# Patient Record
Sex: Female | Born: 1940 | Race: White | Hispanic: No | Marital: Single | Smoking: Never smoker
Health system: Southern US, Community
[De-identification: ages and names within clinical notes are randomized; demographics above are authoritative.]

## PROBLEM LIST (undated history)

## (undated) DIAGNOSIS — I471 Supraventricular tachycardia, unspecified: Secondary | ICD-10-CM

## (undated) DIAGNOSIS — I4891 Unspecified atrial fibrillation: Secondary | ICD-10-CM

## (undated) DIAGNOSIS — C801 Malignant (primary) neoplasm, unspecified: Secondary | ICD-10-CM

## (undated) DIAGNOSIS — I10 Essential (primary) hypertension: Secondary | ICD-10-CM

## (undated) DIAGNOSIS — M1990 Unspecified osteoarthritis, unspecified site: Secondary | ICD-10-CM

## (undated) DIAGNOSIS — L570 Actinic keratosis: Secondary | ICD-10-CM

## (undated) DIAGNOSIS — H409 Unspecified glaucoma: Secondary | ICD-10-CM

## (undated) DIAGNOSIS — T7840XA Allergy, unspecified, initial encounter: Secondary | ICD-10-CM

## (undated) HISTORY — PX: TOOTH EXTRACTION: SUR596

## (undated) HISTORY — DX: Allergy, unspecified, initial encounter: T78.40XA

## (undated) HISTORY — PX: EYE SURGERY: SHX253

## (undated) HISTORY — DX: Unspecified glaucoma: H40.9

## (undated) HISTORY — DX: Unspecified atrial fibrillation: I48.91

## (undated) HISTORY — DX: Actinic keratosis: L57.0

## (undated) HISTORY — DX: Supraventricular tachycardia, unspecified: I47.10

## (undated) HISTORY — DX: Unspecified osteoarthritis, unspecified site: M19.90

## (undated) HISTORY — PX: TONSILLECTOMY: SUR1361

## (undated) HISTORY — DX: Essential (primary) hypertension: I10

---

## 2005-04-22 ENCOUNTER — Ambulatory Visit: Payer: Self-pay | Admitting: Family Medicine

## 2005-05-24 ENCOUNTER — Ambulatory Visit: Payer: Self-pay | Admitting: Family Medicine

## 2005-06-22 ENCOUNTER — Ambulatory Visit: Payer: Self-pay | Admitting: Family Medicine

## 2005-06-22 ENCOUNTER — Other Ambulatory Visit: Admission: RE | Admit: 2005-06-22 | Discharge: 2005-06-22 | Payer: Self-pay | Admitting: Family Medicine

## 2005-06-22 ENCOUNTER — Encounter: Payer: Self-pay | Admitting: Family Medicine

## 2005-07-19 ENCOUNTER — Ambulatory Visit: Payer: Self-pay | Admitting: Family Medicine

## 2005-09-21 ENCOUNTER — Ambulatory Visit: Payer: Self-pay | Admitting: Family Medicine

## 2005-11-28 ENCOUNTER — Ambulatory Visit: Payer: Self-pay | Admitting: Family Medicine

## 2005-12-22 ENCOUNTER — Ambulatory Visit: Payer: Self-pay | Admitting: Family Medicine

## 2006-06-21 ENCOUNTER — Ambulatory Visit: Payer: Self-pay | Admitting: Family Medicine

## 2006-10-03 ENCOUNTER — Ambulatory Visit: Payer: Self-pay | Admitting: Family Medicine

## 2006-10-05 ENCOUNTER — Ambulatory Visit: Payer: Self-pay | Admitting: Family Medicine

## 2006-10-05 LAB — CONVERTED CEMR LAB
AST: 28 units/L (ref 0–37)
Albumin: 4 g/dL (ref 3.5–5.2)
Basophils Absolute: 0 10*3/uL (ref 0.0–0.1)
Basophils Relative: 0.2 % (ref 0.0–1.0)
Bilirubin, Direct: 0.1 mg/dL (ref 0.0–0.3)
Chloride: 105 meq/L (ref 96–112)
Cholesterol: 232 mg/dL (ref 0–200)
Creatinine, Ser: 0.8 mg/dL (ref 0.4–1.2)
Direct LDL: 133.9 mg/dL
Glucose, Bld: 92 mg/dL (ref 70–99)
HDL: 72.2 mg/dL (ref >39.0)
Lymphocytes Relative: 28.8 % (ref 12.0–46.0)
Monocytes Relative: 8.1 % (ref 3.0–11.0)
Platelets: 243 10*3/uL (ref 150–400)
Potassium: 4 meq/L (ref 3.5–5.1)
Sodium: 141 meq/L (ref 135–145)
TSH: 2.54 microintl units/mL (ref 0.35–5.50)
Total Bilirubin: 0.5 mg/dL (ref 0.3–1.2)
Total CHOL/HDL Ratio: 3.2
Total Protein: 6.4 g/dL (ref 6.0–8.3)
Triglycerides: 69 mg/dL (ref 0–149)
VLDL: 14 mg/dL (ref 0–40)

## 2006-10-24 LAB — FECAL OCCULT BLOOD, GUAIAC: Fecal Occult Blood: NEGATIVE

## 2006-10-26 ENCOUNTER — Ambulatory Visit: Payer: Self-pay | Admitting: Family Medicine

## 2007-06-22 ENCOUNTER — Ambulatory Visit: Payer: Self-pay | Admitting: Family Medicine

## 2007-10-03 ENCOUNTER — Encounter: Payer: Self-pay | Admitting: Family Medicine

## 2007-10-03 DIAGNOSIS — E7800 Pure hypercholesterolemia, unspecified: Secondary | ICD-10-CM | POA: Insufficient documentation

## 2007-10-03 DIAGNOSIS — M810 Age-related osteoporosis without current pathological fracture: Secondary | ICD-10-CM | POA: Insufficient documentation

## 2007-10-03 DIAGNOSIS — I10 Essential (primary) hypertension: Secondary | ICD-10-CM | POA: Insufficient documentation

## 2007-10-03 DIAGNOSIS — H409 Unspecified glaucoma: Secondary | ICD-10-CM | POA: Insufficient documentation

## 2007-10-03 DIAGNOSIS — J309 Allergic rhinitis, unspecified: Secondary | ICD-10-CM | POA: Insufficient documentation

## 2007-10-03 DIAGNOSIS — M1990 Unspecified osteoarthritis, unspecified site: Secondary | ICD-10-CM | POA: Insufficient documentation

## 2007-10-11 ENCOUNTER — Ambulatory Visit: Payer: Self-pay | Admitting: Family Medicine

## 2007-10-12 LAB — CONVERTED CEMR LAB
ALT: 20 units/L (ref 0–35)
BUN: 16 mg/dL (ref 6–23)
Basophils Relative: 0.3 % (ref 0.0–1.0)
CO2: 32 meq/L (ref 19–32)
Creatinine, Ser: 0.8 mg/dL (ref 0.4–1.2)
Eosinophils Absolute: 0.2 10*3/uL (ref 0.0–0.6)
Eosinophils Relative: 2.4 % (ref 0.0–5.0)
Glucose, Bld: 100 mg/dL — ABNORMAL HIGH (ref 70–99)
HCT: 40.9 % (ref 36.0–46.0)
Hemoglobin: 13.8 g/dL (ref 12.0–15.0)
Lymphocytes Relative: 19.1 % (ref 12.0–46.0)
MCV: 91.5 fL (ref 78.0–100.0)
Monocytes Absolute: 0.9 10*3/uL — ABNORMAL HIGH (ref 0.2–0.7)
Neutro Abs: 4.1 10*3/uL (ref 1.4–7.7)
Neutrophils Relative %: 64.3 % (ref 43.0–77.0)
Phosphorus: 3.8 mg/dL (ref 2.3–4.6)
Platelets: 202 10*3/uL (ref 150–400)
Potassium: 3.6 meq/L (ref 3.5–5.1)
TSH: 2.04 microintl units/mL (ref 0.35–5.50)
VLDL: 10 mg/dL (ref 0–40)
WBC: 6.4 10*3/uL (ref 4.5–10.5)

## 2007-10-15 LAB — CONVERTED CEMR LAB: Vit D, 1,25-Dihydroxy: 40 (ref 30–89)

## 2007-10-16 ENCOUNTER — Ambulatory Visit: Payer: Self-pay | Admitting: Family Medicine

## 2007-10-16 LAB — CONVERTED CEMR LAB
Bacteria, UA: 0
Ketones, urine, test strip: NEGATIVE
Nitrite: NEGATIVE
Protein, U semiquant: NEGATIVE
Specific Gravity, Urine: 1.01

## 2007-11-22 ENCOUNTER — Encounter: Payer: Self-pay | Admitting: Family Medicine

## 2007-11-28 ENCOUNTER — Encounter (INDEPENDENT_AMBULATORY_CARE_PROVIDER_SITE_OTHER): Payer: Self-pay | Admitting: *Deleted

## 2008-06-02 ENCOUNTER — Ambulatory Visit: Payer: Self-pay | Admitting: Family Medicine

## 2008-12-18 ENCOUNTER — Encounter: Payer: Self-pay | Admitting: Family Medicine

## 2008-12-18 ENCOUNTER — Other Ambulatory Visit: Admission: RE | Admit: 2008-12-18 | Discharge: 2008-12-18 | Payer: Self-pay | Admitting: Family Medicine

## 2008-12-18 ENCOUNTER — Ambulatory Visit: Payer: Self-pay | Admitting: Family Medicine

## 2008-12-18 DIAGNOSIS — R002 Palpitations: Secondary | ICD-10-CM | POA: Insufficient documentation

## 2008-12-23 ENCOUNTER — Encounter (INDEPENDENT_AMBULATORY_CARE_PROVIDER_SITE_OTHER): Payer: Self-pay | Admitting: *Deleted

## 2008-12-23 ENCOUNTER — Ambulatory Visit: Payer: Self-pay | Admitting: Family Medicine

## 2008-12-24 LAB — CONVERTED CEMR LAB
AST: 25 units/L (ref 0–37)
Basophils Relative: 0.5 % (ref 0.0–3.0)
Creatinine, Ser: 0.8 mg/dL (ref 0.4–1.2)
Direct LDL: 145.4 mg/dL
Eosinophils Absolute: 0.1 10*3/uL (ref 0.0–0.7)
Eosinophils Relative: 2.4 % (ref 0.0–5.0)
Glucose, Bld: 90 mg/dL (ref 70–99)
Lymphocytes Relative: 32.7 % (ref 12.0–46.0)
Monocytes Relative: 9 % (ref 3.0–12.0)
Neutrophils Relative %: 55.4 % (ref 43.0–77.0)
Phosphorus: 4.9 mg/dL — ABNORMAL HIGH (ref 2.3–4.6)
Potassium: 4.1 meq/L (ref 3.5–5.1)
RBC: 4.33 M/uL (ref 3.87–5.11)
Sodium: 144 meq/L (ref 135–145)
Total CHOL/HDL Ratio: 4
VLDL: 15.4 mg/dL (ref 0.0–40.0)
WBC: 4.2 10*3/uL — ABNORMAL LOW (ref 4.5–10.5)

## 2008-12-25 ENCOUNTER — Ambulatory Visit: Payer: Self-pay | Admitting: Family Medicine

## 2008-12-25 LAB — CONVERTED CEMR LAB
OCCULT 1: NEGATIVE
OCCULT 2: NEGATIVE
OCCULT 3: NEGATIVE

## 2008-12-26 ENCOUNTER — Encounter (INDEPENDENT_AMBULATORY_CARE_PROVIDER_SITE_OTHER): Payer: Self-pay | Admitting: *Deleted

## 2008-12-26 LAB — CONVERTED CEMR LAB: Vit D, 25-Hydroxy: 43 ng/mL (ref 30–89)

## 2009-01-05 ENCOUNTER — Encounter: Payer: Self-pay | Admitting: Family Medicine

## 2009-01-13 ENCOUNTER — Encounter (INDEPENDENT_AMBULATORY_CARE_PROVIDER_SITE_OTHER): Payer: Self-pay | Admitting: *Deleted

## 2009-06-10 ENCOUNTER — Ambulatory Visit: Payer: Self-pay | Admitting: Family Medicine

## 2010-01-22 ENCOUNTER — Ambulatory Visit: Payer: Self-pay | Admitting: Family Medicine

## 2010-01-22 DIAGNOSIS — K589 Irritable bowel syndrome without diarrhea: Secondary | ICD-10-CM | POA: Insufficient documentation

## 2010-01-26 LAB — CONVERTED CEMR LAB
Albumin: 4.2 g/dL (ref 3.5–5.2)
Basophils Relative: 0.7 % (ref 0.0–3.0)
Chloride: 102 meq/L (ref 96–112)
Direct LDL: 171.9 mg/dL
Eosinophils Absolute: 0.1 10*3/uL (ref 0.0–0.7)
Hemoglobin: 13.3 g/dL (ref 12.0–15.0)
Lymphocytes Relative: 31.6 % (ref 12.0–46.0)
MCHC: 34.4 g/dL (ref 30.0–36.0)
Monocytes Relative: 11.7 % (ref 3.0–12.0)
Neutro Abs: 2.7 10*3/uL (ref 1.4–7.7)
Phosphorus: 3.9 mg/dL (ref 2.3–4.6)
Potassium: 3.8 meq/L (ref 3.5–5.1)
RBC: 4.27 M/uL (ref 3.87–5.11)
TSH: 3.11 microintl units/mL (ref 0.35–5.50)
VLDL: 30.4 mg/dL (ref 0.0–40.0)
Vit D, 25-Hydroxy: 47 ng/mL (ref 30–89)

## 2010-02-08 ENCOUNTER — Encounter: Payer: Self-pay | Admitting: Family Medicine

## 2010-02-08 LAB — HM MAMMOGRAPHY: HM Mammogram: NORMAL

## 2010-02-10 ENCOUNTER — Encounter (INDEPENDENT_AMBULATORY_CARE_PROVIDER_SITE_OTHER): Payer: Self-pay | Admitting: *Deleted

## 2010-03-11 ENCOUNTER — Encounter: Payer: Self-pay | Admitting: Family Medicine

## 2010-03-15 ENCOUNTER — Encounter: Payer: Self-pay | Admitting: Family Medicine

## 2010-03-15 ENCOUNTER — Ambulatory Visit: Payer: Self-pay | Admitting: Gastroenterology

## 2010-06-02 ENCOUNTER — Ambulatory Visit: Payer: Self-pay | Admitting: Ophthalmology

## 2010-07-07 ENCOUNTER — Ambulatory Visit: Payer: Self-pay | Admitting: Ophthalmology

## 2010-08-09 ENCOUNTER — Ambulatory Visit: Payer: Self-pay | Admitting: Family Medicine

## 2010-08-09 DIAGNOSIS — M79609 Pain in unspecified limb: Secondary | ICD-10-CM | POA: Insufficient documentation

## 2010-08-10 LAB — CONVERTED CEMR LAB
BUN: 24 mg/dL — ABNORMAL HIGH (ref 6–23)
Calcium: 9.6 mg/dL (ref 8.4–10.5)
GFR calc non Af Amer: 79.05 mL/min
Glucose, Bld: 69 mg/dL — ABNORMAL LOW (ref 70–99)
Potassium: 4.1 meq/L (ref 3.5–5.1)

## 2010-10-05 NOTE — Assessment & Plan Note (Signed)
Summary: RIGHT KNEE PAIN/CLE   Vital Signs:  Patient profile:   70 year old female Weight:      190.50 pounds Temp:     98.3 degrees F oral Pulse rate:   76 / minute Pulse rhythm:   regular BP sitting:   128 / 72  (left arm) Cuff size:   large  Vitals Entered By: Selena Batten Dance CMA Duncan Dull) (August 09, 2010 9:09 AM) CC: Right knee/leg pain   History of Present Illness: CC: R leg pain  2 wks ago woke her up from sleep, sharp pain, like "leg locked up in spasm".  Hurt posterior leg, started thigh region and travelled down to ankle, up to hips at times.  Inactivity brings it on.  Worse in AM when getting up to move leg.  90% better now.  Has tried advil and alleve.  h/o arthritis.  pain described as sharp throbbing then goes down to just ache.  Not really cramping.  No erythema, swelling or redness of leg.  No back pain.  + sometimes with muscle spasm.  No pain currently  No falls/injuries, no fevers/chills, no knee pain, other joint pains.  No SOB, CP/tightness.  No recent immobility.    Recent colonoscopy and mammogram normal this year.  -  Date:  06/09/2010    Flu vaccine given at work  Current Medications (verified): 1)  Ziac 2.5-6.25 Mg  Tabs (Bisoprolol-Hydrochlorothiazide) .... One By Mouth Once Daily 2)  Verapamil Hcl Cr 240 Mg  Cp24 (Verapamil Hcl) .... One By Mouth Daily 3)  Evista 60 Mg  Tabs (Raloxifene Hcl) .... Take One By Mouth Daily 4)  Timolol Maleate 0.5 %  Solg (Timolol Maleate) .... Use As Directed 5)  Adult Aspirin Ec Low Strength 81 Mg  Tbec (Aspirin) .... One By Mouth Daily 6)  Calcium Plus Vitamin D 600 Mg .... 2 By Mouth Qd 7)  Fish Oil 1000mg  .... 1 By Mouth Qd 8)  Daily Vitamins   Tabs (Multiple Vitamin) .... Take 1 Tablet By Mouth Once A Day 9)  Loratadine 10 Mg Tabs (Loratadine) .... Otc As Directed.  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  no midline tenderness or paraspinous mm tenderness.  No SI or GTB pain bilaterally.    R leg nontender throughout, no masses, deformity.  slight crepitus at knees.  No pain with int/ext rotation at hip, full flexion/extension of knee.  Neg homan's sign.   Pulses:  2+ pulses Extremities:  No evidence of superficial thrombophlebitis.  No swelling, warmth, erythema or edema bilaterally Neurologic:  sensation intact to light touch, gait normal.   Impression & Recommendations:  Problem # 1:  LEG PAIN, RIGHT (ICD-729.5) ? hamstring strain vs mild sciatic irritation.  check K to r/o hypokalemia as on HCTZ.  Advised conservative measures for now (NSAIDs, flexeril, ice/heat).  If not better, consider referral to PT.  If worsening, return for further eval.  No significant risk factors for DVT other than weight, no evidence of DVT  on exam.  no back or buttock pain.  Orders: TLB-BMP (Basic Metabolic Panel-BMET) (80048-METABOL)  Complete Medication List: 1)  Ziac 2.5-6.25 Mg Tabs (Bisoprolol-hydrochlorothiazide) .... One by mouth once daily 2)  Verapamil Hcl Cr 240 Mg Cp24 (Verapamil hcl) .... One by mouth daily 3)  Evista 60 Mg Tabs (Raloxifene hcl) .... Take one by mouth daily 4)  Timolol Maleate 0.5 % Solg (Timolol maleate) .... Use as directed 5)  Adult Aspirin Ec Low Strength 81 Mg Tbec (Aspirin) .... One by mouth daily 6)  Calcium Plus Vitamin D 600 Mg  .... 2 by mouth qd 7)  Fish Oil 1000mg   .... 1 by mouth qd 8)  Daily  Vitamins Tabs (Multiple vitamin) .... Take 1 tablet by mouth once a day 9)  Loratadine 10 Mg Tabs (Loratadine) .... Otc as directed. 10)  Flexeril 5 Mg Tabs (Cyclobenzaprine hcl) .... Take one by mouth two times a day as needed muscle spasm, sedation precautions  Patient Instructions: 1)  Could be hamstring strain or possibly sciatica irritation. 2)  Treat with continued NSAIDs (advil and alleve), heat or ice (whichever soothes better), and massage. 3)  If not improving with these measures, we may send you to PT. 4)  If worsening, please return to be seen again. 5)  Check potassium level today. Prescriptions: FLEXERIL 5 MG TABS (CYCLOBENZAPRINE HCL) take one by mouth two times a day as needed muscle spasm, sedation precautions  #30 x 0   Entered and Authorized by:   Eustaquio Boyden  MD   Signed by:   Eustaquio Boyden  MD on 08/09/2010   Method used:   Electronically to        [facility] [address] [address]* (retail)       [address]       [place], [place]  [address]       Ph: [phone]       Fax: [phone]   RxID:   [id number]    Orders Added: 1)  Est. Patient Level III [47425] 2)  TLB-BMP (Basic Metabolic Panel-BMET) [80048-METABOL]    Current Allergies (reviewed today): No known allergies

## 2010-10-05 NOTE — Procedures (Signed)
Summary: Colonoscopy by Dr.Paul [facility]  Colonoscopy by Dr.Paul Oh,[facility]   Imported By: Beau Fanny 03/17/2010 16:41:19  _____________________________________________________________________  External Attachment:    Type:   Image     Comment:   External Document  Appended Document: Colonoscopy by Dr.Paul [facility]    Clinical Lists Changes  Observations: Added new observation of COLONNXTDUE: 03/2020 (03/17/2010 21:23) Added new observation of COLONOSCOPY: normal (03/15/2010 21:23)       Preventive Care Screening  Colonoscopy:    Date:  03/15/2010    Next Due:  03/2020    Results:  normal

## 2010-10-05 NOTE — Consult Note (Signed)
Summary: [facility]  [facility]   Imported By: Lanelle Bal 03/23/2010 11:12:05  _____________________________________________________________________  External Attachment:    Type:   Image     Comment:   External Document

## 2010-10-05 NOTE — Assessment & Plan Note (Signed)
Summary: cpx/alc   Vital Signs:  Patient profile:   70 year old female Height:      63 inches Weight:      184.50 pounds BMI:     32.80 Temp:     97.9 degrees F oral Pulse rate:   64 / minute Pulse rhythm:   regular BP sitting:   136 / 86  (left arm) Cuff size:   regular  Vitals Entered By: Lewanda Rife LPN (Jan 22, 2010 9:43 AM) CC: check up - multiple probs    History of Present Illness: here for f/u of chronic medical problems and to [facility] mt list   has been feeling good  bowel movements are different -- some cramps / occ constipation and sometimes nl she does not know why occ eating out makes it worse with diarrhea  ? IBS   wt is up 4 lb  HTN - bp 136/86 today- fairly controlled  OP stable last ma with FN T score -2.81 on evista and ca and D no problems with evista -- and compliant with ca and D  colonosc 01-- is due for 10 year f/u  pap 4/10 - nl , no hx of sympt / abn paps or new partners   mam neg 5/10  no new changes on breast exa   Td 04  ?pneumovax-- never had pneumonia  ? zostavax -- called about that with insurance -- is unsure if either would be covered    Allergies (verified): No Known Drug Allergies  Past History:  Past Medical History: Last updated: 10/03/2007 Allergic rhinitis Hypertension Osteoarthritis Osteoporosis  Past Surgical History: Last updated: 10/03/2007 Tonsillectomy Stress test/ holter (2004) Colonoscopy  Family History: Last updated: 10/27/07 Father: died age 64- heart problems, MI, kidney cancer Mother: OP and dementia Siblings: brother with HTN MGF CAD [place] CAD MGM CVA PGM died of cancer  Social History: Last updated: 10/27/07 Marital Status: single Children: 2 daughters 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 Eyes:  Denies blurring and eye irritation. CV:  Denies chest pain or  discomfort, lightheadness, and palpitations. Resp:  Denies cough, shortness of breath, and wheezing. GI:  Complains of abdominal pain, change in bowel habits, constipation, and diarrhea; denies bloody stools, indigestion, and nausea. GU:  Denies dysuria and urinary frequency. Derm:  Denies lesion(s), poor wound healing, and rash. Neuro:  Denies numbness and tingling. Endo:  Denies cold intolerance and heat intolerance. Heme:  Denies abnormal bruising, bleeding, and enlarge lymph nodes.  Physical Exam  General:  overweight but generally well appearing  Head:  normocephalic, atraumatic, and no abnormalities observed.   Eyes:  vision grossly intact, pupils equal, pupils round, and pupils reactive to light.  no conjunctival pallor, injection or icterus  Ears:  R ear normal and L ear normal.   Nose:  no nasal discharge.   Mouth:  pharynx pink and moist.   Neck:  supple with full rom and no masses or thyromegally, no JVD or carotid bruit  Chest Wall:  No deformities, masses, or tenderness noted. Breasts:  No mass, nodules, thickening, tenderness, bulging, retraction, inflamation, nipple discharge or skin changes noted.   Lungs:  Normal respiratory effort, chest expands symmetrically. Lungs are clear to auscultation, no crackles or wheezes. Heart:  Normal rate and regular rhythm. S1 and S2 normal without gallop, murmur, click, rub or other extra sounds. Abdomen:  Bowel sounds positive,abdomen soft and  non-tender without masses, organomegaly or hernias noted. no renal bruits  Msk:  No deformity or scoliosis noted of thoracic or lumbar spine.  no acute joint changes Pulses:  R and L carotid,radial,femoral,dorsalis pedis and posterior tibial pulses are full and equal bilaterally Extremities:  No clubbing, cyanosis, edema, or deformity noted with normal full range of motion of all joints.   Neurologic:  sensation intact to light touch, gait normal, and DTRs symmetrical and normal.   Skin:  Intact  without suspicious lesions or rashes lentigos diffusely  Cervical Nodes:  No lymphadenopathy noted Axillary Nodes:  No palpable lymphadenopathy Inguinal Nodes:  No significant adenopathy Psych:  normal affect, talkative and pleasant    Impression & Recommendations:  Problem # 1:  OTHER SCREENING MAMMOGRAM (ICD-V76.12) Assessment Comment Only annual mammogram scheduled adv pt to continue regular self breast exams non remarkable breast exam today  Orders: Venipuncture ([address]) TLB-Lipid Panel (80061-LIPID) TLB-Renal Function Panel (80069-RENAL) TLB-CBC Platelet - w/Differential (85025-CBCD) TLB-ALT (SGPT) (84460-ALT) TLB-AST (SGOT) (84450-SGOT) TLB-TSH (Thyroid Stimulating Hormone) (84443-TSH) T-Vitamin D (25-Hydroxy) (52841-32440) Radiology Referral (Radiology) Prescription Created Electronically [id number])  Problem # 2:  HYPERCHOLESTEROLEMIA (ICD-272.0) Assessment: Unchanged  check lipids with diet  low sat fat diet and exercise reviewed  Orders: Venipuncture (53664) TLB-Lipid Panel (80061-LIPID) TLB-Renal Function Panel (80069-RENAL) TLB-CBC Platelet - w/Differential (85025-CBCD) TLB-ALT (SGPT) (84460-ALT) TLB-AST (SGOT) (84450-SGOT) TLB-TSH (Thyroid Stimulating Hormone) (84443-TSH) T-Vitamin D (25-Hydroxy) (40347-42595) Prescription Created Electronically [id number])  Labs Reviewed: SGOT: 25 (12/23/2008)   SGPT: 20 (12/23/2008)   HDL:64.80 (12/23/2008), 75.3 (10/11/2007)  LDL:DEL (10/11/2007), DEL (10/05/2006)  Chol:233 (12/23/2008), 212 (10/11/2007)  Trig:77.0 (12/23/2008), 51 (10/11/2007)  Problem # 3:  OSTEOPOROSIS (ICD-733.00) Assessment: Unchanged this is stable (dexa) wiht evista and ca and D  check D level urged to keep up exercise  Her updated medication list for this problem includes:    Evista 60 Mg Tabs (Raloxifene hcl) .Marland Kitchen... Take one by mouth daily  Orders: Venipuncture ([address]) TLB-Lipid Panel (80061-LIPID) TLB-Renal Function Panel  (80069-RENAL) TLB-CBC Platelet - w/Differential (85025-CBCD) TLB-ALT (SGPT) (84460-ALT) TLB-AST (SGOT) (84450-SGOT) TLB-TSH (Thyroid Stimulating Hormone) (84443-TSH) T-Vitamin D (25-Hydroxy) (95188-41660) Prescription Created Electronically [id number])  Problem # 4:  HYPERTENSION (ICD-401.9) Assessment: Unchanged  bp in fair control reminded to work on healthy habits and wt loss  sent px to pharm to file Her updated medication list for this problem includes:    Ziac 2.5-6.25 Mg Tabs (Bisoprolol-hydrochlorothiazide) ..... One by mouth once daily    Verapamil Hcl Cr 240 Mg Cp24 (Verapamil hcl) ..... One by mouth daily  Orders: Venipuncture (01093) TLB-Lipid Panel (80061-LIPID) TLB-Renal Function Panel (80069-RENAL) TLB-CBC Platelet - w/Differential (85025-CBCD) TLB-ALT (SGPT) (84460-ALT) TLB-AST (SGOT) (84450-SGOT) TLB-TSH (Thyroid Stimulating Hormone) (84443-TSH) T-Vitamin D (25-Hydroxy) (23557-32202) Prescription Created Electronically [id number])  BP today: 136/86 Prior BP: 138/80 (12/18/2008)  Labs Reviewed: K+: 4.1 (12/23/2008) Creat: : 0.8 (12/23/2008)   Chol: 233 (12/23/2008)   HDL: 64.80 (12/23/2008)   LDL: DEL (10/11/2007)   TG: 77.0 (12/23/2008)  Problem # 5:  IRRITABLE BOWEL SYNDROME (ICD-564.1) Assessment: New with intermittent urgent loose stool and constipation and cramps trial of fiber ref to GI for this and also to disc 10 y screen colonosc  handout given fromaafp as well to read  Orders: Gastroenterology Referral (GI) Prescription Created Electronically [id number])  Complete Medication List: 1)  Ziac 2.5-6.25 Mg Tabs (Bisoprolol-hydrochlorothiazide) .... One by mouth once daily 2)  Verapamil Hcl Cr 240 Mg Cp24 (Verapamil hcl) .... One by mouth daily 3)  Evista 60 Mg Tabs (Raloxifene hcl) .Marland KitchenMarland KitchenMarland Kitchen  Take one by mouth daily 4)  Timolol Maleate 0.5 % Solg (Timolol maleate) .... Use as directed 5)  Adult Aspirin Ec Low Strength 81 Mg Tbec (Aspirin) .... One by mouth  daily 6)  Calcium Plus Vitamin D 600 Mg  .... 2 by mouth qd 7)  Fish Oil 1000mg   .... 1 by mouth qd 8)  Daily Vitamins Tabs (Multiple vitamin) .... Take 1 tablet by mouth once a day 9)  Loratadine 10 Mg Tabs (Loratadine) .... Otc as directed.  Patient Instructions: 1)  call your insurance about pneumonia vaccine , and shingles vaccine  2)  we will do GI referral at check out  3)  we will do mammogram referral at check out  4)  take citrucel (fiber supplement over the counter) once daily with water as directed  5)  work on healthy diet and exercise  6)  labs today  Prescriptions: EVISTA 60 MG  TABS (RALOXIFENE HCL) take one by mouth daily  #90 x 3   Entered and Authorized by:   Judith Part MD   Signed by:   Judith Part MD on 01/22/2010   Method used:   Electronically to        [facility] [address] (retail)       [address]       [place], [place]  [address]       Ph: [phone]       Fax: [phone]   RxID:   [id number] VERAPAMIL HCL CR 240 MG  CP24 (VERAPAMIL HCL) one by mouth daily  #90 x 3   Entered and Authorized by:   Judith Part MD   Signed by:   Judith Part MD on 01/22/2010   Method used:   Electronically to        [facility] [address] (retail)       [address]       [place], [place]  [address]       Ph: [phone]       Fax: [phone]   RxID:   [id number] ZIAC 2.5-6.25 MG  TABS (BISOPROLOL-HYDROCHLOROTHIAZIDE) one by mouth once daily  #90 x 3   Entered and Authorized by:   Judith Part MD   Signed by:   Judith Part MD on 01/22/2010   Method used:   Electronically to        [facility] [address] (retail)       [address]       [place], [place]  [address]       Ph: [phone]       Fax: [phone]   RxID:   [id number]   Current Allergies (reviewed today): No known allergies

## 2010-10-05 NOTE — Miscellaneous (Signed)
  Clinical Lists Changes  Observations: Added new observation of MAMMO DUE: 02/09/11 (02/08/2010 11:06) Added new observation of MAMMOGRAM: Normal (02/08/2010 11:06)

## 2010-10-05 NOTE — Letter (Signed)
Summary: Results Follow up Letter  [facility]  [address] [place], [place] [address]   Phone: [phone]  Fax: [phone]    02/10/2010 MRN: [id number]    DANITA PROUD [address] [place], [place]  [address]    Dear Ms. Mantei,  The following are the results of your recent test(s):  Test         Result    Pap Smear:        Normal _____  Not Normal _____ Comments: ______________________________________________________ Cholesterol: LDL(Bad cholesterol):         Your goal is less than:         HDL (Good cholesterol):       Your goal is more than: Comments:  ______________________________________________________ Mammogram:        Normal __X___  Not Normal _____ Comments:  Yearly follow up is recommended.   ___________________________________________________________________ Hemoccult:        Normal _____  Not normal _______ Comments:    _____________________________________________________________________ Other Tests:    We routinely do not discuss normal results over the telephone.  If you desire a copy of the results, or you have any questions about this information we can discuss them at your next office visit.   Sincerely,   Marne A. Milinda Antis, M.D.  MAT:lsf

## 2011-01-21 NOTE — Assessment & Plan Note (Signed)
[facility]                                 ON-CALL NOTE   NORINE, REDDINGTON                          MRN:          [id number]  DATE:10/28/2006                            DOB:          [birth year]    Phone call at 9:22 a.m.Marland Kitchen   TELEPHONE NUMBER:  [phone].   The patient is sitting in the parking lot, I guess at [facility]  She  called in wondering why we were not there.  Chemira spoke to her, I  guess she is having some kind of infection and fever.  She was not able  to get through to her and she left a message, then called again about  10:20.  The patient said she is not driving up to [place].  We did  offer her an appointment here at our clinic this morning.     Karie Schwalbe, MD  Electronically Signed    RIL/MedQ  DD: 10/28/2006  DT: 10/28/2006  Job #: [id number]   cc:   Marne A. Milinda Antis, MD

## 2011-01-25 ENCOUNTER — Telehealth: Payer: Self-pay | Admitting: *Deleted

## 2011-01-25 MED ORDER — ZOSTER VACCINE LIVE 19400 UNT/0.65ML ~~LOC~~ SOLR: 0.6500 mL | Freq: Once | SUBCUTANEOUS | Status: AC

## 2011-01-25 NOTE — Telephone Encounter (Signed)
Pt is coming in in August for a physical and wants to get zostavax prior.  Her insurance company has told her to pick up a script here to take to a pharmacy.  She is asking for a script, please call when ready.

## 2011-01-25 NOTE — Telephone Encounter (Signed)
Px printed for pick up in IN box  

## 2011-01-26 NOTE — Telephone Encounter (Signed)
Left message for pt to call back.Prescription left at front desk.  

## 2011-01-27 NOTE — Telephone Encounter (Signed)
Patient notified as instructed by telephone. 

## 2011-02-17 ENCOUNTER — Other Ambulatory Visit: Payer: Self-pay | Admitting: Family Medicine

## 2011-02-17 NOTE — Telephone Encounter (Signed)
Pt already has CPX scheduled with Dr Milinda Antis 04/19/11.

## 2011-03-25 ENCOUNTER — Other Ambulatory Visit: Payer: Self-pay | Admitting: Family Medicine

## 2011-04-04 ENCOUNTER — Other Ambulatory Visit: Payer: Self-pay | Admitting: Family Medicine

## 2011-04-04 NOTE — Telephone Encounter (Signed)
[facility] electronically request refill for Verapamil 240mg  CR #[id number]. Pt already has CPX scheduled for 04/19/11.

## 2011-04-06 ENCOUNTER — Ambulatory Visit: Payer: Self-pay | Admitting: Family Medicine

## 2011-04-07 ENCOUNTER — Telehealth: Payer: Self-pay | Admitting: Family Medicine

## 2011-04-07 ENCOUNTER — Other Ambulatory Visit (INDEPENDENT_AMBULATORY_CARE_PROVIDER_SITE_OTHER): Payer: PRIVATE HEALTH INSURANCE

## 2011-04-07 DIAGNOSIS — I10 Essential (primary) hypertension: Secondary | ICD-10-CM

## 2011-04-07 DIAGNOSIS — M810 Age-related osteoporosis without current pathological fracture: Secondary | ICD-10-CM

## 2011-04-07 DIAGNOSIS — E7800 Pure hypercholesterolemia, unspecified: Secondary | ICD-10-CM

## 2011-04-07 LAB — COMPREHENSIVE METABOLIC PANEL
Alkaline Phosphatase: 55 U/L (ref 39–117)
CO2: 30 mEq/L (ref 19–32)
Creatinine, Ser: 0.8 mg/dL (ref 0.4–1.2)
GFR: 73.37 mL/min (ref >60.00)
Glucose, Bld: 101 mg/dL — ABNORMAL HIGH (ref 70–99)
Sodium: 143 mEq/L (ref 135–145)
Total Bilirubin: 0.6 mg/dL (ref 0.3–1.2)
Total Protein: 7 g/dL (ref 6.0–8.3)

## 2011-04-07 LAB — CBC WITH DIFFERENTIAL/PLATELET
Eosinophils Relative: 2.6 % (ref 0.0–5.0)
HCT: 41.6 % (ref 36.0–46.0)
Hemoglobin: 13.6 g/dL (ref 12.0–15.0)
Lymphs Abs: 1.4 10*3/uL (ref 0.7–4.0)
MCV: 91.8 fl (ref 78.0–100.0)
Monocytes Absolute: 0.4 10*3/uL (ref 0.1–1.0)
Monocytes Relative: 9.3 % (ref 3.0–12.0)
Neutro Abs: 2.1 10*3/uL (ref 1.4–7.7)
Platelets: 197 10*3/uL (ref 150.0–400.0)
RDW: 13.9 % (ref 11.5–14.6)
WBC: 4 10*3/uL — ABNORMAL LOW (ref 4.5–10.5)

## 2011-04-07 LAB — TSH: TSH: 3.11 u[IU]/mL (ref 0.35–5.50)

## 2011-04-07 LAB — LIPID PANEL
Cholesterol: 246 mg/dL — ABNORMAL HIGH (ref 0–200)
Total CHOL/HDL Ratio: 3
VLDL: 18.8 mg/dL (ref 0.0–40.0)

## 2011-04-07 LAB — LDL CHOLESTEROL, DIRECT: Direct LDL: 154.5 mg/dL

## 2011-04-07 NOTE — Telephone Encounter (Signed)
Message copied by Judy Pimple on Thu Apr 07, 2011  9:12 AM ------      Message from: Baldomero Lamy      Created: Mon Apr 04, 2011  9:53 AM      Regarding: cpx labs thurs       Please order  future cpx labs for pt's upcomming lab appt.      Thanks      Rodney Booze

## 2011-04-12 ENCOUNTER — Other Ambulatory Visit: Payer: Self-pay

## 2011-04-13 ENCOUNTER — Encounter: Payer: Self-pay | Admitting: Family Medicine

## 2011-04-19 ENCOUNTER — Encounter: Payer: Self-pay | Admitting: Family Medicine

## 2011-04-19 ENCOUNTER — Ambulatory Visit (INDEPENDENT_AMBULATORY_CARE_PROVIDER_SITE_OTHER): Payer: PRIVATE HEALTH INSURANCE | Admitting: Family Medicine

## 2011-04-19 DIAGNOSIS — M810 Age-related osteoporosis without current pathological fracture: Secondary | ICD-10-CM

## 2011-04-19 DIAGNOSIS — Z23 Encounter for immunization: Secondary | ICD-10-CM

## 2011-04-19 DIAGNOSIS — E7800 Pure hypercholesterolemia, unspecified: Secondary | ICD-10-CM

## 2011-04-19 DIAGNOSIS — I10 Essential (primary) hypertension: Secondary | ICD-10-CM

## 2011-04-19 DIAGNOSIS — Z1231 Encounter for screening mammogram for malignant neoplasm of breast: Secondary | ICD-10-CM

## 2011-04-19 MED ORDER — VERAPAMIL HCL ER 240 MG PO TBCR: 240.0000 mg | EXTENDED_RELEASE_TABLET | Freq: Every day | ORAL | Status: DC

## 2011-04-19 MED ORDER — BISOPROLOL-HYDROCHLOROTHIAZIDE 2.5-6.25 MG PO TABS: 1.0000 | ORAL_TABLET | Freq: Every day | ORAL | Status: DC

## 2011-04-19 NOTE — Patient Instructions (Signed)
Pneumonia vaccine today We will refer you for mammogram at check out  Stop your evista- you are done with it  Continue calcium and vitamin D We will also refer you for bone density test at check out  Aim for 5 days of exercise per week  Avoid red meat/ fried foods/ egg yolks/ fatty breakfast meats/ butter, cheese and high fat dairy/ and shellfish   Schedule fasting lab for 6 months for cholesterol

## 2011-04-19 NOTE — Progress Notes (Signed)
Subjective:    Patient ID: Monica Guerrero, female    DOB: [birth year], 70 y.o.   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 ) Has never had pneumonia in her life   Td 1/04  Mam 6/11 normal - has not had one yet  Wants to go to [facility]  Self exam no breast lumps   Pap 4/10  No gyn problems  No abn paps    OP dexa 5/10 with fn -2.81 lowest score On evista- for a long time -- over 5 years  Ca and D- is good about taking that  Vit D level is 39 Gets outdoors some   Exercise is hit or miss  Used to do it regularly  Does qualify for silver sneakers    HTN in good control 112/74  Lipids are high but improved  Lab Results  Component Value Date   CHOL 246* 04/07/2011   CHOL 258* 01/22/2010   CHOL 233* 12/23/2008   Lab Results  Component Value Date   HDL 70.40 04/07/2011   HDL 76.40 01/22/2010   HDL 91.47 12/23/2008   No results found for this basename: LDLCALC   Lab Results  Component Value Date   TRIG 94.0 04/07/2011   TRIG 152.0* 01/22/2010   TRIG 77.0 12/23/2008   Lab Results  Component Value Date   CHOLHDL 3 04/07/2011   CHOLHDL 3 01/22/2010   CHOLHDL 4 12/23/2008   Lab Results  Component Value Date   LDLDIRECT 154.5 04/07/2011   LDLDIRECT 171.9 01/22/2010   LDLDIRECT 145.4 12/23/2008    Eats a lot of ice cream and cheese  Seldom fried foods occas hamburger  More fish and chicken than red meat  Knows she could do better   Patient Active Problem List  Diagnoses  . HYPERCHOLESTEROLEMIA  . GLAUCOMA  .  HYPERTENSION  . ALLERGIC RHINITIS  . IRRITABLE BOWEL SYNDROME  . OSTEOARTHRITIS  . LEG PAIN, RIGHT  . OSTEOPOROSIS  . PALPITATIONS  . Other screening mammogram   Past Medical History  Diagnosis Date  . Allergy   . Hypertension   . Arthritis     Osteoarthritis  . Osteoporosis    Past Surgical History  Procedure Date  . Tonsillectomy   . Eye surgery     Bilateral cataract Surgery   History  Substance Use Topics  . Smoking status: Never Smoker   . Smokeless tobacco: Not on file  . Alcohol Use: Not on file   Family History  Problem Relation Age of Onset  . Dementia Mother   . Cancer Father     Kidney  . Heart disease Father   . Hypertension Brother   . Heart disease Maternal Uncle   . Stroke Maternal Grandmother   . Heart disease Maternal Grandfather   . Cancer Paternal Grandmother    No Known Allergies Current Outpatient Prescriptions on File Prior to Visit  Medication Sig Dispense Refill  .  aspirin 81 MG tablet Take 81 mg by mouth daily.        . calcium carbonate (OS-CAL) 600 MG TABS Take 600 mg by mouth 2 (two) times daily with a meal.        . fish oil-omega-3 fatty acids 1000 MG capsule Take 1,000 mg by mouth daily.        Marland Kitchen loratadine (CLARITIN) 10 MG tablet Take 10 mg by mouth as directed.        . timolol (BETIMOL) 0.5 % ophthalmic solution Place 1 drop into both eyes daily.       . cyclobenzaprine (FLEXERIL) 5 MG tablet Take 5 mg by mouth 2 (two) times daily as needed. As needed for muscle spasms, and sedation precautions.       . Multiple Vitamin (MULTIVITAMIN) tablet Take 1 tablet by mouth daily.              Review of Systems Review of Systems  Constitutional: Negative for fever, appetite change, fatigue and unexpected weight change.  Eyes: Negative for pain and visual disturbance.  Respiratory: Negative for cough and shortness of breath.   Cardiovascular: Negative.  For cp or sob or palpitations Gastrointestinal: Negative for nausea, diarrhea  and constipation.  Genitourinary: Negative for urgency and frequency.  Skin: Negative for pallor. or rash  Neurological: Negative for weakness, light-headedness, numbness and headaches.  Hematological: Negative for adenopathy. Does not bruise/bleed easily.  Psychiatric/Behavioral: Negative for dysphoric mood. The patient is not nervous/anxious.          Objective:   Physical Exam  Constitutional: She appears well-developed and well-nourished. No distress.       overwt and well appearing   HENT:  Head: Normocephalic and atraumatic.  Right Ear: External ear normal.  Left Ear: External ear normal.  Nose: Nose normal.  Mouth/Throat: Oropharynx is clear and moist.  Eyes: Conjunctivae and EOM are normal. Pupils are equal, round, and reactive to light.  Neck: Normal range of motion. Neck supple. No JVD present. Carotid bruit is not present. No thyromegaly present.  Cardiovascular: Normal rate, regular rhythm, normal heart sounds and intact distal pulses.   Pulmonary/Chest: Effort normal and breath sounds normal. No respiratory distress. She has no wheezes.  Abdominal: Soft. Bowel sounds are normal. She exhibits no distension and no mass. There is no tenderness.  Genitourinary: No breast swelling, tenderness, discharge or bleeding.  Musculoskeletal: Normal range of motion. She exhibits no edema and no tenderness.  Lymphadenopathy:    She has no cervical adenopathy.  Neurological: She is alert. She has normal reflexes. No cranial nerve deficit. Coordination normal.  Skin: Skin is warm and dry.  Psychiatric: She has a normal mood and affect.          Assessment & Plan:

## 2011-04-19 NOTE — Assessment & Plan Note (Signed)
On evista over 5 years so will stop it  On ca and D  D level in 30s Disc imp of exercise Ordered 2 y dexa Disc safety issues

## 2011-04-19 NOTE — Assessment & Plan Note (Signed)
Mam ordered  Pt has fam hx  Nl breast exam today Stressed imp of self exams

## 2011-04-19 NOTE — Assessment & Plan Note (Signed)
HTN in good control with ziac and calan (has been on both long term)  Disc sodium avoidance and exercise  Rev labs with pt

## 2011-04-19 NOTE — Assessment & Plan Note (Signed)
Reviewed labs with pt  Disc risks of high chol Goal for LDL is 130 or below  Rev low sat fat diet  Plan to re check it in 6 months - lab scheduled

## 2011-06-09 ENCOUNTER — Encounter: Payer: Self-pay | Admitting: Family Medicine

## 2011-06-09 ENCOUNTER — Ambulatory Visit: Payer: Self-pay | Admitting: Family Medicine

## 2011-06-09 LAB — HM DEXA SCAN

## 2011-06-17 ENCOUNTER — Encounter: Payer: Self-pay | Admitting: Family Medicine

## 2011-06-20 ENCOUNTER — Encounter: Payer: Self-pay | Admitting: *Deleted

## 2011-10-20 ENCOUNTER — Other Ambulatory Visit (INDEPENDENT_AMBULATORY_CARE_PROVIDER_SITE_OTHER): Payer: Medicare HMO

## 2011-10-20 DIAGNOSIS — E7800 Pure hypercholesterolemia, unspecified: Secondary | ICD-10-CM

## 2011-10-20 LAB — LIPID PANEL
Cholesterol: 251 mg/dL — ABNORMAL HIGH (ref 0–200)
Total CHOL/HDL Ratio: 4
Triglycerides: 82 mg/dL (ref 0.0–149.0)

## 2012-06-14 ENCOUNTER — Ambulatory Visit (INDEPENDENT_AMBULATORY_CARE_PROVIDER_SITE_OTHER): Payer: Medicare HMO

## 2012-06-14 DIAGNOSIS — Z23 Encounter for immunization: Secondary | ICD-10-CM

## 2012-06-20 ENCOUNTER — Other Ambulatory Visit: Payer: Self-pay | Admitting: Family Medicine

## 2012-06-28 ENCOUNTER — Other Ambulatory Visit: Payer: Self-pay | Admitting: Family Medicine

## 2012-06-30 ENCOUNTER — Other Ambulatory Visit: Payer: Self-pay | Admitting: Family Medicine

## 2012-07-30 ENCOUNTER — Telehealth: Payer: Self-pay | Admitting: Family Medicine

## 2012-07-30 DIAGNOSIS — I10 Essential (primary) hypertension: Secondary | ICD-10-CM

## 2012-07-30 DIAGNOSIS — E7800 Pure hypercholesterolemia, unspecified: Secondary | ICD-10-CM

## 2012-07-30 DIAGNOSIS — M810 Age-related osteoporosis without current pathological fracture: Secondary | ICD-10-CM

## 2012-07-30 NOTE — Telephone Encounter (Signed)
Message copied by Judy Pimple on Mon Jul 30, 2012  9:45 PM ------      Message from: Baldomero Lamy      Created: Thu Jul 19, 2012 12:53 PM      Regarding: Cpx labs 07/31/12 Tues       Please order  future cpx labs for pt's upcomming lab appt.      Thanks      Rodney Booze

## 2012-07-31 ENCOUNTER — Other Ambulatory Visit: Payer: Medicare HMO

## 2012-08-01 ENCOUNTER — Other Ambulatory Visit (INDEPENDENT_AMBULATORY_CARE_PROVIDER_SITE_OTHER): Payer: Medicare HMO

## 2012-08-01 DIAGNOSIS — M810 Age-related osteoporosis without current pathological fracture: Secondary | ICD-10-CM

## 2012-08-01 DIAGNOSIS — E7800 Pure hypercholesterolemia, unspecified: Secondary | ICD-10-CM

## 2012-08-01 DIAGNOSIS — I10 Essential (primary) hypertension: Secondary | ICD-10-CM

## 2012-08-01 LAB — CBC WITH DIFFERENTIAL/PLATELET
Basophils Absolute: 0 10*3/uL (ref 0.0–0.1)
Eosinophils Absolute: 0.2 10*3/uL (ref 0.0–0.7)
Lymphocytes Relative: 30.8 % (ref 12.0–46.0)
MCHC: 32.8 g/dL (ref 30.0–36.0)
MCV: 90.4 fl (ref 78.0–100.0)
Monocytes Absolute: 0.4 10*3/uL (ref 0.1–1.0)
Neutrophils Relative %: 55 % (ref 43.0–77.0)
Platelets: 238 10*3/uL (ref 150.0–400.0)
RBC: 4.8 Mil/uL (ref 3.87–5.11)
RDW: 13 % (ref 11.5–14.6)

## 2012-08-01 LAB — LIPID PANEL
HDL: 66.4 mg/dL (ref >39.00)
Total CHOL/HDL Ratio: 4
Triglycerides: 81 mg/dL (ref 0.0–149.0)

## 2012-08-01 LAB — COMPREHENSIVE METABOLIC PANEL
ALT: 19 U/L (ref 0–35)
AST: 26 U/L (ref 0–37)
Albumin: 4.3 g/dL (ref 3.5–5.2)
Alkaline Phosphatase: 71 U/L (ref 39–117)
Calcium: 9.6 mg/dL (ref 8.4–10.5)
Chloride: 104 mEq/L (ref 96–112)
Potassium: 3.7 mEq/L (ref 3.5–5.1)
Sodium: 141 mEq/L (ref 135–145)

## 2012-08-01 LAB — TSH: TSH: 2.86 u[IU]/mL (ref 0.35–5.50)

## 2012-08-02 LAB — VITAMIN D 25 HYDROXY (VIT D DEFICIENCY, FRACTURES): Vit D, 25-Hydroxy: 40 ng/mL (ref 30–89)

## 2012-08-08 ENCOUNTER — Encounter: Payer: Self-pay | Admitting: Family Medicine

## 2012-08-08 ENCOUNTER — Ambulatory Visit (INDEPENDENT_AMBULATORY_CARE_PROVIDER_SITE_OTHER): Payer: Medicare HMO | Admitting: Family Medicine

## 2012-08-08 VITALS — BP 126/72 | HR 59 | Temp 98.0°F | Ht 62.75 in | Wt 183.8 lb

## 2012-08-08 DIAGNOSIS — M810 Age-related osteoporosis without current pathological fracture: Secondary | ICD-10-CM

## 2012-08-08 DIAGNOSIS — Z23 Encounter for immunization: Secondary | ICD-10-CM

## 2012-08-08 DIAGNOSIS — Z1331 Encounter for screening for depression: Secondary | ICD-10-CM

## 2012-08-08 DIAGNOSIS — I10 Essential (primary) hypertension: Secondary | ICD-10-CM

## 2012-08-08 DIAGNOSIS — Z1231 Encounter for screening mammogram for malignant neoplasm of breast: Secondary | ICD-10-CM

## 2012-08-08 DIAGNOSIS — E7800 Pure hypercholesterolemia, unspecified: Secondary | ICD-10-CM

## 2012-08-08 MED ORDER — BISOPROLOL-HYDROCHLOROTHIAZIDE 2.5-6.25 MG PO TABS: 1.0000 | ORAL_TABLET | Freq: Every day | ORAL | Status: DC

## 2012-08-08 MED ORDER — VERAPAMIL HCL ER 240 MG PO TBCR: 240.0000 mg | EXTENDED_RELEASE_TABLET | Freq: Every day | ORAL | Status: DC

## 2012-08-08 NOTE — Assessment & Plan Note (Signed)
Lipids are going up  Pt declines med of any kind and will continue to work on diet Disc goals for lipids and reasons to control them Rev labs with pt Rev low sat fat diet in detail

## 2012-08-08 NOTE — Assessment & Plan Note (Signed)
Pt will call to schedule her mammo at [facility] Nl breast exam today  Encouraged monthly self exams

## 2012-08-08 NOTE — Progress Notes (Signed)
Subjective:    Patient ID: Monica Guerrero, female    DOB: 1941/05/30, 71 y.o.   MRN: [id number]  HPI Here for check up of chronic medical conditions and to review health mt list   Feels good- nothing new   Wt is down 2 lb Has been trying to watch her diet   mammo 10/12- went to [place]  Self exam -no lumps or changes  Gyn-- no symptoms / and no abn paps   Td is due- 10 year , is not around  Had her flu shot   colonosc 7/11 with 10 year recall No bowel changes or problems   Falls-none in the past year  Balance is pretty good   Dep screen Mood is ok-no depression at all   OP- had dexa 10/12 evista in past Stress fracture in foot/ toe fx was 40 years ago  D level ok at 40  bp is stable today  No cp or palpitations or headaches or edema  No side effects to medicines  BP Readings from Last 3 Encounters:  08/08/12 126/72  04/19/11 112/74  08/09/10 128/72     Lab Results  Component Value Date   CHOL 261* 08/01/2012   CHOL 251* 10/20/2011   CHOL 246* 04/07/2011   Lab Results  Component Value Date   HDL 66.40 08/01/2012   HDL 68.00 10/20/2011   HDL 40.98 04/07/2011   No results found for this basename: [facility]   Lab Results  Component Value Date   TRIG 81.0 08/01/2012   TRIG 82.0 10/20/2011   TRIG 94.0 04/07/2011   Lab Results  Component Value Date   CHOLHDL 4 08/01/2012   CHOLHDL 4 10/20/2011   CHOLHDL 3 04/07/2011   Lab Results  Component Value Date   LDLDIRECT 184.5 08/01/2012   LDLDIRECT 169.6 10/20/2011   LDLDIRECT 154.5 04/07/2011   did not f/u for her last labs - had planned 6 mo f/u  Was going to the gym for a while Did cut down on cheese and ice cream  Seldom fried foods  Red meat - about once per week   Patient Active Problem List  Diagnosis  . HYPERCHOLESTEROLEMIA  . GLAUCOMA  . HYPERTENSION  . ALLERGIC RHINITIS  . IRRITABLE BOWEL SYNDROME  . OSTEOARTHRITIS  . OSTEOPOROSIS  . Other screening mammogram   Past Medical History  Diagnosis Date   . Allergy   . Hypertension   . Arthritis     Osteoarthritis  . Osteoporosis    Past Surgical History  Procedure Date  . Tonsillectomy   . Eye surgery     Bilateral cataract Surgery   History  Substance Use Topics  . Smoking status: Never Smoker   . Smokeless tobacco: Not on file  . Alcohol Use: No   Family History  Problem Relation Age of Onset  . Dementia Mother   . Cancer Father     Kidney  . Heart disease Father   . Hypertension Brother   . Heart disease Maternal Uncle   . Stroke Maternal Grandmother   . Heart disease Maternal Grandfather   . Cancer Paternal Grandmother    No Known Allergies Current Outpatient Prescriptions on File Prior to Visit  Medication Sig Dispense Refill  . acetaminophen (TYLENOL ARTHRITIS PAIN) 650 MG CR tablet Take 650 mg by mouth every 8 (eight) hours as needed. Alternates with Ibuprofen.       Marland Kitchen aspirin 81 MG tablet Take 81 mg by mouth daily.        Marland Kitchen  bisoprolol-hydrochlorothiazide (ZIAC) 2.5-6.25 MG per tablet TAKE ONE TABLET BY MOUTH EVERY DAY  60 tablet  0  . calcium carbonate (OS-CAL) 600 MG TABS Take 600 mg by mouth 2 (two) times daily with a meal.        . chlorpheniramine (CHLOR-TRIMETON) 4 MG tablet Take 4 mg by mouth 2 (two) times daily as needed.        . fish oil-omega-3 fatty acids 1000 MG capsule Take 1,000 mg by mouth daily.        Marland Kitchen ibuprofen (ADVIL,MOTRIN) 200 MG tablet Take 200 mg by mouth every 6 (six) hours as needed. Alternates with Tylenol       . loratadine (CLARITIN) 10 MG tablet Take 10 mg by mouth as directed.        . timolol (BETIMOL) 0.5 % ophthalmic solution Place 1 drop into both eyes daily.       . verapamil (CALAN-SR) 240 MG CR tablet TAKE  ONE TABLET BY MOUTH NIGHTLY AT BEDTIME  90 tablet  0  . [DISCONTINUED] verapamil (CALAN-SR) 240 MG CR tablet TAKE  ONE TABLET BY MOUTH NIGHTLY AT BEDTIME  90 tablet  0      Review of Systems Review of Systems  Constitutional: Negative for fever, appetite change,  fatigue and unexpected weight change.  Eyes: Negative for pain and visual disturbance.  Respiratory: Negative for cough and shortness of breath.   Cardiovascular: Negative for cp or palpitations    Gastrointestinal: Negative for nausea, diarrhea and constipation.  Genitourinary: Negative for urgency and frequency.  Skin: Negative for pallor or rash   Neurological: Negative for weakness, light-headedness, numbness and headaches.  Hematological: Negative for adenopathy. Does not bruise/bleed easily.  Psychiatric/Behavioral: Negative for dysphoric mood. The patient is not nervous/anxious.         Objective:   Physical Exam  Constitutional: She appears well-developed and well-nourished. No distress.  HENT:  Head: Normocephalic and atraumatic.  Right Ear: External ear normal.  Left Ear: External ear normal.  Nose: Nose normal.  Mouth/Throat: Oropharynx is clear and moist.  Eyes: Conjunctivae normal and EOM are normal. Pupils are equal, round, and reactive to light. Right eye exhibits no discharge. Left eye exhibits no discharge. No scleral icterus.  Neck: Normal range of motion. Neck supple. No JVD present. Carotid bruit is not present. No thyromegaly present.  Cardiovascular: Normal rate, regular rhythm, normal heart sounds and intact distal pulses.  Exam reveals no gallop.   Pulmonary/Chest: Effort normal and breath sounds normal. No respiratory distress. She has no wheezes.  Abdominal: Soft. Bowel sounds are normal. She exhibits no distension, no abdominal bruit and no mass. There is no tenderness.  Genitourinary: No breast swelling, tenderness, discharge or bleeding.       Breast exam: No mass, nodules, thickening, tenderness, bulging, retraction, inflamation, nipple discharge or skin changes noted.  No axillary or clavicular LA.  Chaperoned exam.    Musculoskeletal: Normal range of motion. She exhibits no edema and no tenderness.  Lymphadenopathy:    She has no cervical adenopathy.   Neurological: She is alert. She has normal reflexes. No cranial nerve deficit. She exhibits normal muscle tone. Coordination normal.  Skin: Skin is warm and dry. No rash noted. No erythema. No pallor.  Psychiatric: She has a normal mood and affect.          Assessment & Plan:

## 2012-08-08 NOTE — Assessment & Plan Note (Signed)
dexa due in another year evista 5 y in the past No fx Vit D level is nl Enc to exercise

## 2012-08-08 NOTE — Patient Instructions (Addendum)
You are due for your yearly mammogram - so do not forget to call the [place] [facility] and schedule it  Cholesterol is up (Avoid red meat/ fried foods/ egg yolks/ fatty breakfast meats/ butter, cheese and high fat dairy/ and shellfish  ) Try to exercise regularly  I sent your medicines to your pharmacy Tetanus shot today

## 2012-08-08 NOTE — Assessment & Plan Note (Signed)
bp in fair control at this time  No changes needed  Disc lifstyle change with low sodium diet and exercise   Reviewed labs today 

## 2012-09-22 ENCOUNTER — Other Ambulatory Visit: Payer: Self-pay | Admitting: Family Medicine

## 2012-10-23 ENCOUNTER — Ambulatory Visit: Payer: Self-pay | Admitting: Family Medicine

## 2012-10-25 ENCOUNTER — Encounter: Payer: Self-pay | Admitting: Family Medicine

## 2012-10-26 ENCOUNTER — Encounter: Payer: Self-pay | Admitting: Family Medicine

## 2012-10-26 ENCOUNTER — Encounter: Payer: Self-pay | Admitting: *Deleted

## 2013-05-30 ENCOUNTER — Ambulatory Visit (INDEPENDENT_AMBULATORY_CARE_PROVIDER_SITE_OTHER): Payer: Medicare HMO

## 2013-05-30 DIAGNOSIS — Z23 Encounter for immunization: Secondary | ICD-10-CM

## 2013-09-08 ENCOUNTER — Telehealth: Payer: Self-pay | Admitting: Family Medicine

## 2013-09-08 DIAGNOSIS — M810 Age-related osteoporosis without current pathological fracture: Secondary | ICD-10-CM

## 2013-09-08 DIAGNOSIS — E7800 Pure hypercholesterolemia, unspecified: Secondary | ICD-10-CM

## 2013-09-08 DIAGNOSIS — I10 Essential (primary) hypertension: Secondary | ICD-10-CM

## 2013-09-08 NOTE — Telephone Encounter (Signed)
Message copied by Abner Greenspan on Sun Sep 08, 2013  7:36 PM ------      Message from: Ellamae Sia      Created: Fri Aug 30, 2013 10:34 AM      Regarding: Lab orders for 1.5.15       Patient is scheduled for CPX labs, please order future labs, Thanks , Terri       ------

## 2013-09-09 ENCOUNTER — Other Ambulatory Visit (INDEPENDENT_AMBULATORY_CARE_PROVIDER_SITE_OTHER): Payer: Medicare HMO

## 2013-09-09 DIAGNOSIS — I10 Essential (primary) hypertension: Secondary | ICD-10-CM

## 2013-09-09 DIAGNOSIS — M810 Age-related osteoporosis without current pathological fracture: Secondary | ICD-10-CM

## 2013-09-09 DIAGNOSIS — E7800 Pure hypercholesterolemia, unspecified: Secondary | ICD-10-CM

## 2013-09-09 LAB — COMPREHENSIVE METABOLIC PANEL
ALT: 23 U/L (ref 0–35)
AST: 26 U/L (ref 0–37)
Albumin: 4.3 g/dL (ref 3.5–5.2)
Alkaline Phosphatase: 55 U/L (ref 39–117)
BUN: 20 mg/dL (ref 6–23)
CO2: 29 mEq/L (ref 19–32)
CREATININE: 0.8 mg/dL (ref 0.4–1.2)
Calcium: 9.2 mg/dL (ref 8.4–10.5)
Chloride: 106 mEq/L (ref 96–112)
GFR: 78.35 mL/min (ref >60.00)
Glucose, Bld: 102 mg/dL — ABNORMAL HIGH (ref 70–99)
Potassium: 3.7 mEq/L (ref 3.5–5.1)
Sodium: 140 mEq/L (ref 135–145)
Total Bilirubin: 0.7 mg/dL (ref 0.3–1.2)
Total Protein: 6.7 g/dL (ref 6.0–8.3)

## 2013-09-09 LAB — CBC WITH DIFFERENTIAL/PLATELET
BASOS PCT: 0.7 % (ref 0.0–3.0)
Basophils Absolute: 0 10*3/uL (ref 0.0–0.1)
EOS PCT: 2.3 % (ref 0.0–5.0)
Eosinophils Absolute: 0.1 10*3/uL (ref 0.0–0.7)
HCT: 42.3 % (ref 36.0–46.0)
Hemoglobin: 14.2 g/dL (ref 12.0–15.0)
LYMPHS PCT: 31.9 % (ref 12.0–46.0)
Lymphs Abs: 1.3 10*3/uL (ref 0.7–4.0)
MCHC: 33.7 g/dL (ref 30.0–36.0)
MCV: 89.8 fl (ref 78.0–100.0)
Monocytes Absolute: 0.4 10*3/uL (ref 0.1–1.0)
Monocytes Relative: 8.8 % (ref 3.0–12.0)
NEUTROS ABS: 2.4 10*3/uL (ref 1.4–7.7)
Neutrophils Relative %: 56.3 % (ref 43.0–77.0)
Platelets: 224 10*3/uL (ref 150.0–400.0)
RBC: 4.71 Mil/uL (ref 3.87–5.11)
RDW: 13.8 % (ref 11.5–14.6)
WBC: 4.2 10*3/uL — ABNORMAL LOW (ref 4.5–10.5)

## 2013-09-09 LAB — LIPID PANEL
Cholesterol: 253 mg/dL — ABNORMAL HIGH (ref 0–200)
HDL: 63 mg/dL (ref >39.00)
Total CHOL/HDL Ratio: 4
Triglycerides: 91 mg/dL (ref 0.0–149.0)
VLDL: 18.2 mg/dL (ref 0.0–40.0)

## 2013-09-09 LAB — LDL CHOLESTEROL, DIRECT: LDL DIRECT: 175.7 mg/dL

## 2013-09-09 LAB — TSH: TSH: 2.42 u[IU]/mL (ref 0.35–5.50)

## 2013-09-10 LAB — VITAMIN D 25 HYDROXY (VIT D DEFICIENCY, FRACTURES): VIT D 25 HYDROXY: 45 ng/mL (ref 30–89)

## 2013-09-13 ENCOUNTER — Ambulatory Visit (INDEPENDENT_AMBULATORY_CARE_PROVIDER_SITE_OTHER): Payer: Medicare HMO | Admitting: Family Medicine

## 2013-09-13 ENCOUNTER — Encounter: Payer: Self-pay | Admitting: Family Medicine

## 2013-09-13 VITALS — BP 122/74 | HR 58 | Temp 98.2°F | Ht 63.0 in | Wt 187.5 lb

## 2013-09-13 DIAGNOSIS — Z111 Encounter for screening for respiratory tuberculosis: Secondary | ICD-10-CM

## 2013-09-13 DIAGNOSIS — Z0000 Encounter for general adult medical examination without abnormal findings: Secondary | ICD-10-CM

## 2013-09-13 DIAGNOSIS — M810 Age-related osteoporosis without current pathological fracture: Secondary | ICD-10-CM

## 2013-09-13 DIAGNOSIS — I10 Essential (primary) hypertension: Secondary | ICD-10-CM

## 2013-09-13 DIAGNOSIS — E7800 Pure hypercholesterolemia, unspecified: Secondary | ICD-10-CM

## 2013-09-13 MED ORDER — BISOPROLOL-HYDROCHLOROTHIAZIDE 2.5-6.25 MG PO TABS: 1.0000 | ORAL_TABLET | Freq: Every day | ORAL | Status: DC

## 2013-09-13 MED ORDER — VERAPAMIL HCL ER 240 MG PO TBCR: 240.0000 mg | EXTENDED_RELEASE_TABLET | Freq: Every day | ORAL | Status: DC

## 2013-09-13 NOTE — Progress Notes (Signed)
Pre-visit discussion using our clinic review tool. No additional management support is needed unless otherwise documented below in the visit note.  

## 2013-09-13 NOTE — Progress Notes (Signed)
Subjective:    Patient ID: Monica Guerrero, female    DOB: 1941-01-19, 73 y.o.   MRN: [id number]  HPI I have personally reviewed the Medicare Annual Wellness questionnaire and have noted 1. The patient's medical and social history 2. Their use of alcohol, tobacco or illicit drugs 3. Their current medications and supplements 4. The patient's functional ability including ADL's, fall risks, home safety risks and hearing or visual             impairment. 5. Diet and physical activities 6. Evidence for depression or mood disorders  The patients weight, height, BMI have been recorded in the chart and visual acuity is per eye clinic.  I have made referrals, counseling and provided education to the patient based review of the above and I have provided the pt with a written personalized care plan for preventive services.  Doing well   Wt is up 4 lb with bmi of 33  Knows she gained from the holidays  Getting back to the gym    See scanned forms.  Routine anticipatory guidance given to patient.  See health maintenance. Flu 9/14 vaccine  Shingles 6/12 vaccine  PNA 8/12 vaccine  Tetanus 12/13 vaccine  Colonosc 7/11  Breast cancer screening mammogram 2/14  Self exam - no lumps  Gyn- no problems  Advance directive- has a living will  Cognitive function addressed- see scanned forms- and if abnormal then additional documentation follows. - no problems   PMH and SH reviewed  Meds, vitals, and allergies reviewed.   ROS: See HPI.  Otherwise negative.    OP-due for 2 year dexa No fractures  D level is 45- is good with taking that   PPD for work today - placed   bp is stable today  No cp or palpitations or headaches or edema  No side effects to medicines  BP Readings from Last 3 Encounters:  09/13/13 122/74  08/08/12 126/72  04/19/11 112/74     Hyperlipidemia Lab Results  Component Value Date   CHOL 253* 09/09/2013   CHOL 261* 08/01/2012   CHOL 251* 10/20/2011   Lab Results    Component Value Date   HDL 63.00 09/09/2013   HDL 66.40 08/01/2012   HDL 68.00 10/20/2011   No results found for this basename: [facility]   Lab Results  Component Value Date   TRIG 91.0 09/09/2013   TRIG 81.0 08/01/2012   TRIG 82.0 10/20/2011   Lab Results  Component Value Date   CHOLHDL 4 09/09/2013   CHOLHDL 4 08/01/2012   CHOLHDL 4 10/20/2011   Lab Results  Component Value Date   LDLDIRECT 175.7 09/09/2013   LDLDIRECT 184.5 08/01/2012   LDLDIRECT 169.6 10/20/2011   not agreeable to cholesterol medicine (EVER)  She has cut back on red meat  Also no fried foods     Chemistry      Component Value Date/Time   NA 140 09/09/2013 0934   K 3.7 09/09/2013 0934   CL 106 09/09/2013 0934   CO2 29 09/09/2013 0934   BUN 20 09/09/2013 0934   CREATININE 0.8 09/09/2013 0934      Component Value Date/Time   CALCIUM 9.2 09/09/2013 0934   ALKPHOS 55 09/09/2013 0934   AST 26 09/09/2013 0934   ALT 23 09/09/2013 0934   BILITOT 0.7 09/09/2013 0934     glucose 102     Lab Results  Component Value Date   WBC 4.2* 09/09/2013   HGB 14.2 09/09/2013  HCT 42.3 09/09/2013   MCV 89.8 09/09/2013   PLT 224.0 09/09/2013    Lab Results  Component Value Date   TSH 2.42 09/09/2013    Patient Active Problem List   Diagnosis Date Noted  . Encounter for Medicare annual wellness exam 09/13/2013  . Other screening mammogram 04/19/2011  . IRRITABLE BOWEL SYNDROME 01/22/2010  . HYPERCHOLESTEROLEMIA 10/03/2007  . GLAUCOMA 10/03/2007  . HYPERTENSION 10/03/2007  . ALLERGIC RHINITIS 10/03/2007  . OSTEOARTHRITIS 10/03/2007  . OSTEOPOROSIS 10/03/2007   Past Medical History  Diagnosis Date  . Allergy   . Hypertension   . Arthritis     Osteoarthritis  . Osteoporosis    Past Surgical History  Procedure Laterality Date  . Tonsillectomy    . Eye surgery      Bilateral cataract Surgery   History  Substance Use Topics  . Smoking status: Never Smoker   . Smokeless tobacco: Not on file  . Alcohol Use: No   Family History   Problem Relation Age of Onset  . Dementia Mother   . Cancer Father     Kidney  . Heart disease Father   . Hypertension Brother   . Heart disease Maternal Uncle   . Stroke Maternal Grandmother   . Heart disease Maternal Grandfather   . Cancer Paternal Grandmother    No Known Allergies Current Outpatient Prescriptions on File Prior to Visit  Medication Sig Dispense Refill  . acetaminophen (TYLENOL ARTHRITIS PAIN) 650 MG CR tablet Take 650 mg by mouth every 8 (eight) hours as needed. Alternates with Ibuprofen.       Marland Kitchen aspirin 81 MG tablet Take 81 mg by mouth daily.        . bisoprolol-hydrochlorothiazide (ZIAC) 2.5-6.25 MG per tablet Take 1 tablet by mouth daily.  90 tablet  3  . bisoprolol-hydrochlorothiazide (ZIAC) 2.5-6.25 MG per tablet TAKE ONE TABLET BY MOUTH EVERY DAY  90 tablet  1  . calcium carbonate (OS-CAL) 600 MG TABS Take 600 mg by mouth 2 (two) times daily with a meal.        . chlorpheniramine (CHLOR-TRIMETON) 4 MG tablet Take 4 mg by mouth 2 (two) times daily as needed.        Marland Kitchen ibuprofen (ADVIL,MOTRIN) 200 MG tablet Take 200 mg by mouth every 6 (six) hours as needed. Alternates with Tylenol       . loratadine (CLARITIN) 10 MG tablet Take 10 mg by mouth as directed.        . phenylephrine (NEO-SYNEPHRINE) 0.125 % nasal drops Place 1 drop into the nose as needed.      Vladimir Faster Glycol-Propyl Glycol (SYSTANE OP) Apply to eye 2 (two) times daily as needed.      . timolol (BETIMOL) 0.5 % ophthalmic solution Place 1 drop into both eyes daily.       . verapamil (CALAN-SR) 240 MG CR tablet Take 1 tablet (240 mg total) by mouth at bedtime.  90 tablet  3   No current facility-administered medications on file prior to visit.     Review of Systems Review of Systems  Constitutional: Negative for fever, appetite change, fatigue and unexpected weight change.  Eyes: Negative for pain and visual disturbance.  Respiratory: Negative for cough and shortness of breath.    Cardiovascular: Negative for cp or palpitations    Gastrointestinal: Negative for nausea, diarrhea and constipation.  Genitourinary: Negative for urgency and frequency.  Skin: Negative for pallor or rash   Neurological: Negative for weakness, light-headedness,  numbness and headaches.  Hematological: Negative for adenopathy. Does not bruise/bleed easily.  Psychiatric/Behavioral: Negative for dysphoric mood. The patient is not nervous/anxious.         Objective:   Physical Exam  Constitutional: She appears well-developed and well-nourished. No distress.  obese and well appearing   HENT:  Head: Normocephalic and atraumatic.  Right Ear: External ear normal.  Left Ear: External ear normal.  Mouth/Throat: Oropharynx is clear and moist.  Eyes: Conjunctivae and EOM are normal. Pupils are equal, round, and reactive to light. No scleral icterus.  Neck: Normal range of motion. Neck supple. No JVD present. Carotid bruit is not present. No thyromegaly present.  Cardiovascular: Normal rate, regular rhythm, normal heart sounds and intact distal pulses.  Exam reveals no gallop.   Pulmonary/Chest: Effort normal and breath sounds normal. No respiratory distress. She has no wheezes. She exhibits no tenderness.  Abdominal: Soft. Bowel sounds are normal. She exhibits no distension, no abdominal bruit and no mass. There is no tenderness.  Genitourinary: No breast swelling, tenderness, discharge or bleeding.  Breast exam: No mass, nodules, thickening, tenderness, bulging, retraction, inflamation, nipple discharge or skin changes noted.  No axillary or clavicular LA.  Chaperoned exam.    Musculoskeletal: Normal range of motion. She exhibits no edema and no tenderness.  Lymphadenopathy:    She has no cervical adenopathy.  Neurological: She is alert. She has normal reflexes. No cranial nerve deficit. She exhibits normal muscle tone. Coordination normal.  Skin: Skin is warm and dry. No rash noted. No erythema.  No pallor.  sks diffusely  Psychiatric: She has a normal mood and affect.          Assessment & Plan:

## 2013-09-13 NOTE — Patient Instructions (Signed)
Stop up front for referral for bone density test  Continue your calcium and vitamin D Get back to the gym  Watch diet for cholesterol  Follow up to TB test reading on Monday   Fat and Cholesterol Control Diet Fat and cholesterol levels in your blood and organs are influenced by your diet. High levels of fat and cholesterol may lead to diseases of the heart, small and large blood vessels, gallbladder, liver, and pancreas. CONTROLLING FAT AND CHOLESTEROL WITH DIET Although exercise and lifestyle factors are important, your diet is key. That is because certain foods are known to raise cholesterol and others to lower it. The goal is to balance foods for their effect on cholesterol and more importantly, to replace saturated and trans fat with other types of fat, such as monounsaturated fat, polyunsaturated fat, and omega-3 fatty acids. On average, a person should consume no more than 15 to 17 g of saturated fat daily. Saturated and trans fats are considered "bad" fats, and they will raise LDL cholesterol. Saturated fats are primarily found in animal products such as meats, butter, and cream. However, that does not mean you need to give up all your favorite foods. Today, there are good tasting, low-fat, low-cholesterol substitutes for most of the things you like to eat. Choose low-fat or nonfat alternatives. Choose round or loin cuts of red meat. These types of cuts are lowest in fat and cholesterol. Chicken (without the skin), fish, veal, and ground [place] breast are great choices. Eliminate fatty meats, such as hot dogs and salami. Even shellfish have little or no saturated fat. Have a 3 oz (85 g) portion when you eat lean meat, poultry, or fish. Trans fats are also called "partially hydrogenated oils." They are oils that have been scientifically manipulated so that they are solid at room temperature resulting in a longer shelf life and improved taste and texture of foods in which they are added. Trans fats  are found in stick margarine, some tub margarines, cookies, crackers, and baked goods.  When baking and cooking, oils are a great substitute for butter. The monounsaturated oils are especially beneficial since it is believed they lower LDL and raise HDL. The oils you should avoid entirely are saturated tropical oils, such as coconut and palm.  Remember to eat a lot from food groups that are naturally free of saturated and trans fat, including fish, fruit, vegetables, beans, grains (barley, rice, couscous, bulgur wheat), and pasta (without cream sauces).  IDENTIFYING FOODS THAT LOWER FAT AND CHOLESTEROL  Soluble fiber may lower your cholesterol. This type of fiber is found in fruits such as apples, vegetables such as broccoli, potatoes, and carrots, legumes such as beans, peas, and lentils, and grains such as barley. Foods fortified with plant sterols (phytosterol) may also lower cholesterol. You should eat at least 2 g per day of these foods for a cholesterol lowering effect.  Read package labels to identify low-saturated fats, trans fat free, and low-fat foods at the supermarket. Select cheeses that have only 2 to 3 g saturated fat per ounce. Use a heart-healthy tub margarine that is free of trans fats or partially hydrogenated oil. When buying baked goods (cookies, crackers), avoid partially hydrogenated oils. Breads and muffins should be made from whole grains (whole-wheat or whole oat flour, instead of "flour" or "enriched flour"). Buy non-creamy canned soups with reduced salt and no added fats.  FOOD PREPARATION TECHNIQUES  Never deep-fry. If you must fry, either stir-fry, which uses very little fat,  or use non-stick cooking sprays. When possible, broil, bake, or roast meats, and steam vegetables. Instead of putting butter or margarine on vegetables, use lemon and herbs, applesauce, and cinnamon (for squash and sweet potatoes). Use nonfat yogurt, salsa, and low-fat dressings for salads.  LOW-SATURATED  FAT / LOW-FAT FOOD SUBSTITUTES Meats / Saturated Fat (g)  Avoid: Steak, marbled (3 oz/85 g) / 11 g  Choose: Steak, lean (3 oz/85 g) / 4 g  Avoid: Hamburger (3 oz/85 g) / 7 g  Choose: Hamburger, lean (3 oz/85 g) / 5 g  Avoid: Ham (3 oz/85 g) / 6 g  Choose: Ham, lean cut (3 oz/85 g) / 2.4 g  Avoid: Chicken, with skin, dark meat (3 oz/85 g) / 4 g  Choose: Chicken, skin removed, dark meat (3 oz/85 g) / 2 g  Avoid: Chicken, with skin, light meat (3 oz/85 g) / 2.5 g  Choose: Chicken, skin removed, light meat (3 oz/85 g) / 1 g Dairy / Saturated Fat (g)  Avoid: Whole milk (1 cup) / 5 g  Choose: Low-fat milk, 2% (1 cup) / 3 g  Choose: Low-fat milk, 1% (1 cup) / 1.5 g  Choose: Skim milk (1 cup) / 0.3 g  Avoid: Hard cheese (1 oz/28 g) / 6 g  Choose: Skim milk cheese (1 oz/28 g) / 2 to 3 g  Avoid: Cottage cheese, 4% fat (1 cup) / 6.5 g  Choose: Low-fat cottage cheese, 1% fat (1 cup) / 1.5 g  Avoid: Ice cream (1 cup) / 9 g  Choose: Sherbet (1 cup) / 2.5 g  Choose: Nonfat frozen yogurt (1 cup) / 0.3 g  Choose: Frozen fruit bar / trace  Avoid: Whipped cream (1 tbs) / 3.5 g  Choose: Nondairy whipped topping (1 tbs) / 1 g Condiments / Saturated Fat (g)  Avoid: Mayonnaise (1 tbs) / 2 g  Choose: Low-fat mayonnaise (1 tbs) / 1 g  Avoid: Butter (1 tbs) / 7 g  Choose: Extra light margarine (1 tbs) / 1 g  Avoid: Coconut oil (1 tbs) / 11.8 g  Choose: Olive oil (1 tbs) / 1.8 g  Choose: Corn oil (1 tbs) / 1.7 g  Choose: Safflower oil (1 tbs) / 1.2 g  Choose: Sunflower oil (1 tbs) / 1.4 g  Choose: Soybean oil (1 tbs) / 2.4 g  Choose: Canola oil (1 tbs) / 1 g Document Released: 08/22/2005 Document Revised: 12/17/2012 Document Reviewed: 02/10/2011 [employer] Patient Information 2014 [place], [place].

## 2013-09-14 NOTE — Assessment & Plan Note (Signed)
Reviewed health habits including diet and exercise and skin cancer prevention Reviewed appropriate screening tests for age  Also reviewed health mt list, fam hx and immunization status , as well as social and family history   See HPI Wellness lab reviewed

## 2013-09-14 NOTE — Assessment & Plan Note (Signed)
Disc goals for lipids and reasons to control them Rev labs with pt Rev low sat fat diet in detail  This is high- and pt refuses any med for chol now or in future Handout given on low sat fat diet

## 2013-09-14 NOTE — Assessment & Plan Note (Signed)
BP: 122/74 mmHg  bp in fair control at this time  No changes needed Disc lifstyle change with low sodium diet and exercise   Lab reviewed

## 2013-09-14 NOTE — Assessment & Plan Note (Signed)
Schedule 2 y dexa  Had evista for 5 y  D level tx  Disc need for calcium/ vitamin D/ wt bearing exercise and bone density test every 2 y to monitor Disc safety/ fracture risk in detail

## 2013-09-19 ENCOUNTER — Telehealth: Payer: Self-pay

## 2013-09-19 NOTE — Telephone Encounter (Signed)
Pt forgot to return to have PPD read that was done on 09/13/13. Pt rescheduled PPD on 10/08/13.

## 2013-09-22 ENCOUNTER — Encounter: Payer: Self-pay | Admitting: Family Medicine

## 2013-09-23 ENCOUNTER — Telehealth: Payer: Self-pay | Admitting: Family Medicine

## 2013-09-23 DIAGNOSIS — Z1283 Encounter for screening for malignant neoplasm of skin: Secondary | ICD-10-CM | POA: Insufficient documentation

## 2013-09-23 NOTE — Telephone Encounter (Signed)
Pt notified referral done

## 2013-09-23 NOTE — Telephone Encounter (Signed)
I did referral for pt's dermatology visit- it is already scheduled 1/22 with Dr Nehemiah Massed- please call her and let her know I put it in

## 2013-10-08 ENCOUNTER — Ambulatory Visit: Payer: Medicare HMO

## 2013-10-23 ENCOUNTER — Ambulatory Visit: Payer: Medicare HMO

## 2013-10-30 ENCOUNTER — Ambulatory Visit: Payer: Self-pay | Admitting: Family Medicine

## 2013-11-04 ENCOUNTER — Telehealth: Payer: Self-pay

## 2013-11-04 ENCOUNTER — Encounter: Payer: Self-pay | Admitting: Family Medicine

## 2013-11-04 NOTE — Telephone Encounter (Signed)
Pt wants to know if can come Thur afternoon to have TB skin test that is done on 03/03/1. Advised pt can come back to have read on Thur afternoon to have skin test read; pt voiced understanding.

## 2013-11-05 ENCOUNTER — Ambulatory Visit (INDEPENDENT_AMBULATORY_CARE_PROVIDER_SITE_OTHER): Payer: Medicare HMO

## 2013-11-05 DIAGNOSIS — Z111 Encounter for screening for respiratory tuberculosis: Secondary | ICD-10-CM

## 2013-11-07 ENCOUNTER — Telehealth: Payer: Self-pay

## 2013-11-07 LAB — TB SKIN TEST: TB SKIN TEST: NEGATIVE

## 2013-11-07 NOTE — Telephone Encounter (Signed)
PPD neg and letter given to pt with results.

## 2014-06-16 ENCOUNTER — Ambulatory Visit (INDEPENDENT_AMBULATORY_CARE_PROVIDER_SITE_OTHER): Payer: Commercial Managed Care - HMO

## 2014-06-16 ENCOUNTER — Telehealth: Payer: Self-pay

## 2014-06-16 DIAGNOSIS — Z23 Encounter for immunization: Secondary | ICD-10-CM

## 2014-06-16 NOTE — Telephone Encounter (Signed)
She should get one - I do advise she call insurance to see if they cover it - I do not think medicare does If not covered- would be cheaper to get it at the health dept

## 2014-06-16 NOTE — Telephone Encounter (Signed)
Pt is going to help take care of grandchild that is expected in Dec 2015 and should pt get tdap. Pt request cb. Pt has already scheduled nurse visit for 07/18/14.

## 2014-06-17 NOTE — Telephone Encounter (Signed)
Spoke to Monica Guerrero. Advised her of Dr. Alba Cory comments. Monica Guerrero is going to check with ins co and let us know.

## 2014-06-17 NOTE — Telephone Encounter (Signed)
Left voicemail requesting pt to call office 

## 2014-07-18 ENCOUNTER — Ambulatory Visit: Payer: Medicare HMO

## 2014-09-10 ENCOUNTER — Other Ambulatory Visit: Payer: Self-pay | Admitting: Family Medicine

## 2014-09-12 ENCOUNTER — Other Ambulatory Visit: Payer: Self-pay | Admitting: Family Medicine

## 2014-09-20 ENCOUNTER — Other Ambulatory Visit: Payer: Self-pay | Admitting: Family Medicine

## 2014-10-16 DIAGNOSIS — Z1283 Encounter for screening for malignant neoplasm of skin: Secondary | ICD-10-CM | POA: Diagnosis not present

## 2014-10-16 DIAGNOSIS — Z85828 Personal history of other malignant neoplasm of skin: Secondary | ICD-10-CM | POA: Diagnosis not present

## 2014-10-16 DIAGNOSIS — L821 Other seborrheic keratosis: Secondary | ICD-10-CM | POA: Diagnosis not present

## 2014-10-16 DIAGNOSIS — L578 Other skin changes due to chronic exposure to nonionizing radiation: Secondary | ICD-10-CM | POA: Diagnosis not present

## 2014-10-16 DIAGNOSIS — D229 Melanocytic nevi, unspecified: Secondary | ICD-10-CM | POA: Diagnosis not present

## 2014-10-16 DIAGNOSIS — D1800 Hemangioma unspecified site: Secondary | ICD-10-CM | POA: Diagnosis not present

## 2014-10-16 DIAGNOSIS — L814 Other melanin hyperpigmentation: Secondary | ICD-10-CM | POA: Diagnosis not present

## 2014-10-17 ENCOUNTER — Ambulatory Visit: Payer: Medicare HMO | Admitting: Family Medicine

## 2014-10-20 ENCOUNTER — Ambulatory Visit: Payer: Medicare HMO | Admitting: Family Medicine

## 2014-10-22 ENCOUNTER — Ambulatory Visit (INDEPENDENT_AMBULATORY_CARE_PROVIDER_SITE_OTHER)
Admission: RE | Admit: 2014-10-22 | Discharge: 2014-10-22 | Disposition: A | Discharge status: Home / Self Care | Payer: Commercial Managed Care - HMO | Source: Ambulatory Visit | Attending: Family Medicine | Admitting: Family Medicine

## 2014-10-22 ENCOUNTER — Encounter: Payer: Self-pay | Admitting: Family Medicine

## 2014-10-22 ENCOUNTER — Ambulatory Visit (INDEPENDENT_AMBULATORY_CARE_PROVIDER_SITE_OTHER): Payer: Commercial Managed Care - HMO | Admitting: Family Medicine

## 2014-10-22 VITALS — BP 126/74 | HR 68 | Temp 97.9°F | Ht 62.75 in | Wt 185.0 lb

## 2014-10-22 DIAGNOSIS — M25511 Pain in right shoulder: Secondary | ICD-10-CM | POA: Insufficient documentation

## 2014-10-22 DIAGNOSIS — S4991XA Unspecified injury of right shoulder and upper arm, initial encounter: Secondary | ICD-10-CM | POA: Diagnosis not present

## 2014-10-22 NOTE — Progress Notes (Signed)
Pre visit review using our clinic review tool, if applicable. No additional management support is needed unless otherwise documented below in the visit note. 

## 2014-10-22 NOTE — Patient Instructions (Signed)
Xray now  Continue anti inflammatory - either ibuprofen or aleve   (ibuprofen up to 800 mg every 8 hours or aleve 2 pills every 12 hours)- no not overlap them  Continue ice if helpful Try the finger crawl/wall exercise to keep shoulder from getting too stiff   We will contact you with result and plan

## 2014-10-22 NOTE — Assessment & Plan Note (Signed)
After fall 12/31 on outstretched hand  Suspect a degree of rotator cuff injury (tear cannot be ruled out)  Limited rom  Demonstrated passive rom exercise  Will continue otc nsaid (declined px one) Ice  Xray now -then plan

## 2014-10-22 NOTE — Progress Notes (Signed)
Subjective:    Patient ID: Monica Guerrero, female    DOB: 07-Apr-1941, 74 y.o.   MRN: [id number]  HPI Here with R shoulder pain   [place] on Dec 31 Slipped on wet leaves  Put right hand out to catch herself  Does not think she broke anything  Thinks it is muscular  Improved but not totally better  Stiff after inactivity- takes a bit of effort to get it moving  Also resistance of more than 1-2 lb  Reaching in back of her and abducting is painful   No prev injury She has taken ibup or aleve and tylenol  Sleeps ok  No pain to lie on it  Has used both ice and heat   Patient Active Problem List   Diagnosis Date Noted  . Skin cancer screening 09/23/2013  . Encounter for Medicare annual wellness exam 09/13/2013  . Other screening mammogram 04/19/2011  . IRRITABLE BOWEL SYNDROME 01/22/2010  . HYPERCHOLESTEROLEMIA 10/03/2007  . GLAUCOMA 10/03/2007  . HYPERTENSION 10/03/2007  . ALLERGIC RHINITIS 10/03/2007  . OSTEOARTHRITIS 10/03/2007  . OSTEOPOROSIS 10/03/2007   Past Medical History  Diagnosis Date  . Allergy   . Hypertension   . Arthritis     Osteoarthritis  . Osteoporosis    Past Surgical History  Procedure Laterality Date  . Tonsillectomy    . Eye surgery      Bilateral cataract Surgery   History  Substance Use Topics  . Smoking status: Never Smoker   . Smokeless tobacco: Not on file  . Alcohol Use: No   Family History  Problem Relation Age of Onset  . Dementia Mother   . Cancer Father     Kidney  . Heart disease Father   . Hypertension Brother   . Heart disease Maternal Uncle   . Stroke Maternal Grandmother   . Heart disease Maternal Grandfather   . Cancer Paternal Grandmother    No Known Allergies Current Outpatient Prescriptions on File Prior to Visit  Medication Sig Dispense Refill  . acetaminophen (TYLENOL ARTHRITIS PAIN) 650 MG CR tablet Take 650 mg by mouth every 8 (eight) hours as needed. Alternates with Ibuprofen.     Marland Kitchen aspirin 81 MG tablet  Take 81 mg by mouth daily.      . bisoprolol-hydrochlorothiazide (ZIAC) 2.5-6.25 MG per tablet TAKE ONE TABLET BY MOUTH EVERY DAY 90 tablet 0  . calcium carbonate (OS-CAL) 600 MG TABS Take 600 mg by mouth 2 (two) times daily with a meal.      . chlorpheniramine (CHLOR-TRIMETON) 4 MG tablet Take 4 mg by mouth 2 (two) times daily as needed.      Marland Kitchen ibuprofen (ADVIL,MOTRIN) 200 MG tablet Take 200 mg by mouth every 6 (six) hours as needed. Alternates with Tylenol     . loratadine (CLARITIN) 10 MG tablet Take 10 mg by mouth as directed.      . phenylephrine (NEO-SYNEPHRINE) 0.125 % nasal drops Place 1 drop into the nose as needed.    Vladimir Faster Glycol-Propyl Glycol (SYSTANE OP) Apply to eye 2 (two) times daily as needed.    . timolol (BETIMOL) 0.5 % ophthalmic solution Place 1 drop into both eyes daily.     . verapamil (CALAN-SR) 240 MG CR tablet TAKE ONE TABLET BY MOUTH AT BEDTIME 90 tablet 1   No current facility-administered medications on file prior to visit.      Review of Systems Review of Systems  Constitutional: Negative for fever, appetite change, fatigue  and unexpected weight change.  Eyes: Negative for pain and visual disturbance.  Respiratory: Negative for cough and shortness of breath.   Cardiovascular: Negative for cp or palpitations    Gastrointestinal: Negative for nausea, diarrhea and constipation.  Genitourinary: Negative for urgency and frequency.  Skin: Negative for pallor or rash   MSK pos for R shoulder pain and stiffness  Neurological: Negative for weakness, light-headedness, numbness and headaches.  Hematological: Negative for adenopathy. Does not bruise/bleed easily.  Psychiatric/Behavioral: Negative for dysphoric mood. The patient is not nervous/anxious.         Objective:   Physical Exam  Constitutional: She appears well-developed and well-nourished. No distress.  obese and well appearing   HENT:  Head: Normocephalic and atraumatic.  Eyes: Conjunctivae and  EOM are normal. Pupils are equal, round, and reactive to light.  Neck: Normal range of motion. Neck supple.  Cardiovascular: Normal rate and regular rhythm.   Pulmonary/Chest: Effort normal and breath sounds normal.  Musculoskeletal: She exhibits tenderness. She exhibits no edema.       Right shoulder: She exhibits decreased range of motion and tenderness. She exhibits no bony tenderness, no swelling, no effusion, no crepitus, normal pulse and normal strength.  R shoulder  Pain to abduct shoulder past 90 deg  Apprehension to extend it  Mild acromion tenderness without swelling  Pos hawking maneuver  Pos neer test     Lymphadenopathy:    She has no cervical adenopathy.  Neurological: She is alert. She has normal reflexes. No cranial nerve deficit. She exhibits normal muscle tone. Coordination normal.  Skin: Skin is warm and dry. No rash noted. No erythema. No pallor.  Psychiatric: She has a normal mood and affect.          Assessment & Plan:   Problem List Items Addressed This Visit      Other   Shoulder pain, right - Primary    After fall 12/31 on outstretched hand  Suspect a degree of rotator cuff injury (tear cannot be ruled out)  Limited rom  Demonstrated passive rom exercise  Will continue otc nsaid (declined px one) Ice  Xray now -then plan      Relevant Orders   DG Shoulder Right (Completed)

## 2014-10-23 ENCOUNTER — Telehealth: Payer: Self-pay | Admitting: Family Medicine

## 2014-10-23 DIAGNOSIS — E7800 Pure hypercholesterolemia, unspecified: Secondary | ICD-10-CM

## 2014-10-23 DIAGNOSIS — I10 Essential (primary) hypertension: Secondary | ICD-10-CM

## 2014-10-23 DIAGNOSIS — M810 Age-related osteoporosis without current pathological fracture: Secondary | ICD-10-CM

## 2014-10-23 NOTE — Telephone Encounter (Signed)
-----   Message from Ellamae Sia sent at 10/17/2014  9:57 AM EST ----- Regarding: Lab orders for Friday, 2.19.16 Patient is scheduled for CPX labs, please order future labs, Thanks , Karna Christmas

## 2014-10-24 ENCOUNTER — Other Ambulatory Visit (INDEPENDENT_AMBULATORY_CARE_PROVIDER_SITE_OTHER): Payer: Commercial Managed Care - HMO

## 2014-10-24 DIAGNOSIS — E7800 Pure hypercholesterolemia, unspecified: Secondary | ICD-10-CM

## 2014-10-24 DIAGNOSIS — M810 Age-related osteoporosis without current pathological fracture: Secondary | ICD-10-CM | POA: Diagnosis not present

## 2014-10-24 DIAGNOSIS — I10 Essential (primary) hypertension: Secondary | ICD-10-CM

## 2014-10-24 LAB — TSH: TSH: 3.82 u[IU]/mL (ref 0.35–4.50)

## 2014-10-24 LAB — CBC WITH DIFFERENTIAL/PLATELET
BASOS ABS: 0 10*3/uL (ref 0.0–0.1)
Basophils Relative: 0.6 % (ref 0.0–3.0)
EOS ABS: 0.2 10*3/uL (ref 0.0–0.7)
Eosinophils Relative: 5.1 % — ABNORMAL HIGH (ref 0.0–5.0)
HEMATOCRIT: 42.7 % (ref 36.0–46.0)
HEMOGLOBIN: 14.6 g/dL (ref 12.0–15.0)
LYMPHS ABS: 1.3 10*3/uL (ref 0.7–4.0)
Lymphocytes Relative: 30.1 % (ref 12.0–46.0)
MCHC: 34.2 g/dL (ref 30.0–36.0)
MCV: 88.2 fl (ref 78.0–100.0)
Monocytes Absolute: 0.4 10*3/uL (ref 0.1–1.0)
Monocytes Relative: 9 % (ref 3.0–12.0)
NEUTROS ABS: 2.4 10*3/uL (ref 1.4–7.7)
Neutrophils Relative %: 55.2 % (ref 43.0–77.0)
Platelets: 242 10*3/uL (ref 150.0–400.0)
RBC: 4.84 Mil/uL (ref 3.87–5.11)
RDW: 13.6 % (ref 11.5–15.5)
WBC: 4.4 10*3/uL (ref 4.0–10.5)

## 2014-10-24 LAB — COMPREHENSIVE METABOLIC PANEL
ALT: 16 U/L (ref 0–35)
AST: 20 U/L (ref 0–37)
Albumin: 4.2 g/dL (ref 3.5–5.2)
Alkaline Phosphatase: 66 U/L (ref 39–117)
BUN: 25 mg/dL — ABNORMAL HIGH (ref 6–23)
CO2: 32 mEq/L (ref 19–32)
Calcium: 9.6 mg/dL (ref 8.4–10.5)
Chloride: 104 mEq/L (ref 96–112)
Creatinine, Ser: 0.87 mg/dL (ref 0.40–1.20)
GFR: 67.84 mL/min (ref >60.00)
Glucose, Bld: 90 mg/dL (ref 70–99)
Potassium: 4.1 mEq/L (ref 3.5–5.1)
Sodium: 142 mEq/L (ref 135–145)
Total Bilirubin: 0.5 mg/dL (ref 0.2–1.2)
Total Protein: 6.8 g/dL (ref 6.0–8.3)

## 2014-10-24 LAB — LIPID PANEL
Cholesterol: 248 mg/dL — ABNORMAL HIGH (ref 0–200)
HDL: 63.6 mg/dL (ref >39.00)
LDL Cholesterol: 165 mg/dL — ABNORMAL HIGH (ref 0–99)
NonHDL: 184.4
Total CHOL/HDL Ratio: 4
Triglycerides: 99 mg/dL (ref 0.0–149.0)
VLDL: 19.8 mg/dL (ref 0.0–40.0)

## 2014-10-24 LAB — VITAMIN D 25 HYDROXY (VIT D DEFICIENCY, FRACTURES): VITD: 18.35 ng/mL — AB (ref 30.00–100.00)

## 2014-10-31 ENCOUNTER — Encounter: Payer: Self-pay | Admitting: Family Medicine

## 2014-10-31 ENCOUNTER — Ambulatory Visit (INDEPENDENT_AMBULATORY_CARE_PROVIDER_SITE_OTHER): Payer: Commercial Managed Care - HMO | Admitting: Family Medicine

## 2014-10-31 VITALS — BP 134/76 | HR 60 | Temp 97.6°F | Ht 63.0 in | Wt 184.1 lb

## 2014-10-31 DIAGNOSIS — E2839 Other primary ovarian failure: Secondary | ICD-10-CM

## 2014-10-31 DIAGNOSIS — M810 Age-related osteoporosis without current pathological fracture: Secondary | ICD-10-CM

## 2014-10-31 DIAGNOSIS — Z0000 Encounter for general adult medical examination without abnormal findings: Secondary | ICD-10-CM | POA: Diagnosis not present

## 2014-10-31 DIAGNOSIS — I10 Essential (primary) hypertension: Secondary | ICD-10-CM

## 2014-10-31 DIAGNOSIS — Z23 Encounter for immunization: Secondary | ICD-10-CM

## 2014-10-31 DIAGNOSIS — Z1231 Encounter for screening mammogram for malignant neoplasm of breast: Secondary | ICD-10-CM | POA: Insufficient documentation

## 2014-10-31 DIAGNOSIS — E559 Vitamin D deficiency, unspecified: Secondary | ICD-10-CM | POA: Insufficient documentation

## 2014-10-31 DIAGNOSIS — E7800 Pure hypercholesterolemia, unspecified: Secondary | ICD-10-CM

## 2014-10-31 MED ORDER — VERAPAMIL HCL ER 240 MG PO TBCR: 240.0000 mg | EXTENDED_RELEASE_TABLET | Freq: Every day | ORAL | Status: DC

## 2014-10-31 MED ORDER — BISOPROLOL-HYDROCHLOROTHIAZIDE 2.5-6.25 MG PO TABS: 1.0000 | ORAL_TABLET | Freq: Every day | ORAL | Status: DC

## 2014-10-31 MED ORDER — ERGOCALCIFEROL 1.25 MG (50000 UT) PO CAPS: 50000.0000 [IU] | ORAL_CAPSULE | ORAL | Status: DC

## 2014-10-31 NOTE — Patient Instructions (Signed)
Stop at check out for referral for mammogram and dexa  prevnar vaccine today  Take the weekly vitamin D for 12 weeks  Also take 2000 iu of vitamin D daily - indefinitely  Continue your calcium plus D as well

## 2014-10-31 NOTE — Progress Notes (Signed)
Subjective:    Patient ID: Monica Guerrero, female    DOB: 22-Aug-1941, 74 y.o.   MRN: [id number]  HPI Here for annual medicare wellness visit as well as chronic/acute medical problems   Wt is stable with bmi of 32  I have personally reviewed the Medicare Annual Wellness questionnaire and have noted 1. The patient's medical and social history 2. Their use of alcohol, tobacco or illicit drugs 3. Their current medications and supplements 4. The patient's functional ability including ADL's, fall risks, home safety risks and hearing or visual             impairment. 5. Diet and physical activities 6. Evidence for depression or mood disorders  The patients weight, height, BMI have been recorded in the chart and visual acuity is per eye clinic.  I have made referrals, counseling and provided education to the patient based review of the above and I have provided the pt with a written personalized care plan for preventive services.  Has been feeling ok   See scanned forms.  Routine anticipatory guidance given to patient.  See health maintenance. Colon cancer screening 7/11  Breast cancer screening mammogram 2/15 - needs to get that [facility])  Self breast exam -no lumps  Gyn - no problems  Flu vaccine 10/15 Tetanus vaccine 12/13 (was a Tdap)  Pneumovax 8/12 - wants prevnar vaccine today  Zoster vaccine 6/12   Advance directive has that written up  Cognitive function addressed- see scanned forms- and if abnormal then additional documentation follows.  No major concerns    PMH and SH reviewed  Meds, vitals, and allergies reviewed.   ROS: See HPI.  Otherwise negative.     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  D level is low at 18.35 Takes her calcium plus D once per day  Has had 5 y of evista  She went for her dexa - and was told since she took her ca that day she was turned away    Hyperlipidemia Denies tx Lab Results  Component Value Date   CHOL 248* 10/24/2014   CHOL 253*  09/09/2013   CHOL 261* 08/01/2012   Lab Results  Component Value Date   HDL 63.60 10/24/2014   HDL 63.00 09/09/2013   HDL 66.40 08/01/2012   Lab Results  Component Value Date   LDLCALC 165* 10/24/2014   Lab Results  Component Value Date   TRIG 99.0 10/24/2014   TRIG 91.0 09/09/2013   TRIG 81.0 08/01/2012   Lab Results  Component Value Date   CHOLHDL 4 10/24/2014   CHOLHDL 4 09/09/2013   CHOLHDL 4 08/01/2012   Lab Results  Component Value Date   LDLDIRECT 175.7 09/09/2013   LDLDIRECT 184.5 08/01/2012   LDLDIRECT 169.6 10/20/2011    Overall pretty stable - good HDL  She declines medication of any kind  She has red meat at most twice per month  No fried foods  She loves cheese  Eats [place] bacon   Lab Results  Component Value Date   WBC 4.4 10/24/2014   HGB 14.6 10/24/2014   HCT 42.7 10/24/2014   MCV 88.2 10/24/2014   PLT 242.0 10/24/2014      Chemistry      Component Value Date/Time   NA 142 10/24/2014 0841   K 4.1 10/24/2014 0841   CL 104 10/24/2014 0841   CO2 32 10/24/2014 0841   BUN 25* 10/24/2014 0841   CREATININE 0.87 10/24/2014 0841  Component Value Date/Time   CALCIUM 9.6 10/24/2014 0841   ALKPHOS 66 10/24/2014 0841   AST 20 10/24/2014 0841   ALT 16 10/24/2014 0841   BILITOT 0.5 10/24/2014 0841     glucose is 90   Lab Results  Component Value Date   TSH 3.82 10/24/2014     Patient Active Problem List   Diagnosis Date Noted  . Estrogen deficiency 10/31/2014  . Encounter for screening mammogram for breast cancer 10/31/2014  . Vitamin D deficiency 10/31/2014  . Shoulder pain, right 10/22/2014  . Skin cancer screening 09/23/2013  . Encounter for Medicare annual wellness exam 09/13/2013  . Other screening mammogram 04/19/2011  . IRRITABLE BOWEL SYNDROME 01/22/2010  . HYPERCHOLESTEROLEMIA 10/03/2007  . GLAUCOMA 10/03/2007  . Essential hypertension 10/03/2007  . ALLERGIC RHINITIS 10/03/2007  . OSTEOARTHRITIS 10/03/2007  .  Osteoporosis 10/03/2007   Past Medical History  Diagnosis Date  . Allergy   . Hypertension   . Arthritis     Osteoarthritis  . Osteoporosis    Past Surgical History  Procedure Laterality Date  . Tonsillectomy    . Eye surgery      Bilateral cataract Surgery   History  Substance Use Topics  . Smoking status: Never Smoker   . Smokeless tobacco: Not on file  . Alcohol Use: No   Family History  Problem Relation Age of Onset  . Dementia Mother   . Cancer Father     Kidney  . Heart disease Father   . Hypertension Brother   . Heart disease Maternal Uncle   . Stroke Maternal Grandmother   . Heart disease Maternal Grandfather   . Cancer Paternal Grandmother    No Known Allergies Current Outpatient Prescriptions on File Prior to Visit  Medication Sig Dispense Refill  . acetaminophen (TYLENOL ARTHRITIS PAIN) 650 MG CR tablet Take 650 mg by mouth every 8 (eight) hours as needed. Alternates with Ibuprofen.     Marland Kitchen aspirin 81 MG tablet Take 81 mg by mouth daily.      . calcium carbonate (OS-CAL) 600 MG TABS Take 600 mg by mouth 2 (two) times daily with a meal.      . chlorpheniramine (CHLOR-TRIMETON) 4 MG tablet Take 4 mg by mouth 2 (two) times daily as needed.      Marland Kitchen ibuprofen (ADVIL,MOTRIN) 200 MG tablet Take 200 mg by mouth every 6 (six) hours as needed. Alternates with Tylenol     . loratadine (CLARITIN) 10 MG tablet Take 10 mg by mouth as directed.      . phenylephrine (NEO-SYNEPHRINE) 0.125 % nasal drops Place 1 drop into the nose as needed.    Vladimir Faster Glycol-Propyl Glycol (SYSTANE OP) Apply to eye 2 (two) times daily as needed.    . timolol (BETIMOL) 0.5 % ophthalmic solution Place 1 drop into both eyes daily.      No current facility-administered medications on file prior to visit.    Review of Systems    Review of Systems  Constitutional: Negative for fever, appetite change, fatigue and unexpected weight change.  Eyes: Negative for pain and visual disturbance.    Respiratory: Negative for cough and shortness of breath.   Cardiovascular: Negative for cp or palpitations    Gastrointestinal: Negative for nausea, diarrhea and constipation.  Genitourinary: Negative for urgency and frequency.  Skin: Negative for pallor or rash   Neurological: Negative for weakness, light-headedness, numbness and headaches.  Hematological: Negative for adenopathy. Does not bruise/bleed easily.  Psychiatric/Behavioral: Negative for  dysphoric mood. The patient is not nervous/anxious.      Objective:   Physical Exam  Constitutional: She appears well-developed and well-nourished. No distress.  obese and well appearing   HENT:  Head: Normocephalic and atraumatic.  Right Ear: External ear normal.  Left Ear: External ear normal.  Mouth/Throat: Oropharynx is clear and moist.  Eyes: Conjunctivae and EOM are normal. Pupils are equal, round, and reactive to light. No scleral icterus.  Neck: Normal range of motion. Neck supple. No JVD present. Carotid bruit is not present. No thyromegaly present.  Cardiovascular: Normal rate, regular rhythm, normal heart sounds and intact distal pulses.  Exam reveals no gallop.   Pulmonary/Chest: Effort normal and breath sounds normal. No respiratory distress. She has no wheezes. She exhibits no tenderness.  Abdominal: Soft. Bowel sounds are normal. She exhibits no distension, no abdominal bruit and no mass. There is no tenderness.  Genitourinary: No breast swelling, tenderness, discharge or bleeding.  Breast exam: No mass, nodules, thickening, tenderness, bulging, retraction, inflamation, nipple discharge or skin changes noted.  No axillary or clavicular LA.      Musculoskeletal: Normal range of motion. She exhibits no edema or tenderness.  No kyphosis  Lymphadenopathy:    She has no cervical adenopathy.  Neurological: She is alert. She has normal reflexes. No cranial nerve deficit. She exhibits normal muscle tone. Coordination normal.  Skin:  Skin is warm and dry. No rash noted. No erythema. No pallor.  Psychiatric: She has a normal mood and affect.          Assessment & Plan:   Problem List Items Addressed This Visit      Cardiovascular and Mediastinum   Essential hypertension    bp in fair control at this time  BP Readings from Last 1 Encounters:  10/31/14 134/76   No changes needed Disc lifstyle change with low sodium diet and exercise  Labs reviewed       Relevant Medications   bisoprolol-hydrochlorothiazide (ZIAC) 2.5-6.25 MG per tablet   verapamil (CALAN-SR) CR tablet     Musculoskeletal and Integument   Osteoporosis    Disc need for calcium/ vitamin D/ wt bearing exercise and bone density test every 2 y to monitor Disc safety/ fracture risk in detail  Has had 5 y of evista in the past  Ref for f/u dexa       Relevant Medications   ergocalciferol (VITAMIN D2) 50000 UNITS capsule     Other   Encounter for Medicare annual wellness exam    Reviewed health habits including diet and exercise and skin cancer prevention Reviewed appropriate screening tests for age  Also reviewed health mt list, fam hx and immunization status , as well as social and family history   See HPI Ref for mammogram and dexa prevnar vaccine today  Labs rev       Encounter for screening mammogram for breast cancer    Scheduled annual screening mammogram Nl breast exam today  Encouraged monthly self exams        Relevant Orders   MM DIGITAL SCREENING BILATERAL   Estrogen deficiency - Primary   Relevant Orders   DG Bone Density   HYPERCHOLESTEROLEMIA    Disc goals for lipids and reasons to control them Rev labs with pt Rev low sat fat diet in detail Good HDL       Relevant Medications   bisoprolol-hydrochlorothiazide (ZIAC) 2.5-6.25 MG per tablet   verapamil (CALAN-SR) CR tablet   Vitamin D deficiency  Px ergocalciferol 50,000 iu weekly for 12 weeks  Also 2000 iu daily  Continue ca plus D as well   Disc  imp to bone and overall health       Other Visit Diagnoses    Need for pneumococcal vaccination        Relevant Orders    Pneumococcal conjugate vaccine 13-valent IM (Completed)

## 2014-10-31 NOTE — Progress Notes (Signed)
Pre visit review using our clinic review tool, if applicable. No additional management support is needed unless otherwise documented below in the visit note. 

## 2014-11-02 NOTE — Assessment & Plan Note (Signed)
Scheduled annual screening mammogram Nl breast exam today  Encouraged monthly self exams   

## 2014-11-02 NOTE — Assessment & Plan Note (Signed)
Disc need for calcium/ vitamin D/ wt bearing exercise and bone density test every 2 y to monitor Disc safety/ fracture risk in detail  Has had 5 y of evista in the past  Ref for f/u dexa

## 2014-11-02 NOTE — Assessment & Plan Note (Signed)
Px ergocalciferol 50,000 iu weekly for 12 weeks  Also 2000 iu daily  Continue ca plus D as well   Disc imp to bone and overall health

## 2014-11-02 NOTE — Assessment & Plan Note (Signed)
Disc goals for lipids and reasons to control them Rev labs with pt Rev low sat fat diet in detail Good HDL

## 2014-11-02 NOTE — Assessment & Plan Note (Signed)
Reviewed health habits including diet and exercise and skin cancer prevention Reviewed appropriate screening tests for age  Also reviewed health mt list, fam hx and immunization status , as well as social and family history   See HPI Ref for mammogram and dexa prevnar vaccine today  Labs rev

## 2014-11-02 NOTE — Assessment & Plan Note (Signed)
bp in fair control at this time  BP Readings from Last 1 Encounters:  10/31/14 134/76   No changes needed Disc lifstyle change with low sodium diet and exercise  Labs reviewed

## 2014-11-04 ENCOUNTER — Telehealth: Payer: Self-pay | Admitting: Family Medicine

## 2014-11-04 NOTE — Telephone Encounter (Signed)
emmi emailed °

## 2014-11-06 ENCOUNTER — Encounter: Payer: Self-pay | Admitting: Family Medicine

## 2014-11-06 ENCOUNTER — Ambulatory Visit (INDEPENDENT_AMBULATORY_CARE_PROVIDER_SITE_OTHER): Payer: Commercial Managed Care - HMO | Admitting: Family Medicine

## 2014-11-06 VITALS — BP 136/86 | HR 67 | Temp 97.8°F | Ht 63.0 in | Wt 186.5 lb

## 2014-11-06 DIAGNOSIS — M25511 Pain in right shoulder: Secondary | ICD-10-CM | POA: Diagnosis not present

## 2014-11-06 DIAGNOSIS — M75111 Incomplete rotator cuff tear or rupture of right shoulder, not specified as traumatic: Secondary | ICD-10-CM

## 2014-11-06 DIAGNOSIS — M7541 Impingement syndrome of right shoulder: Secondary | ICD-10-CM

## 2014-11-06 NOTE — Progress Notes (Signed)
Dr. Frederico Hamman T. Tashia Leiterman, MD, [facility] [place], [address] Phone: [phone] Fax: [phone]  11/06/2014  Patient: Monica Guerrero, MRN: [id number], DOB: June 27, 1941, 74 y.o.  Primary Physician:  Loura Pardon, MD  Chief Complaint: Shoulder Pain  Subjective:   Monica Guerrero is a 74 y.o. very pleasant female patient who presents with the following:  I'm asked to evaluate the patient for right-sided shoulder pain.  The patient had an episode around [place] where she lost her balance and caught herself with her right shoulder.  At that point she had sustained some acute pain, and she had some restriction in her motion initially, but this is gradually improved over the last 2 months.  She has no significant prior history of shoulder injury.  She has tried some various NSAIDs as well as Tylenol, which helps transiently with pain.  She has not done any formal physical therapy, home rehabilitation, and she does have some stiffness and pain in a T-shirt distribution at nighttime.  Her strength is relatively preserved.  New year's eve and caught herself - has gotten part of her range of motion back.  Painful and stiff in the night.   Past Medical History, Surgical History, Social History, Family History, Problem List, Medications, and Allergies have been reviewed and updated if relevant.  GEN: No fevers, chills. Nontoxic. Primarily MSK c/o today. MSK: Detailed in the HPI GI: tolerating PO intake without difficulty Neuro: No numbness, parasthesias, or tingling associated. Otherwise the pertinent positives of the ROS are noted above.   Objective:   BP 136/86 mmHg  Pulse 67  Temp(Src) 97.8 F (36.6 C) (Oral)  Ht 5\' 3"  (1.6 m)  Wt 186 lb 8 oz (84.596 kg)  BMI 33.05 kg/m2   GEN: Well-developed,well-nourished,in no acute distress; alert,appropriate and cooperative throughout examination HEENT: Normocephalic and atraumatic  without obvious abnormalities. Ears, externally no deformities PULM: Breathing comfortably in no respiratory distress EXT: No clubbing, cyanosis, or edema PSYCH: Normally interactive. Cooperative during the interview. Pleasant. Friendly and conversant. Not anxious or depressed appearing. Normal, full affect.  Shoulder: R Inspection: No muscle wasting or winging Ecchymosis/edema: neg  AC joint, scapula, clavicle: NT Cervical spine: NT, full ROM Spurling's: neg Abduction: full, 5/5, mild painful arc of motion Flexion: full, 5/5 IR, full, lift-off: 5/5 ER at neutral: full, 5/5 AC crossover: neg Neer: pos Hawkins: pos Drop Test: neg Empty Can: pos Supraspinatus insertion: mild-mod T Bicipital groove: NT Speed's: neg Yergason's: neg Sulcus sign: neg Scapular dyskinesis: none C5-T1 intact  Neuro: Sensation intact Grip 5/5   Radiology: Dg Shoulder Right  10/22/2014   CLINICAL DATA:  LEFT shoulder pain, fell on 09/04/2014, limited range of motion/abduction  EXAM: RIGHT SHOULDER - 2+ VIEW  COMPARISON:  None  FINDINGS: Mild osseous demineralization.  AC joint alignment normal.  Small sclerotic focus at proximal RIGHT humeral diaphysis question bone island.  No acute fracture, dislocation, or bone destruction.  Visualized ribs intact.  IMPRESSION: No acute osseous abnormalities.   Electronically Signed   By: Lavonia Dana M.D.   On: 10/22/2014 11:58    Assessment and Plan:   Partial tear of rotator cuff, right - Plan: Ambulatory referral to Physical Therapy  Right shoulder pain - Plan: Ambulatory referral to Physical Therapy  Impingement syndrome of right shoulder  Patient's strength is excellent.  By history and exam, most likely she had a small partial thickness rotator cuff tear, which is already  improving.  She has some residual pain in likely secondary impingement.  Recommended rotator cuff rehabilitation, scapular stabilization with assistance from formal physical therapy.  The  patient was reluctant.  Recommended at least 1-2 visits to assist.  She has a family acquaintance with Kathyrn Sheriff from [place], who may be able to assist her.  Given current pain level, subacromial injection is reasonable, but the patient does not desire such currently.  She was given a formal program from [facility].  Follow-up in 2 months if no improvement.  Sooner if needed.  New Prescriptions   No medications on file   Orders Placed This Encounter  Procedures  . Ambulatory referral to Physical Therapy    Signed,  Frederico Hamman T. Mihika Surrette, MD   Patient's Medications  New Prescriptions   No medications on file  Previous Medications   ACETAMINOPHEN (TYLENOL ARTHRITIS PAIN) 650 MG CR TABLET    Take 650 mg by mouth every 8 (eight) hours as needed. Alternates with Ibuprofen.    ASPIRIN 81 MG TABLET    Take 81 mg by mouth daily.     BISOPROLOL-HYDROCHLOROTHIAZIDE (ZIAC) 2.5-6.25 MG PER TABLET    Take 1 tablet by mouth daily.   CALCIUM CARBONATE (OS-CAL) 600 MG TABS    Take 600 mg by mouth 2 (two) times daily with a meal.     CHLORPHENIRAMINE (CHLOR-TRIMETON) 4 MG TABLET    Take 4 mg by mouth 2 (two) times daily as needed.     ERGOCALCIFEROL (VITAMIN D2) 50000 UNITS CAPSULE    Take 1 capsule (50,000 Units total) by mouth once a week.   IBUPROFEN (ADVIL,MOTRIN) 200 MG TABLET    Take 200 mg by mouth every 6 (six) hours as needed. Alternates with Tylenol    LORATADINE (CLARITIN) 10 MG TABLET    Take 10 mg by mouth as directed.     MAGNESIUM 250 MG TABS    Take 1 tablet by mouth daily.   NAPROXEN SODIUM (ANAPROX) 220 MG TABLET    Take 220 mg by mouth 2 (two) times daily.   PHENYLEPHRINE (NEO-SYNEPHRINE) 0.125 % NASAL DROPS    Place 1 drop into the nose as needed.   POLYETHYL GLYCOL-PROPYL GLYCOL (SYSTANE OP)    Apply to eye 2 (two) times daily as needed.   TIMOLOL (BETIMOL) 0.5 % OPHTHALMIC SOLUTION    Place 1 drop into both eyes daily.    VERAPAMIL (CALAN-SR) 240 MG CR  TABLET    Take 1 tablet (240 mg total) by mouth at bedtime.  Modified Medications   No medications on file  Discontinued Medications   No medications on file

## 2014-11-06 NOTE — Patient Instructions (Signed)

## 2014-11-06 NOTE — Progress Notes (Signed)
Pre visit review using our clinic review tool, if applicable. No additional management support is needed unless otherwise documented below in the visit note. 

## 2014-11-09 ENCOUNTER — Encounter: Payer: Self-pay | Admitting: Family Medicine

## 2014-11-12 DIAGNOSIS — M25511 Pain in right shoulder: Secondary | ICD-10-CM | POA: Diagnosis not present

## 2014-11-18 DIAGNOSIS — M25511 Pain in right shoulder: Secondary | ICD-10-CM | POA: Diagnosis not present

## 2014-11-20 ENCOUNTER — Ambulatory Visit: Payer: Self-pay | Admitting: Family Medicine

## 2014-11-20 ENCOUNTER — Encounter: Payer: Self-pay | Admitting: Family Medicine

## 2014-11-20 DIAGNOSIS — Z1231 Encounter for screening mammogram for malignant neoplasm of breast: Secondary | ICD-10-CM | POA: Diagnosis not present

## 2014-11-20 DIAGNOSIS — M810 Age-related osteoporosis without current pathological fracture: Secondary | ICD-10-CM | POA: Diagnosis not present

## 2014-11-20 DIAGNOSIS — Z780 Asymptomatic menopausal state: Secondary | ICD-10-CM | POA: Diagnosis not present

## 2014-11-20 DIAGNOSIS — E2839 Other primary ovarian failure: Secondary | ICD-10-CM | POA: Diagnosis not present

## 2014-11-21 DIAGNOSIS — M25511 Pain in right shoulder: Secondary | ICD-10-CM | POA: Diagnosis not present

## 2014-11-26 DIAGNOSIS — M25511 Pain in right shoulder: Secondary | ICD-10-CM | POA: Diagnosis not present

## 2014-12-03 DIAGNOSIS — M25511 Pain in right shoulder: Secondary | ICD-10-CM | POA: Diagnosis not present

## 2014-12-05 DIAGNOSIS — M25511 Pain in right shoulder: Secondary | ICD-10-CM | POA: Diagnosis not present

## 2014-12-10 DIAGNOSIS — M25511 Pain in right shoulder: Secondary | ICD-10-CM | POA: Diagnosis not present

## 2014-12-12 DIAGNOSIS — M25511 Pain in right shoulder: Secondary | ICD-10-CM | POA: Diagnosis not present

## 2014-12-14 ENCOUNTER — Emergency Department
Admit: 2014-12-14 | Disposition: A | Discharge status: Home / Self Care | Payer: Self-pay | Admitting: Emergency Medicine

## 2014-12-14 DIAGNOSIS — I517 Cardiomegaly: Secondary | ICD-10-CM | POA: Diagnosis not present

## 2014-12-14 DIAGNOSIS — R55 Syncope and collapse: Secondary | ICD-10-CM | POA: Diagnosis not present

## 2014-12-14 DIAGNOSIS — I10 Essential (primary) hypertension: Secondary | ICD-10-CM | POA: Diagnosis not present

## 2014-12-14 DIAGNOSIS — G454 Transient global amnesia: Secondary | ICD-10-CM | POA: Diagnosis not present

## 2014-12-14 DIAGNOSIS — Z7982 Long term (current) use of aspirin: Secondary | ICD-10-CM | POA: Diagnosis not present

## 2014-12-14 DIAGNOSIS — R4182 Altered mental status, unspecified: Secondary | ICD-10-CM | POA: Diagnosis not present

## 2014-12-14 LAB — URINALYSIS, COMPLETE
BACTERIA: NONE SEEN
BILIRUBIN, UR: NEGATIVE
GLUCOSE, UR: NEGATIVE mg/dL (ref 0–75)
Ketone: NEGATIVE
LEUKOCYTE ESTERASE: NEGATIVE
Nitrite: NEGATIVE
Ph: 7 (ref 4.5–8.0)
Protein: NEGATIVE
Specific Gravity: 1.004 (ref 1.003–1.030)

## 2014-12-14 LAB — DRUG SCREEN, URINE
AMPHETAMINES, UR SCREEN: NEGATIVE
BENZODIAZEPINE, UR SCRN: NEGATIVE
Barbiturates, Ur Screen: NEGATIVE
Cannabinoid 50 Ng, Ur ~~LOC~~: NEGATIVE
Cocaine Metabolite,Ur ~~LOC~~: NEGATIVE
MDMA (ECSTASY) UR SCREEN: NEGATIVE
Methadone, Ur Screen: NEGATIVE
Opiate, Ur Screen: NEGATIVE
Phencyclidine (PCP) Ur S: NEGATIVE
Tricyclic, Ur Screen: NEGATIVE

## 2014-12-14 LAB — CBC
HCT: 42.8 % (ref 35.0–47.0)
HGB: 14.2 g/dL (ref 12.0–16.0)
MCH: 29.8 pg (ref 26.0–34.0)
MCHC: 33.1 g/dL (ref 32.0–36.0)
MCV: 90 fL (ref 80–100)
Platelet: 271 10*3/uL (ref 150–440)
RBC: 4.77 10*6/uL (ref 3.80–5.20)
RDW: 13.4 % (ref 11.5–14.5)
WBC: 8.7 10*3/uL (ref 3.6–11.0)

## 2014-12-14 LAB — MAGNESIUM: Magnesium: 2.2 mg/dL

## 2014-12-14 LAB — COMPREHENSIVE METABOLIC PANEL
ALK PHOS: 67 U/L
AST: 25 U/L
Albumin: 4.7 g/dL
Anion Gap: 9 (ref 7–16)
BUN: 18 mg/dL
Bilirubin,Total: 0.2 mg/dL — ABNORMAL LOW
CALCIUM: 9.7 mg/dL
CHLORIDE: 102 mmol/L
Co2: 29 mmol/L
Creatinine: 0.78 mg/dL
EGFR (African American): >60
EGFR (Non-African Amer.): >60
Glucose: 128 mg/dL — ABNORMAL HIGH
POTASSIUM: 3.6 mmol/L
SGPT (ALT): 17 U/L
Sodium: 140 mmol/L
Total Protein: 7.8 g/dL

## 2014-12-14 LAB — TROPONIN I: Troponin-I: <0.03 ng/mL

## 2014-12-17 ENCOUNTER — Encounter: Payer: Self-pay | Admitting: Family Medicine

## 2014-12-17 ENCOUNTER — Ambulatory Visit (INDEPENDENT_AMBULATORY_CARE_PROVIDER_SITE_OTHER): Payer: Commercial Managed Care - HMO | Admitting: Family Medicine

## 2014-12-17 VITALS — BP 140/84 | HR 76 | Temp 98.1°F | Wt 189.0 lb

## 2014-12-17 DIAGNOSIS — E7800 Pure hypercholesterolemia, unspecified: Secondary | ICD-10-CM

## 2014-12-17 DIAGNOSIS — G454 Transient global amnesia: Secondary | ICD-10-CM

## 2014-12-17 DIAGNOSIS — M25511 Pain in right shoulder: Secondary | ICD-10-CM | POA: Diagnosis not present

## 2014-12-17 NOTE — Assessment & Plan Note (Signed)
Disc goals for lipids and reasons to control them Rev labs with pt Rev low sat fat diet in detail Urged pt to consider statin - now that she has had episode of TGA- poss inc risk of CVA She declines Will watch carefully  Rev low sat fat diet in detail F/u 6 mo

## 2014-12-17 NOTE — Patient Instructions (Signed)
I'm glad you are doing better  Let's watch you closely -if you have any more episodes let me know  Avoid red meat/ fried foods/ egg yolks/ fatty breakfast meats/ butter, cheese and high fat dairy/ and shellfish  - if you are open to cholesterol medication at any time please let me know  Exercise - work on healthy habits for weight loss/ maintenance  Continue your baby aspirin   Follow up with me in 6 mo

## 2014-12-17 NOTE — Progress Notes (Signed)
Pre visit review using our clinic review tool, if applicable. No additional management support is needed unless otherwise documented below in the visit note. 

## 2014-12-17 NOTE — Progress Notes (Signed)
Subjective:    Patient ID: Monica Guerrero, female    DOB: 03-28-1941, 74 y.o.   MRN: [id number]  HPI Here for ED f/u [facility])   4/10 Left church for a coughing fit  Went outside and sat down -- remembers a gentleman saying hello to her  The next thing she remembers is walking to her front door 2 hours later   Was told she was standing in the church hall [place] spoke to her and asked how she was  Thought she looked a little dazed  His wife also spoke to her pleasantly  No one witnessed her coming back into the church  Had left her coat and umbrella -did not bring home Had driven   She walked into her house and looked at the clock- alarmed at the time - called her daughters  She was upset   No HA No blurred vision  No speech changes or weakness  She seemed normal   Went to ED  EKG NSR with rate of 61  Nl cbc Glucose was 128  Nl elecrolytes  Nl cardiac enzyme and magnesium Neg drug screen  CT of the head -nl aging changes   Lab Results  Component Value Date   CHOL 248* 10/24/2014   HDL 63.60 10/24/2014   LDLCALC 165* 10/24/2014   LDLDIRECT 175.7 09/09/2013   TRIG 99.0 10/24/2014   CHOLHDL 4 10/24/2014   Declines cholesterol medication at this time Understands stroke risk   Patient Active Problem List   Diagnosis Date Noted  . 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 Estrogen deficiency 10/31/2014  . Encounter for screening mammogram for breast cancer 10/31/2014  . Vitamin D deficiency 10/31/2014  . Shoulder pain, right 10/22/2014  . Skin cancer screening 09/23/2013  . Encounter for Medicare annual wellness exam 09/13/2013  . Other screening mammogram 04/19/2011  . IRRITABLE BOWEL SYNDROME 01/22/2010  . HYPERCHOLESTEROLEMIA 10/03/2007  . GLAUCOMA 10/03/2007  . Essential hypertension 10/03/2007  . ALLERGIC RHINITIS 10/03/2007  . OSTEOARTHRITIS 10/03/2007  . Osteoporosis 10/03/2007   Past Medical History  Diagnosis Date  . Allergy   .  Hypertension   . Arthritis     Osteoarthritis  . Osteoporosis    Past Surgical History  Procedure Laterality Date  . Tonsillectomy    . Eye surgery      Bilateral cataract Surgery   History  Substance Use Topics  . Smoking status: Never Smoker   . Smokeless tobacco: Never Used  . Alcohol Use: No   Family History  Problem Relation Age of Onset  . Dementia Mother   . Cancer Father     Kidney  . Heart disease Father   . Hypertension Brother   . Heart disease Maternal Uncle   . Stroke Maternal Grandmother   . Heart disease Maternal Grandfather   . Cancer Paternal Grandmother    No Known Allergies Current Outpatient Prescriptions on File Prior to Visit  Medication Sig Dispense Refill  . acetaminophen (TYLENOL ARTHRITIS PAIN) 650 MG CR tablet Take 650 mg by mouth every 8 (eight) hours as needed. Alternates with Ibuprofen.     Marland Kitchen aspirin 81 MG tablet Take 81 mg by mouth daily.      . bisoprolol-hydrochlorothiazide (ZIAC) 2.5-6.25 MG per tablet Take 1 tablet by mouth daily. 90 tablet 3  . calcium carbonate (OS-CAL) 600 MG TABS Take 600 mg by mouth 2 (two) times daily with a meal.      . chlorpheniramine (CHLOR-TRIMETON) 4  MG tablet Take 4 mg by mouth 2 (two) times daily as needed.      . ergocalciferol (VITAMIN D2) 50000 UNITS capsule Take 1 capsule (50,000 Units total) by mouth once a week. 12 capsule 0  . ibuprofen (ADVIL,MOTRIN) 200 MG tablet Take 200 mg by mouth every 6 (six) hours as needed. Alternates with Tylenol     . loratadine (CLARITIN) 10 MG tablet Take 10 mg by mouth as directed.      . Magnesium 250 MG TABS Take 1 tablet by mouth daily.    . naproxen sodium (ANAPROX) 220 MG tablet Take 220 mg by mouth 2 (two) times daily.    . phenylephrine (NEO-SYNEPHRINE) 0.125 % nasal drops Place 1 drop into the nose as needed.    Vladimir Faster Glycol-Propyl Glycol (SYSTANE OP) Apply to eye 2 (two) times daily as needed.    . timolol (BETIMOL) 0.5 % ophthalmic solution Place 1 drop  into both eyes daily.     . verapamil (CALAN-SR) 240 MG CR tablet Take 1 tablet (240 mg total) by mouth at bedtime. 90 tablet 3   No current facility-administered medications on file prior to visit.    Review of Systems Review of Systems  Constitutional: Negative for fever, appetite change, fatigue and unexpected weight change.  Eyes: Negative for pain and visual disturbance.  Respiratory: Negative for cough and shortness of breath.   Cardiovascular: Negative for cp or palpitations    Gastrointestinal: Negative for nausea, diarrhea and constipation.  Genitourinary: Negative for urgency and frequency.  Skin: Negative for pallor or rash   Neurological: Negative for weakness, light-headedness, numbness and headaches. neg for speech or concentration problems  Hematological: Negative for adenopathy. Does not bruise/bleed easily.  Psychiatric/Behavioral: Negative for dysphoric mood. The patient is not nervous/anxious.  neg for significant stressors        Objective:   Physical Exam  Constitutional: She is oriented to person, place, and time. She appears well-developed and well-nourished. No distress.  overwt and well appearing   HENT:  Head: Normocephalic and atraumatic.  Right Ear: External ear normal.  Left Ear: External ear normal.  Nose: Nose normal.  Mouth/Throat: Oropharynx is clear and moist. No oropharyngeal exudate.  No sinus tenderness No temporal tenderness  No TMJ tenderness  Eyes: Conjunctivae and EOM are normal. Pupils are equal, round, and reactive to light. Right eye exhibits no discharge. Left eye exhibits no discharge. No scleral icterus.  No nystagmus  Neck: Normal range of motion and full passive range of motion without pain. Neck supple. No JVD present. Carotid bruit is not present. No tracheal deviation present. No thyromegaly present.  Cardiovascular: Normal rate, regular rhythm and normal heart sounds.   No murmur heard. Pulmonary/Chest: Effort normal and  breath sounds normal. No respiratory distress. She has no wheezes. She has no rales.  Abdominal: Soft. Bowel sounds are normal. She exhibits no distension and no mass. There is no tenderness.  Musculoskeletal: She exhibits no edema or tenderness.  Lymphadenopathy:    She has no cervical adenopathy.  Neurological: She is alert and oriented to person, place, and time. She has normal strength and normal reflexes. She displays no atrophy and no tremor. No cranial nerve deficit or sensory deficit. She exhibits normal muscle tone. She displays a negative Romberg sign. She displays no seizure activity. Coordination and gait normal.  No focal cerebellar signs   Skin: Skin is warm and dry. No rash noted. No pallor.  Psychiatric: She has a normal  mood and affect. Her behavior is normal. Judgment and thought content normal. Cognition and memory are normal.          Assessment & Plan:   Problem List Items Addressed This Visit      Other   HYPERCHOLESTEROLEMIA    Disc goals for lipids and reasons to control them Rev labs with pt Rev low sat fat diet in detail Urged pt to consider statin - now that she has had episode of TGA- poss inc risk of CVA She declines Will watch carefully  Rev low sat fat diet in detail F/u 6 mo      Transient global amnesia - Primary    Rev [facility] ED records/results and imaging in detail  Duration was about 2 hours  No neurol deficits and no symptoms at all Nl exam Disc nature of this- avg risk of re occurrence/ poss inc in stroke risk  Will not ref to neuro at this time but if any new symptoms dev or this re occurs-would consider Stressed imp of reducing risk of cva - incl HTN control/ wt control/ continue 81 mg asa  She declines chol med - I stressed imp of chol control -urged her to think about it

## 2014-12-17 NOTE — Assessment & Plan Note (Signed)
[facility] ED records/results and imaging in detail  Duration was about 2 hours  No neurol deficits and no symptoms at all Nl exam Disc nature of this- avg risk of re occurrence/ poss inc in stroke risk  Will not ref to neuro at this time but if any new symptoms dev or this re occurs-would consider Stressed imp of reducing risk of cva - incl HTN control/ wt control/ continue 81 mg asa  She declines chol med - I stressed imp of chol control -urged her to think about it

## 2014-12-19 DIAGNOSIS — M25511 Pain in right shoulder: Secondary | ICD-10-CM | POA: Diagnosis not present

## 2014-12-23 DIAGNOSIS — M25511 Pain in right shoulder: Secondary | ICD-10-CM | POA: Diagnosis not present

## 2014-12-26 DIAGNOSIS — M25511 Pain in right shoulder: Secondary | ICD-10-CM | POA: Diagnosis not present

## 2014-12-29 DIAGNOSIS — M25511 Pain in right shoulder: Secondary | ICD-10-CM | POA: Diagnosis not present

## 2014-12-30 DIAGNOSIS — H4011X1 Primary open-angle glaucoma, mild stage: Secondary | ICD-10-CM | POA: Diagnosis not present

## 2014-12-31 ENCOUNTER — Encounter: Payer: Self-pay | Admitting: Family Medicine

## 2015-01-01 DIAGNOSIS — M25511 Pain in right shoulder: Secondary | ICD-10-CM | POA: Diagnosis not present

## 2015-01-06 DIAGNOSIS — H4011X1 Primary open-angle glaucoma, mild stage: Secondary | ICD-10-CM | POA: Diagnosis not present

## 2015-01-07 ENCOUNTER — Encounter: Payer: Self-pay | Admitting: Family Medicine

## 2015-01-07 ENCOUNTER — Ambulatory Visit (INDEPENDENT_AMBULATORY_CARE_PROVIDER_SITE_OTHER): Payer: Commercial Managed Care - HMO | Admitting: Family Medicine

## 2015-01-07 VITALS — BP 130/78 | HR 62 | Temp 98.2°F | Ht 63.0 in | Wt 190.0 lb

## 2015-01-07 DIAGNOSIS — M7541 Impingement syndrome of right shoulder: Secondary | ICD-10-CM

## 2015-01-07 DIAGNOSIS — M75111 Incomplete rotator cuff tear or rupture of right shoulder, not specified as traumatic: Secondary | ICD-10-CM

## 2015-01-07 NOTE — Progress Notes (Signed)
Pre visit review using our clinic review tool, if applicable. No additional management support is needed unless otherwise documented below in the visit note. 

## 2015-01-07 NOTE — Progress Notes (Signed)
Dr. Frederico Hamman T. Rayen Palen, MD, [facility] [place], [address] Phone: [phone] Fax: [phone]  01/07/2015  Patient: Monica Guerrero, MRN: [id number], DOB: 1941/04/27, 74 y.o.  Primary Physician:  Loura Pardon, MD  Chief Complaint: Follow-up  Subjective:   Monica Guerrero is a 74 y.o. very pleasant female patient who presents with the following:  50% better D/c to HEP  The patient is here in follow-up for her right-sided shoulder pain.  She initially had an injury around [place], and subsequently she has continued to have some shoulder pain, some impingement symptoms and some limitation in her range of motion.  At last office visit, we reviewed some home exercises, and additionally a set her formal physical therapy.  Other prior treatments are as below.  She is doing quite a bit better today compared to last office visit and she is satisfied with her improvement.  She rates this as an approximate 50% improvement.  11/06/2014 Last OV with Owens Loffler, MD  I'm asked to evaluate the patient for right-sided shoulder pain.  The patient had an episode around [place] where she lost her balance and caught herself with her right shoulder.  At that point she had sustained some acute pain, and she had some restriction in her motion initially, but this is gradually improved over the last 2 months.  She has no significant prior history of shoulder injury.  She has tried some various NSAIDs as well as Tylenol, which helps transiently with pain.  She has not done any formal physical therapy, home rehabilitation, and she does have some stiffness and pain in a T-shirt distribution at nighttime.  Her strength is relatively preserved.  New year's eve and caught herself - has gotten part of her range of motion back.  Painful and stiff in the night.   Past Medical History, Surgical History, Social History, Family History, Problem List,  Medications, and Allergies have been reviewed and updated if relevant.  GEN: No fevers, chills. Nontoxic. Primarily MSK c/o today. MSK: Detailed in the HPI GI: tolerating PO intake without difficulty Neuro: No numbness, parasthesias, or tingling associated. Otherwise the pertinent positives of the ROS are noted above.   Objective:   BP 130/78 mmHg  Pulse 62  Temp(Src) 98.2 F (36.8 C) (Oral)  Ht 5\' 3"  (1.6 m)  Wt 190 lb (86.183 kg)  BMI 33.67 kg/m2   GEN: Well-developed,well-nourished,in no acute distress; alert,appropriate and cooperative throughout examination HEENT: Normocephalic and atraumatic without obvious abnormalities. Ears, externally no deformities PULM: Breathing comfortably in no respiratory distress EXT: No clubbing, cyanosis, or edema PSYCH: Normally interactive. Cooperative during the interview. Pleasant. Friendly and conversant. Not anxious or depressed appearing. Normal, full affect.  Shoulder: R Inspection: No muscle wasting or winging Ecchymosis/edema: neg  AC joint, scapula, clavicle: NT Cervical spine: NT, full ROM Spurling's: neg Abduction: full, 5/5, mild painful arc of motion Flexion: full, 5/5 IR, full, lift-off: 5/5 ER at neutral: full, 5/5 AC crossover: neg Neer: pos Hawkins: pos, mild Drop Test: neg Empty Can: pos Supraspinatus insertion: mild-mod T Bicipital groove: NT Speed's: neg Yergason's: neg Sulcus sign: neg Scapular dyskinesis: none C5-T1 intact  Neuro: Sensation intact Grip 5/5   Radiology: No results found.  Assessment and Plan:   Impingement syndrome of right shoulder  Partial tear of rotator cuff, right   Significantly improved, but not at baseline.  At this point, I think that we can discharge  her from physical therapy and she can do her home exercises at home.  She is still only 50% improved approximately, this is at 6 months post injury.  We talked about other options, but at this point she is not interested in  considering arthroscopy or even considering a shoulder injection.  Therefore, I'm going to discharge her to home care.  Follow-up: prn  Signed,  Masson Nalepa T. Travonna Swindle, MD   Patient's Medications  New Prescriptions   No medications on file  Previous Medications   ACETAMINOPHEN (TYLENOL ARTHRITIS PAIN) 650 MG CR TABLET    Take 650 mg by mouth every 8 (eight) hours as needed. Alternates with Ibuprofen.    ASPIRIN 81 MG TABLET    Take 81 mg by mouth daily.     BISOPROLOL-HYDROCHLOROTHIAZIDE (ZIAC) 2.5-6.25 MG PER TABLET    Take 1 tablet by mouth daily.   CALCIUM CARBONATE (OS-CAL) 600 MG TABS    Take 600 mg by mouth 2 (two) times daily with a meal.     CHLORPHENIRAMINE (CHLOR-TRIMETON) 4 MG TABLET    Take 4 mg by mouth 2 (two) times daily as needed.     ERGOCALCIFEROL (VITAMIN D2) 50000 UNITS CAPSULE    Take 1 capsule (50,000 Units total) by mouth once a week.   IBUPROFEN (ADVIL,MOTRIN) 200 MG TABLET    Take 200 mg by mouth every 6 (six) hours as needed. Alternates with Tylenol    LORATADINE (CLARITIN) 10 MG TABLET    Take 10 mg by mouth as directed.     MAGNESIUM 250 MG TABS    Take 1 tablet by mouth daily.   NAPROXEN SODIUM (ANAPROX) 220 MG TABLET    Take 220 mg by mouth 2 (two) times daily.   PHENYLEPHRINE (NEO-SYNEPHRINE) 0.125 % NASAL DROPS    Place 1 drop into the nose as needed.   POLYETHYL GLYCOL-PROPYL GLYCOL (SYSTANE OP)    Apply to eye 2 (two) times daily as needed.   TIMOLOL (BETIMOL) 0.5 % OPHTHALMIC SOLUTION    Place 1 drop into both eyes daily.    VERAPAMIL (CALAN-SR) 240 MG CR TABLET    Take 1 tablet (240 mg total) by mouth at bedtime.  Modified Medications   No medications on file  Discontinued Medications   No medications on file

## 2015-01-21 ENCOUNTER — Other Ambulatory Visit: Payer: Self-pay | Admitting: Family Medicine

## 2015-01-21 NOTE — Telephone Encounter (Signed)
She took the course and now is done - finished -do not refill  Needs to continue 2000 iu daily otc

## 2015-01-21 NOTE — Telephone Encounter (Signed)
Pt left v/m requesting cb. 

## 2015-01-21 NOTE — Telephone Encounter (Signed)
Electronic refill request, did you want pt to repeat course of Rx? Last refilled on 10/31/14 #12 with 0 refills, please advise

## 2015-01-21 NOTE — Telephone Encounter (Signed)
Pt notified of Dr. Marliss Coots comments and she said they may have sent Rx request over as a automatic refill request because she didn't request a refill. Rx declined and pt is already taking OTC vitamin D

## 2015-01-21 NOTE — Telephone Encounter (Signed)
Rx declined and left voicemail requesting pt to call office back

## 2015-06-03 ENCOUNTER — Other Ambulatory Visit: Payer: Self-pay | Admitting: *Deleted

## 2015-06-03 MED ORDER — VERAPAMIL HCL ER 240 MG PO TBCR: 240.0000 mg | EXTENDED_RELEASE_TABLET | Freq: Every day | ORAL | Status: DC

## 2015-06-03 MED ORDER — BISOPROLOL-HYDROCHLOROTHIAZIDE 2.5-6.25 MG PO TABS: 1.0000 | ORAL_TABLET | Freq: Every day | ORAL | Status: DC

## 2015-06-03 NOTE — Telephone Encounter (Signed)
Pt needs Rxs sent to mail order pharmacy, done

## 2015-06-03 NOTE — Telephone Encounter (Signed)
Pt request status of refills from [facility]; advised refills already sent to [facility] electronically. Pt voiced understanding.

## 2015-06-19 ENCOUNTER — Encounter: Payer: Self-pay | Admitting: Family Medicine

## 2015-06-19 ENCOUNTER — Ambulatory Visit (INDEPENDENT_AMBULATORY_CARE_PROVIDER_SITE_OTHER): Payer: Commercial Managed Care - HMO | Admitting: Family Medicine

## 2015-06-19 VITALS — BP 128/78 | HR 66 | Temp 97.6°F | Ht 63.0 in | Wt 191.5 lb

## 2015-06-19 DIAGNOSIS — G454 Transient global amnesia: Secondary | ICD-10-CM

## 2015-06-19 DIAGNOSIS — I10 Essential (primary) hypertension: Secondary | ICD-10-CM | POA: Diagnosis not present

## 2015-06-19 DIAGNOSIS — E7800 Pure hypercholesterolemia, unspecified: Secondary | ICD-10-CM | POA: Diagnosis not present

## 2015-06-19 DIAGNOSIS — Z23 Encounter for immunization: Secondary | ICD-10-CM

## 2015-06-19 NOTE — Progress Notes (Signed)
Pre visit review using our clinic review tool, if applicable. No additional management support is needed unless otherwise documented below in the visit note. 

## 2015-06-19 NOTE — Patient Instructions (Signed)
Alert me if any more amnesia episodes Keep working on a low cholesterol diet (Avoid red meat/ fried foods/ egg yolks/ fatty breakfast meats/ butter, cheese and high fat dairy/ and shellfish ) Keep up the exercise - goal of 5 days per week optimally - different things on different days  Continue current medicines Blood pressure is good

## 2015-06-19 NOTE — Progress Notes (Signed)
Subjective:    Patient ID: Monica Guerrero, female    DOB: Jun 26, 1941, 74 y.o.   MRN: [id number]  HPI Here for f/u of chronic medical problems   Seen for Transient Global Amnesia at last visit  No more episodes at all  Paying really close attention to what she does so she would be aware (watches her clock also)    Wt is up 1 lb Obese Trying to take care of herself - joined a gym "anytime"  She walks on a treadmill for 20 minutes and then uses some machines  So far manages 2 times per week  Over time she hopes to increase that    (she gets more exercise in the fall with yard work)    bp is stable today  No cp or palpitations or headaches or edema  No side effects to medicines  BP Readings from Last 3 Encounters:  06/19/15 128/78  01/07/15 130/78  12/17/14 140/84      Has high chol Declines med Is eating better --- very little red meat and watching cheese and fatty dairy products  Almost no fried foods  Prep foods in other ways    Lab Results  Component Value Date   CHOL 248* 10/24/2014   HDL 63.60 10/24/2014   LDLCALC 165* 10/24/2014   LDLDIRECT 175.7 09/09/2013   TRIG 99.0 10/24/2014   CHOLHDL 4 10/24/2014    Flu shot today  Patient Active Problem List   Diagnosis Date Noted  . 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 Estrogen deficiency 10/31/2014  . Encounter for screening mammogram for breast cancer 10/31/2014  . Vitamin D deficiency 10/31/2014  . Shoulder pain, right 10/22/2014  . Skin cancer screening 09/23/2013  . Encounter for Medicare annual wellness exam 09/13/2013  . Other screening mammogram 04/19/2011  . IRRITABLE BOWEL SYNDROME 01/22/2010  . HYPERCHOLESTEROLEMIA 10/03/2007  . GLAUCOMA 10/03/2007  . Essential hypertension 10/03/2007  . ALLERGIC RHINITIS 10/03/2007  . OSTEOARTHRITIS 10/03/2007  . Osteoporosis 10/03/2007   Past Medical History  Diagnosis Date  . Allergy   . Hypertension   . Arthritis     Osteoarthritis  . Osteoporosis     Past Surgical History  Procedure Laterality Date  . Tonsillectomy    . Eye surgery      Bilateral cataract Surgery   Social History  Substance Use Topics  . Smoking status: Never Smoker   . Smokeless tobacco: Never Used  . Alcohol Use: No   Family History  Problem Relation Age of Onset  . Dementia Mother   . Cancer Father     Kidney  . Heart disease Father   . Hypertension Brother   . Heart disease Maternal Uncle   . Stroke Maternal Grandmother   . Heart disease Maternal Grandfather   . Cancer Paternal Grandmother    No Known Allergies Current Outpatient Prescriptions on File Prior to Visit  Medication Sig Dispense Refill  . acetaminophen (TYLENOL ARTHRITIS PAIN) 650 MG CR tablet Take 650 mg by mouth every 8 (eight) hours as needed. Alternates with Ibuprofen.     Marland Kitchen aspirin 81 MG tablet Take 81 mg by mouth daily.      . bisoprolol-hydrochlorothiazide (ZIAC) 2.5-6.25 MG tablet Take 1 tablet by mouth daily. 90 tablet 1  . calcium carbonate (OS-CAL) 600 MG TABS Take 600 mg by mouth 2 (two) times daily with a meal.      . chlorpheniramine (CHLOR-TRIMETON) 4 MG tablet Take 4 mg by mouth 2 (two)  times daily as needed.      Marland Kitchen ibuprofen (ADVIL,MOTRIN) 200 MG tablet Take 200 mg by mouth every 6 (six) hours as needed. Alternates with Tylenol     . loratadine (CLARITIN) 10 MG tablet Take 10 mg by mouth as directed.      . Magnesium 250 MG TABS Take 1 tablet by mouth daily.    . naproxen sodium (ANAPROX) 220 MG tablet Take 220 mg by mouth 2 (two) times daily.    . phenylephrine (NEO-SYNEPHRINE) 0.125 % nasal drops Place 1 drop into the nose as needed.    Vladimir Faster Glycol-Propyl Glycol (SYSTANE OP) Apply to eye 2 (two) times daily as needed.    . timolol (BETIMOL) 0.5 % ophthalmic solution Place 1 drop into both eyes daily.     . verapamil (CALAN-SR) 240 MG CR tablet Take 1 tablet (240 mg total) by mouth at bedtime. 90 tablet 1   No current facility-administered medications on file  prior to visit.    Review of Systems Review of Systems  Constitutional: Negative for fever, appetite change, fatigue and unexpected weight change.  Eyes: Negative for pain and visual disturbance.  Respiratory: Negative for cough and shortness of breath.   Cardiovascular: Negative for cp or palpitations    Gastrointestinal: Negative for nausea, diarrhea and constipation.  Genitourinary: Negative for urgency and frequency.  Skin: Negative for pallor or rash   Neurological: Negative for weakness, light-headedness, numbness and headaches.  Hematological: Negative for adenopathy. Does not bruise/bleed easily.  Psychiatric/Behavioral: Negative for dysphoric mood. The patient is not nervous/anxious.  neg for memory loss        Objective:   Physical Exam  Constitutional: She appears well-developed and well-nourished. No distress.  obese and well appearing   HENT:  Head: Normocephalic and atraumatic.  Mouth/Throat: Oropharynx is clear and moist.  Eyes: Conjunctivae and EOM are normal. Pupils are equal, round, and reactive to light.  Neck: Normal range of motion. Neck supple. No JVD present. Carotid bruit is not present. No thyromegaly present.  Cardiovascular: Normal rate, regular rhythm, normal heart sounds and intact distal pulses.  Exam reveals no gallop.   Pulmonary/Chest: Effort normal and breath sounds normal. No respiratory distress. She has no wheezes. She has no rales.  No crackles  Abdominal: Soft. Bowel sounds are normal. She exhibits no distension, no abdominal bruit and no mass. There is no tenderness.  Musculoskeletal: She exhibits no edema.  Lymphadenopathy:    She has no cervical adenopathy.  Neurological: She is alert. She has normal strength and normal reflexes. She displays no atrophy. No cranial nerve deficit or sensory deficit. She exhibits normal muscle tone. Gait normal.  Skin: Skin is warm and dry. No rash noted.  Psychiatric: She has a normal mood and affect.           Assessment & Plan:   Problem List Items Addressed This Visit      Cardiovascular and Mediastinum   Essential hypertension    bp in fair control at this time  BP Readings from Last 1 Encounters:  06/19/15 128/78   No changes needed Disc lifstyle change with low sodium diet and exercise  Last labs reviewed         Other   HYPERCHOLESTEROLEMIA    Disc goals for lipids and reasons to control them Rev labs with pt Rev low sat fat diet in detail Stressed imp of diet since she declines medication       Transient global amnesia  No further episodes which is reassuring  Will continue to control cholesterol  Continue asa  Cholesterol control with diet (declines medication)        Other Visit Diagnoses    Need for influenza vaccination    -  Primary    Relevant Orders    Flu Vaccine QUAD 36+ mos PF IM (Fluarix & Fluzone Quad PF) (Completed)

## 2015-06-21 NOTE — Assessment & Plan Note (Signed)
Disc goals for lipids and reasons to control them Rev labs with pt Rev low sat fat diet in detail Stressed imp of diet since she declines medication

## 2015-06-21 NOTE — Assessment & Plan Note (Signed)
bp in fair control at this time  BP Readings from Last 1 Encounters:  06/19/15 128/78   No changes needed Disc lifstyle change with low sodium diet and exercise  Last labs reviewed

## 2015-06-21 NOTE — Assessment & Plan Note (Signed)
No further episodes which is reassuring  Will continue to control cholesterol  Continue asa  Cholesterol control with diet (declines medication)

## 2015-07-06 DIAGNOSIS — H401131 Primary open-angle glaucoma, bilateral, mild stage: Secondary | ICD-10-CM | POA: Diagnosis not present

## 2015-07-13 DIAGNOSIS — H401131 Primary open-angle glaucoma, bilateral, mild stage: Secondary | ICD-10-CM | POA: Diagnosis not present

## 2015-10-22 DIAGNOSIS — L821 Other seborrheic keratosis: Secondary | ICD-10-CM | POA: Diagnosis not present

## 2015-10-22 DIAGNOSIS — L570 Actinic keratosis: Secondary | ICD-10-CM | POA: Diagnosis not present

## 2015-10-22 DIAGNOSIS — D1800 Hemangioma unspecified site: Secondary | ICD-10-CM | POA: Diagnosis not present

## 2015-10-22 DIAGNOSIS — D229 Melanocytic nevi, unspecified: Secondary | ICD-10-CM | POA: Diagnosis not present

## 2015-10-22 DIAGNOSIS — L578 Other skin changes due to chronic exposure to nonionizing radiation: Secondary | ICD-10-CM | POA: Diagnosis not present

## 2015-10-22 DIAGNOSIS — Z1283 Encounter for screening for malignant neoplasm of skin: Secondary | ICD-10-CM | POA: Diagnosis not present

## 2015-10-22 DIAGNOSIS — L918 Other hypertrophic disorders of the skin: Secondary | ICD-10-CM | POA: Diagnosis not present

## 2015-10-22 DIAGNOSIS — Z85828 Personal history of other malignant neoplasm of skin: Secondary | ICD-10-CM | POA: Diagnosis not present

## 2015-10-22 DIAGNOSIS — I8393 Asymptomatic varicose veins of bilateral lower extremities: Secondary | ICD-10-CM | POA: Diagnosis not present

## 2015-11-29 ENCOUNTER — Telehealth: Payer: Self-pay | Admitting: Family Medicine

## 2015-11-29 DIAGNOSIS — E7800 Pure hypercholesterolemia, unspecified: Secondary | ICD-10-CM

## 2015-11-29 DIAGNOSIS — E559 Vitamin D deficiency, unspecified: Secondary | ICD-10-CM

## 2015-11-29 DIAGNOSIS — I10 Essential (primary) hypertension: Secondary | ICD-10-CM

## 2015-11-29 NOTE — Telephone Encounter (Signed)
-----   Message from Ellamae Sia sent at 11/20/2015 11:29 AM EDT ----- Regarding: lab orders for Tuesday, 3.28.17  AWV lab orders, please.

## 2015-12-01 ENCOUNTER — Other Ambulatory Visit (INDEPENDENT_AMBULATORY_CARE_PROVIDER_SITE_OTHER): Payer: Commercial Managed Care - HMO

## 2015-12-01 DIAGNOSIS — I10 Essential (primary) hypertension: Secondary | ICD-10-CM | POA: Diagnosis not present

## 2015-12-01 DIAGNOSIS — E7800 Pure hypercholesterolemia, unspecified: Secondary | ICD-10-CM | POA: Diagnosis not present

## 2015-12-01 DIAGNOSIS — E559 Vitamin D deficiency, unspecified: Secondary | ICD-10-CM | POA: Diagnosis not present

## 2015-12-01 LAB — LIPID PANEL
CHOLESTEROL: 228 mg/dL — AB (ref 0–200)
HDL: 63.3 mg/dL (ref >39.00)
LDL Cholesterol: 150 mg/dL — ABNORMAL HIGH (ref 0–99)
NonHDL: 164.41
Total CHOL/HDL Ratio: 4
Triglycerides: 70 mg/dL (ref 0.0–149.0)
VLDL: 14 mg/dL (ref 0.0–40.0)

## 2015-12-01 LAB — CBC WITH DIFFERENTIAL/PLATELET
Basophils Absolute: 0 10*3/uL (ref 0.0–0.1)
Basophils Relative: 0.7 % (ref 0.0–3.0)
EOS ABS: 0.2 10*3/uL (ref 0.0–0.7)
EOS PCT: 5.2 % — AB (ref 0.0–5.0)
HCT: 41.5 % (ref 36.0–46.0)
Hemoglobin: 14 g/dL (ref 12.0–15.0)
Lymphocytes Relative: 33.4 % (ref 12.0–46.0)
Lymphs Abs: 1.5 10*3/uL (ref 0.7–4.0)
MCHC: 33.8 g/dL (ref 30.0–36.0)
MCV: 88.6 fl (ref 78.0–100.0)
MONO ABS: 0.4 10*3/uL (ref 0.1–1.0)
Monocytes Relative: 9.6 % (ref 3.0–12.0)
Neutro Abs: 2.3 10*3/uL (ref 1.4–7.7)
Neutrophils Relative %: 51.1 % (ref 43.0–77.0)
Platelets: 237 10*3/uL (ref 150.0–400.0)
RBC: 4.69 Mil/uL (ref 3.87–5.11)
RDW: 13.6 % (ref 11.5–15.5)
WBC: 4.5 10*3/uL (ref 4.0–10.5)

## 2015-12-01 LAB — COMPREHENSIVE METABOLIC PANEL
ALBUMIN: 4.3 g/dL (ref 3.5–5.2)
ALK PHOS: 56 U/L (ref 39–117)
ALT: 16 U/L (ref 0–35)
AST: 21 U/L (ref 0–37)
BUN: 25 mg/dL — AB (ref 6–23)
CO2: 33 mEq/L — ABNORMAL HIGH (ref 19–32)
CREATININE: 0.86 mg/dL (ref 0.40–1.20)
Calcium: 9.7 mg/dL (ref 8.4–10.5)
Chloride: 103 mEq/L (ref 96–112)
GFR: 68.54 mL/min (ref >60.00)
Glucose, Bld: 100 mg/dL — ABNORMAL HIGH (ref 70–99)
POTASSIUM: 4 meq/L (ref 3.5–5.1)
Sodium: 141 mEq/L (ref 135–145)
Total Bilirubin: 0.7 mg/dL (ref 0.2–1.2)
Total Protein: 6.9 g/dL (ref 6.0–8.3)

## 2015-12-01 LAB — TSH: TSH: 4.12 u[IU]/mL (ref 0.35–4.50)

## 2015-12-01 LAB — VITAMIN D 25 HYDROXY (VIT D DEFICIENCY, FRACTURES): VITD: 43.86 ng/mL (ref 30.00–100.00)

## 2015-12-04 ENCOUNTER — Other Ambulatory Visit: Payer: Self-pay | Admitting: Family Medicine

## 2015-12-08 ENCOUNTER — Encounter: Payer: Self-pay | Admitting: Family Medicine

## 2015-12-08 ENCOUNTER — Ambulatory Visit (INDEPENDENT_AMBULATORY_CARE_PROVIDER_SITE_OTHER): Payer: Commercial Managed Care - HMO | Admitting: Family Medicine

## 2015-12-08 VITALS — BP 124/66 | HR 51 | Temp 97.6°F | Ht 63.0 in | Wt 188.2 lb

## 2015-12-08 DIAGNOSIS — E7800 Pure hypercholesterolemia, unspecified: Secondary | ICD-10-CM

## 2015-12-08 DIAGNOSIS — M810 Age-related osteoporosis without current pathological fracture: Secondary | ICD-10-CM | POA: Diagnosis not present

## 2015-12-08 DIAGNOSIS — I10 Essential (primary) hypertension: Secondary | ICD-10-CM

## 2015-12-08 DIAGNOSIS — E559 Vitamin D deficiency, unspecified: Secondary | ICD-10-CM

## 2015-12-08 DIAGNOSIS — Z0000 Encounter for general adult medical examination without abnormal findings: Secondary | ICD-10-CM | POA: Diagnosis not present

## 2015-12-08 DIAGNOSIS — K219 Gastro-esophageal reflux disease without esophagitis: Secondary | ICD-10-CM | POA: Insufficient documentation

## 2015-12-08 NOTE — Assessment & Plan Note (Signed)
bp in fair control at this time  BP Readings from Last 1 Encounters:  12/08/15 124/66   No changes needed Disc lifstyle change with low sodium diet and exercise  Labs reviewed

## 2015-12-08 NOTE — Assessment & Plan Note (Signed)
With some symptoms of gastritis intermittently  Enc to stop all nsaids Diet disc  Inc ranitidine to 150 mg bid for 2 mo and then start to wean if symptom free with lifestyle change If no improvement or worse-f/u

## 2015-12-08 NOTE — Patient Instructions (Signed)
Labs are stable Keep working on healthy diet and exercise  Don't forget to schedule your mammogram  Please do the cologuard kit for colon screening  Increase the ranitidine to 150 mg twice daily for at least 2 months and watch your diet - if you are doing well you can start cutting the dose  Ibuprofen/ aleve (naproxen) and aspirin all cause gastritis and acid reflux - you need to take a break from any of those (nsaids) and take tylenol instead

## 2015-12-08 NOTE — Assessment & Plan Note (Signed)
Reviewed health habits including diet and exercise and skin cancer prevention Reviewed appropriate screening tests for age  Also reviewed health mt list, fam hx and immunization status , as well as social and family history   See HPI Labs reviewed Labs are stable Keep working on healthy diet and exercise  Don't forget to schedule your mammogram  Please do the cologuard kit for colon screening  Increase the ranitidine to 150 mg twice daily for at least 2 months and watch your diet - if you are doing well you can start cutting the dose  Ibuprofen/ aleve (naproxen) and aspirin all cause gastritis and acid reflux - you need to take a break from any of those (nsaids) and take tylenol instead

## 2015-12-08 NOTE — Assessment & Plan Note (Signed)
Some improvement with better diet Disc goals for lipids and reasons to control them Rev labs with pt Rev low sat fat diet in detail Pt declines tx of any kind

## 2015-12-08 NOTE — Progress Notes (Signed)
Subjective:    Patient ID: Monica Guerrero, female    DOB: [birth year], 75 y.o.   MRN: [id number]  HPI Here for annual medicare wellness visit as well as chronic/acute medical problems as well as annual preventative exam  I have personally reviewed the Medicare Annual Wellness questionnaire and have noted 1. The patient's medical and social history 2. Their use of alcohol, tobacco or illicit drugs 3. Their current medications and supplements 4. The patient's functional ability including ADL's, fall risks, home safety risks and hearing or visual             impairment. 5. Diet and physical activities 6. Evidence for depression or mood disorders  The patients weight, height, BMI have been recorded in the chart and visual acuity is per eye clinic.  I have made referrals, counseling and provided education to the patient based review of the above and I have provided the pt with a written personalized care plan for preventive services. Reviewed and updated provider list, see scanned forms.  See scanned forms.  Routine anticipatory guidance given to patient.  See health maintenance. Colon cancer screening normal in 2011- unsure if they will do another one  Gyn -no symptoms at all  Breast cancer screening mammogram was 3/16 nl - goes to [facility] -she will schedule herself  Self breast exam-no lumps or changes  Flu vaccine 10/16 Tetanus vaccine 12/13 Pneumovax - complete on both  Zoster vaccine 6/12  dexa was 3/16 - has been on evista Vit D level is good at 43-taking her vit D Exercise-she goes to the gym or works in the yard and cares for a 14 mo old twice weeky- very physical with lifting as well One trip and almost fall going up the stairs a week ago -no injuries No fractures  Advance directive-has a living will and power of attorney  Cognitive function addressed- see scanned forms- and if abnormal then additional documentation follows. Thinks she does fairly well with memory-no concerns     PMH and SH reviewed  Meds, vitals, and allergies reviewed.   ROS: See HPI.  Otherwise negative.    She has some issues with bloating/heartburn- wonders about gastritis  Greasy food and spices aggravate her  No right sided pain (she does still have gallbladder) Taking otc ranitidine 150 qd and also gas relief  Keeps tums by the bed  It does help    bp is stable today  No cp or palpitations or headaches or edema  No side effects to medicines  BP Readings from Last 3 Encounters:  12/08/15 124/66  06/19/15 128/78  01/07/15 130/78        Chemistry      Component Value Date/Time   NA 141 12/01/2015 0904   NA 140 12/14/2014 1553   K 4.0 12/01/2015 0904   K 3.6 12/14/2014 1553   CL 103 12/01/2015 0904   CL 102 12/14/2014 1553   CO2 33* 12/01/2015 0904   CO2 29 12/14/2014 1553   BUN 25* 12/01/2015 0904   BUN 18 12/14/2014 1553   CREATININE 0.86 12/01/2015 0904   CREATININE 0.78 12/14/2014 1553      Component Value Date/Time   CALCIUM 9.7 12/01/2015 0904   CALCIUM 9.7 12/14/2014 1553   ALKPHOS 56 12/01/2015 0904   ALKPHOS 67 12/14/2014 1553   AST 21 12/01/2015 0904   AST 25 12/14/2014 1553   ALT 16 12/01/2015 0904   ALT 17 12/14/2014 1553   BILITOT 0.7 12/01/2015 0904  BILITOT 0.2* 12/14/2014 1553    Glucose was 100  Hyperlipidemia Lab Results  Component Value Date   CHOL 228* 12/01/2015   CHOL 248* 10/24/2014   CHOL 253* 09/09/2013   Lab Results  Component Value Date   HDL 63.30 12/01/2015   HDL 63.60 10/24/2014   HDL 63.00 09/09/2013   Lab Results  Component Value Date   LDLCALC 150* 12/01/2015   LDLCALC 165* 10/24/2014   Lab Results  Component Value Date   TRIG 70.0 12/01/2015   TRIG 99.0 10/24/2014   TRIG 91.0 09/09/2013   Lab Results  Component Value Date   CHOLHDL 4 12/01/2015   CHOLHDL 4 10/24/2014   CHOLHDL 4 09/09/2013   Lab Results  Component Value Date   LDLDIRECT 175.7 09/09/2013   LDLDIRECT 184.5 08/01/2012   LDLDIRECT  169.6 10/20/2011   LDL is down  Watching diet - she declines medication of any kind   Lab Results  Component Value Date   TSH 4.12 12/01/2015    Patient Active Problem List   Diagnosis Date Noted  . Routine general medical examination at a health care facility 12/08/2015  . 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 Estrogen deficiency 10/31/2014  . Encounter for screening mammogram for breast cancer 10/31/2014  . Vitamin D deficiency 10/31/2014  . Shoulder pain, right 10/22/2014  . Skin cancer screening 09/23/2013  . Encounter for Medicare annual wellness exam 09/13/2013  . Other screening mammogram 04/19/2011  . IRRITABLE BOWEL SYNDROME 01/22/2010  . HYPERCHOLESTEROLEMIA 10/03/2007  . GLAUCOMA 10/03/2007  . Essential hypertension 10/03/2007  . ALLERGIC RHINITIS 10/03/2007  . OSTEOARTHRITIS 10/03/2007  . Osteoporosis 10/03/2007   Past Medical History  Diagnosis Date  . Allergy   . Hypertension   . Arthritis     Osteoarthritis  . Osteoporosis    Past Surgical History  Procedure Laterality Date  . Tonsillectomy    . Eye surgery      Bilateral cataract Surgery   Social History  Substance Use Topics  . Smoking status: Never Smoker   . Smokeless tobacco: Never Used  . Alcohol Use: No   Family History  Problem Relation Age of Onset  . Dementia Mother   . Cancer Father     Kidney  . Heart disease Father   . Hypertension Brother   . Heart disease Maternal Uncle   . Stroke Maternal Grandmother   . Heart disease Maternal Grandfather   . Cancer Paternal Grandmother    No Known Allergies Current Outpatient Prescriptions on File Prior to Visit  Medication Sig Dispense Refill  . acetaminophen (TYLENOL ARTHRITIS PAIN) 650 MG CR tablet Take 650 mg by mouth every 8 (eight) hours as needed. Alternates with Ibuprofen.     Marland Kitchen aspirin 81 MG tablet Take 81 mg by mouth daily.      . bisoprolol-hydrochlorothiazide (ZIAC) 2.5-6.25 MG tablet TAKE 1 TABLET BY MOUTH DAILY. 90 tablet  1  . calcium carbonate (OS-CAL) 600 MG TABS Take 600 mg by mouth 2 (two) times daily with a meal.      . chlorpheniramine (CHLOR-TRIMETON) 4 MG tablet Take 4 mg by mouth 2 (two) times daily as needed.      . cholecalciferol (VITAMIN D) 1000 UNITS tablet Take 2,000 Units by mouth daily.    Marland Kitchen ibuprofen (ADVIL,MOTRIN) 200 MG tablet Take 200 mg by mouth every 6 (six) hours as needed. Alternates with Tylenol     . loratadine (CLARITIN) 10 MG tablet Take 10 mg by mouth as  directed.      . Magnesium 250 MG TABS Take 1 tablet by mouth daily.    . naproxen sodium (ANAPROX) 220 MG tablet Take 220 mg by mouth 2 (two) times daily.    . phenylephrine (NEO-SYNEPHRINE) 0.125 % nasal drops Place 1 drop into the nose as needed.    Vladimir Faster Glycol-Propyl Glycol (SYSTANE OP) Apply to eye 2 (two) times daily as needed.    . timolol (BETIMOL) 0.5 % ophthalmic solution Place 1 drop into both eyes daily.     . verapamil (CALAN-SR) 240 MG CR tablet TAKE 1 TABLET AT BEDTIME 90 tablet 1   No current facility-administered medications on file prior to visit.    Review of Systems    Review of Systems  Constitutional: Negative for fever, appetite change, fatigue and unexpected weight change.  Eyes: Negative for pain and visual disturbance.  Respiratory: Negative for cough and shortness of breath.   Cardiovascular: Negative for cp or palpitations    Gastrointestinal: Negative for nausea, diarrhea and constipation. pos for heartburn and stomach discomfort and indigestion Genitourinary: Negative for urgency and frequency.  Skin: Negative for pallor or rash   Neurological: Negative for weakness, light-headedness, numbness and headaches.  Hematological: Negative for adenopathy. Does not bruise/bleed easily.  Psychiatric/Behavioral: Negative for dysphoric mood. The patient is not nervous/anxious.      Objective:   Physical Exam  Constitutional: She appears well-developed and well-nourished. No distress.  obese and  well appearing   HENT:  Head: Normocephalic and atraumatic.  Right Ear: External ear normal.  Left Ear: External ear normal.  Mouth/Throat: Oropharynx is clear and moist.  Eyes: Conjunctivae and EOM are normal. Pupils are equal, round, and reactive to light. No scleral icterus.  Neck: Normal range of motion. Neck supple. No JVD present. Carotid bruit is not present. No thyromegaly present.  Cardiovascular: Normal rate, regular rhythm, normal heart sounds and intact distal pulses.  Exam reveals no gallop.   Pulmonary/Chest: Effort normal and breath sounds normal. No respiratory distress. She has no wheezes. She exhibits no tenderness.  Abdominal: Soft. Bowel sounds are normal. She exhibits no distension, no abdominal bruit and no mass. There is no tenderness.  Genitourinary: No breast swelling, tenderness, discharge or bleeding.  Breast exam: No mass, nodules, thickening, tenderness, bulging, retraction, inflamation, nipple discharge or skin changes noted.  No axillary or clavicular LA.      Musculoskeletal: Normal range of motion. She exhibits no edema or tenderness.  No kyphosis  Lymphadenopathy:    She has no cervical adenopathy.  Neurological: She is alert. She has normal reflexes. No cranial nerve deficit. She exhibits normal muscle tone. Coordination normal.  Skin: Skin is warm and dry. No rash noted. No erythema. No pallor.  Psychiatric: She has a normal mood and affect.          Assessment & Plan:   Problem List Items Addressed This Visit      Cardiovascular and Mediastinum   Essential hypertension - Primary    bp in fair control at this time  BP Readings from Last 1 Encounters:  12/08/15 124/66   No changes needed Disc lifstyle change with low sodium diet and exercise  Labs reviewed         Digestive   GERD (gastroesophageal reflux disease)    With some symptoms of gastritis intermittently  Enc to stop all nsaids Diet disc  Inc ranitidine to 150 mg bid for 2 mo  and then start to wean  if symptom free with lifestyle change If no improvement or worse-f/u        Musculoskeletal and Integument   Osteoporosis    dexa 3/16 stable S/p 5 y of evista  No fractures D level is tx   Disc need for calcium/ vitamin D/ wt bearing exercise and bone density test every 2 y to monitor Disc safety/ fracture risk in detail           Other   Encounter for Medicare annual wellness exam    Reviewed health habits including diet and exercise and skin cancer prevention Reviewed appropriate screening tests for age  Also reviewed health mt list, fam hx and immunization status , as well as social and family history   See HPI Labs reviewed Labs are stable Keep working on healthy diet and exercise  Don't forget to schedule your mammogram  Please do the cologuard kit for colon screening  Increase the ranitidine to 150 mg twice daily for at least 2 months and watch your diet - if you are doing well you can start cutting the dose  Ibuprofen/ aleve (naproxen) and aspirin all cause gastritis and acid reflux - you need to take a break from any of those (nsaids) and take tylenol instead       HYPERCHOLESTEROLEMIA    Some improvement with better diet Disc goals for lipids and reasons to control them Rev labs with pt Rev low sat fat diet in detail Pt declines tx of any kind       Routine general medical examination at a health care facility    Reviewed health habits including diet and exercise and skin cancer prevention Reviewed appropriate screening tests for age  Also reviewed health mt list, fam hx and immunization status , as well as social and family history   See HPI Labs reviewed Labs are stable Keep working on healthy diet and exercise  Don't forget to schedule your mammogram  Please do the cologuard kit for colon screening  Increase the ranitidine to 150 mg twice daily for at least 2 months and watch your diet - if you are doing well you can start cutting  the dose  Ibuprofen/ aleve (naproxen) and aspirin all cause gastritis and acid reflux - you need to take a break from any of those (nsaids) and take tylenol instead       Vitamin D deficiency    Vitamin D level is therapeutic with current supplementation Disc importance of this to bone and overall health

## 2015-12-08 NOTE — Assessment & Plan Note (Signed)
Vitamin D level is therapeutic with current supplementation Disc importance of this to bone and overall health  

## 2015-12-08 NOTE — Assessment & Plan Note (Signed)
dexa 3/16 stable S/p 5 y of evista  No fractures D level is tx   Disc need for calcium/ vitamin D/ wt bearing exercise and bone density test every 2 y to monitor Disc safety/ fracture risk in detail

## 2015-12-08 NOTE — Progress Notes (Signed)
Pre visit review using our clinic review tool, if applicable. No additional management support is needed unless otherwise documented below in the visit note. 

## 2015-12-10 ENCOUNTER — Other Ambulatory Visit: Payer: Self-pay | Admitting: Family Medicine

## 2015-12-10 DIAGNOSIS — Z1231 Encounter for screening mammogram for malignant neoplasm of breast: Secondary | ICD-10-CM

## 2015-12-15 DIAGNOSIS — Z1212 Encounter for screening for malignant neoplasm of rectum: Secondary | ICD-10-CM | POA: Diagnosis not present

## 2015-12-15 DIAGNOSIS — Z1211 Encounter for screening for malignant neoplasm of colon: Secondary | ICD-10-CM | POA: Diagnosis not present

## 2015-12-17 ENCOUNTER — Ambulatory Visit
Admission: RE | Admit: 2015-12-17 | Discharge: 2015-12-17 | Disposition: A | Discharge status: Home / Self Care | Payer: Commercial Managed Care - HMO | Source: Ambulatory Visit | Attending: Family Medicine | Admitting: Family Medicine

## 2015-12-17 ENCOUNTER — Other Ambulatory Visit: Payer: Self-pay | Admitting: Family Medicine

## 2015-12-17 DIAGNOSIS — Z1231 Encounter for screening mammogram for malignant neoplasm of breast: Secondary | ICD-10-CM

## 2015-12-27 LAB — COLOGUARD: Cologuard: NEGATIVE

## 2016-01-07 ENCOUNTER — Encounter: Payer: Self-pay | Admitting: *Deleted

## 2016-01-11 DIAGNOSIS — H401131 Primary open-angle glaucoma, bilateral, mild stage: Secondary | ICD-10-CM | POA: Diagnosis not present

## 2016-05-03 ENCOUNTER — Other Ambulatory Visit: Payer: Self-pay | Admitting: Family Medicine

## 2016-06-07 ENCOUNTER — Ambulatory Visit (INDEPENDENT_AMBULATORY_CARE_PROVIDER_SITE_OTHER): Payer: Commercial Managed Care - HMO

## 2016-06-07 DIAGNOSIS — Z23 Encounter for immunization: Secondary | ICD-10-CM | POA: Diagnosis not present

## 2016-07-11 DIAGNOSIS — H401131 Primary open-angle glaucoma, bilateral, mild stage: Secondary | ICD-10-CM | POA: Diagnosis not present

## 2016-07-18 DIAGNOSIS — H401131 Primary open-angle glaucoma, bilateral, mild stage: Secondary | ICD-10-CM | POA: Diagnosis not present

## 2016-10-14 IMAGING — CR DG CHEST 2V
1 series · 2 of 2 positions shown · non-contrast
Comparison: None.

CLINICAL DATA: Confusion.  Memory loss.  Altered mental status.

EXAM:
CHEST  2 VIEW

[Series 1: dxr chest pa (or ap) and lateral · 0.14mm/px · 2 of 2 slices shown]
[im 1/2]
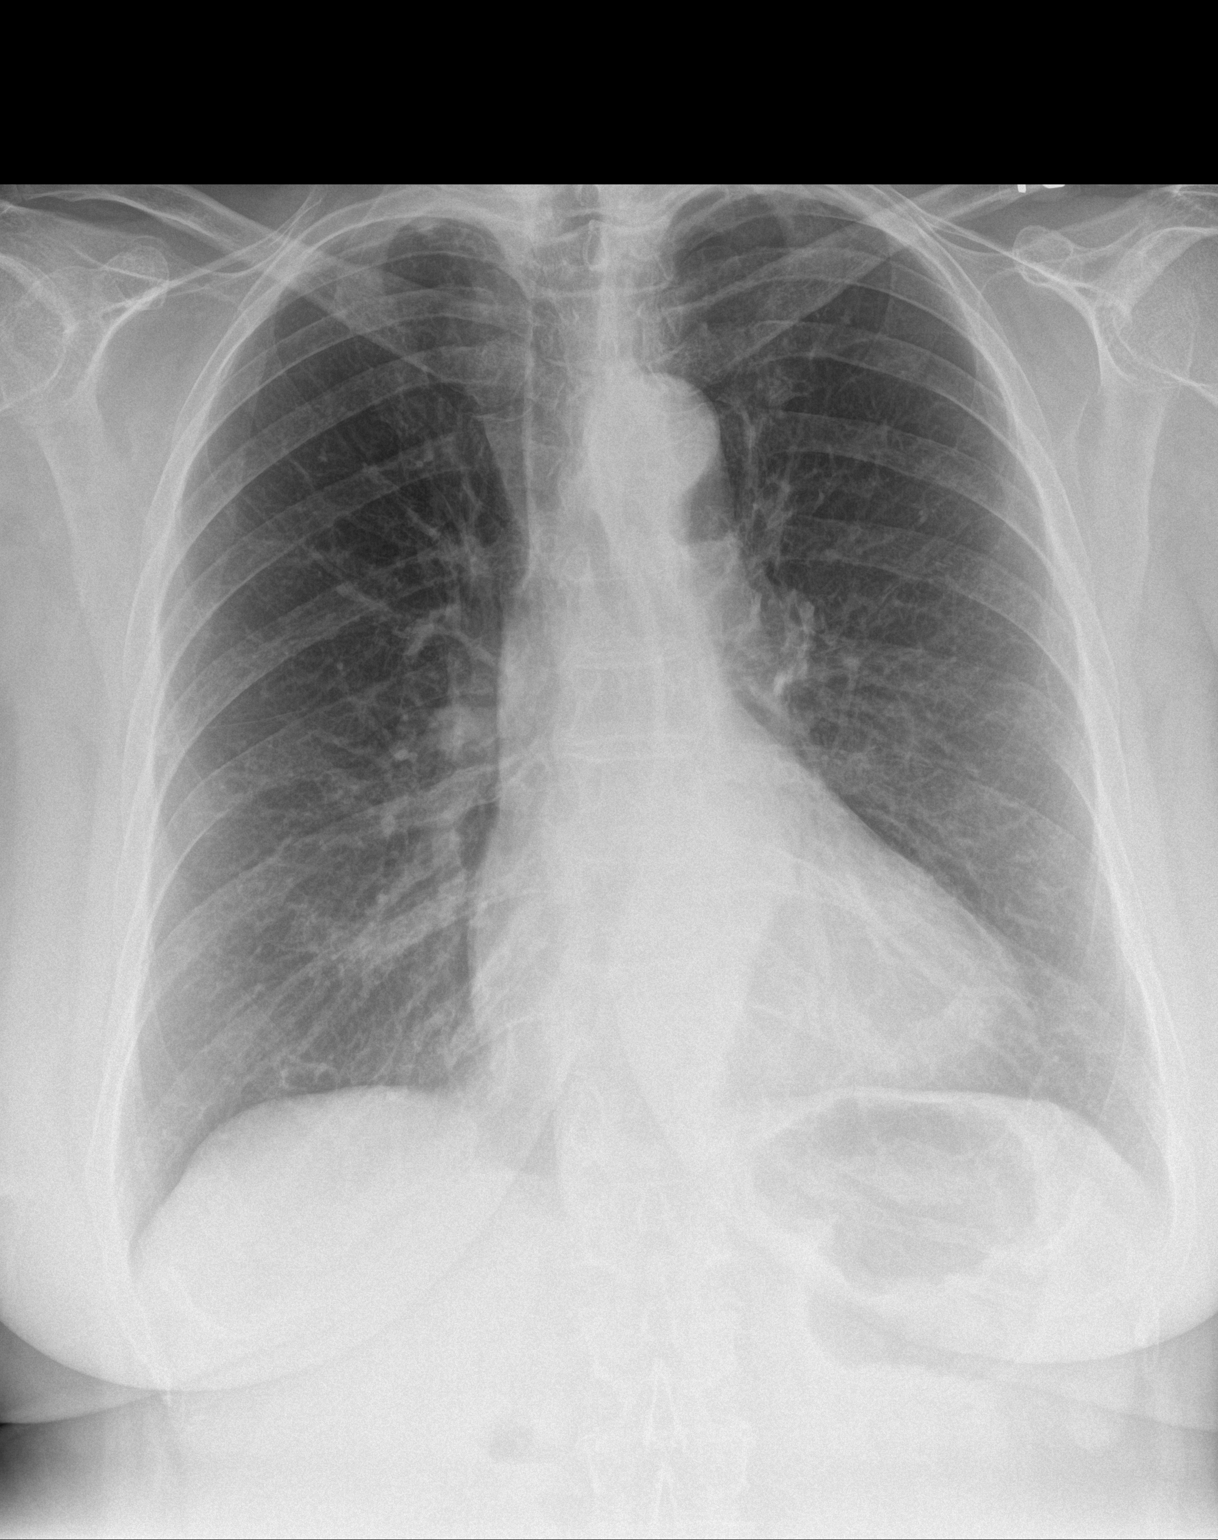
[im 2/2]
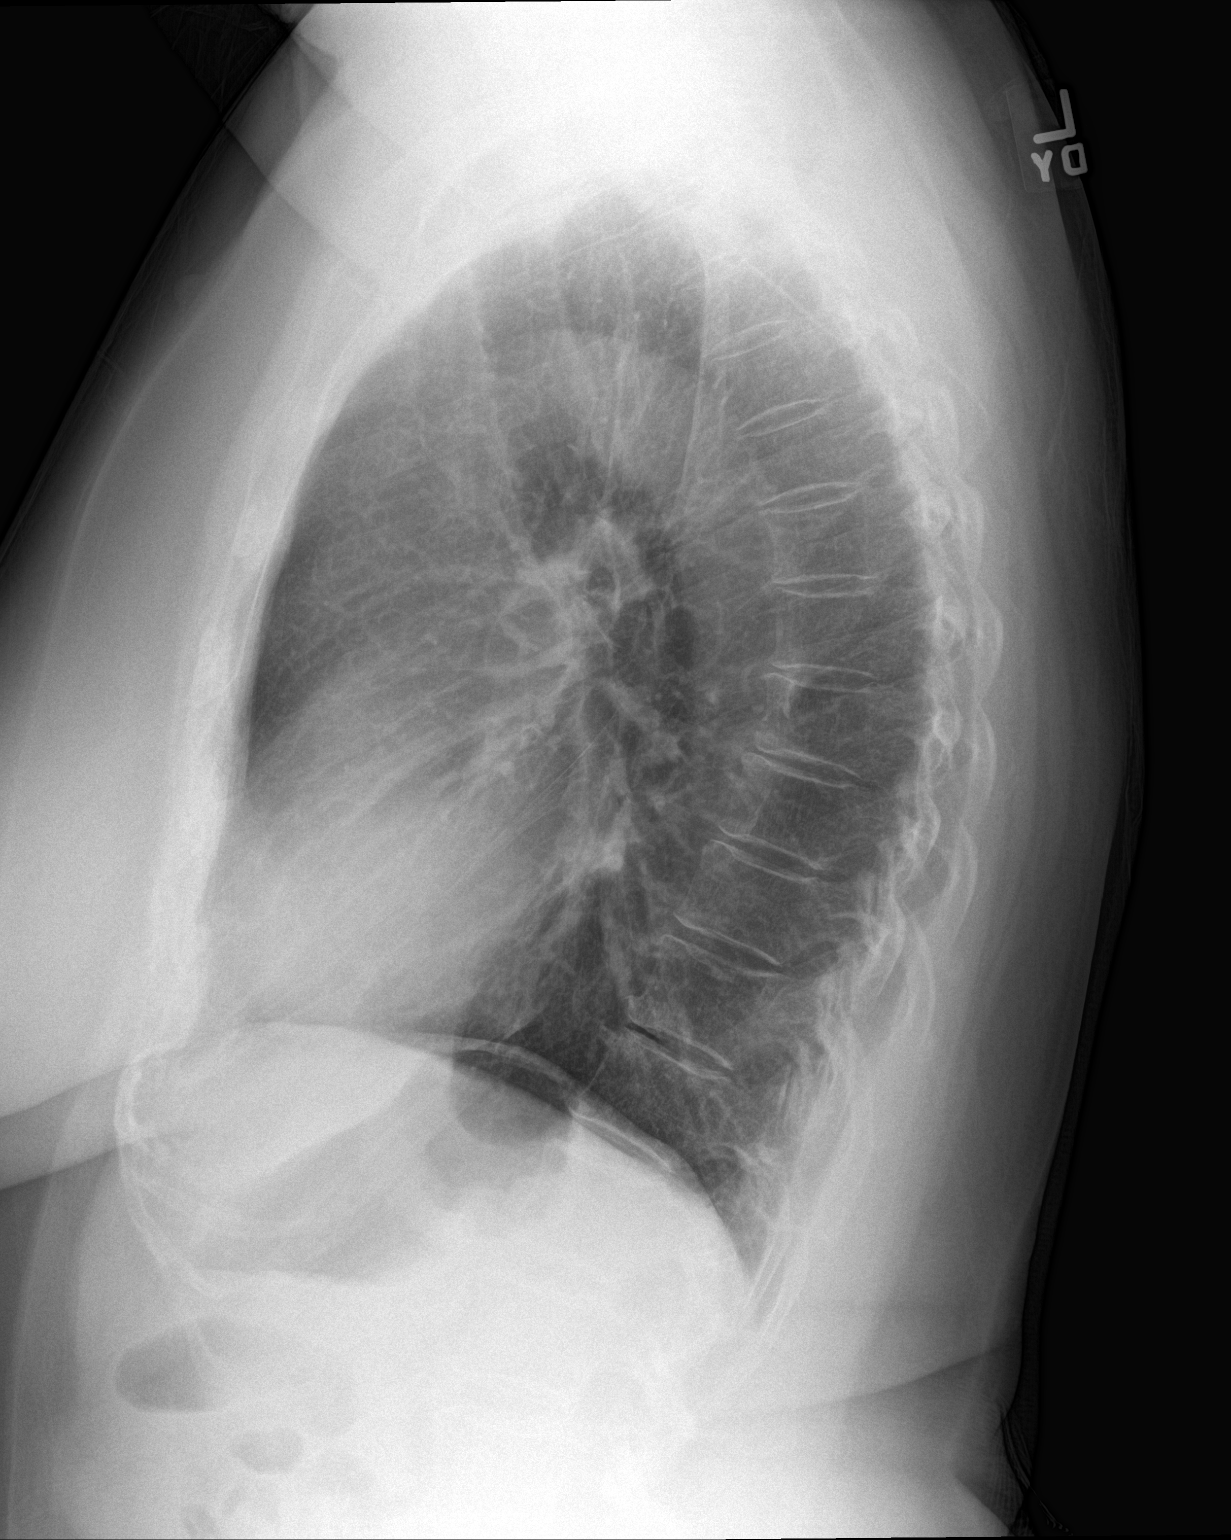

[2 of 2 positions shown; findings below may reference images not displayed]

FINDINGS: The cardiopericardial silhouette is mildly enlarged. There is no
airspace disease or pleural effusion. Bilateral pleural apical
thickening/ scarring. Mild thoracic spondylosis.
IMPRESSION: No acute cardiopulmonary disease.

## 2016-10-14 IMAGING — CT CT HEAD WITHOUT CONTRAST
1 series · 16 of 30 positions shown, 20 images · non-contrast
Comparison: None.

CLINICAL DATA: Syncope, altered mental status

EXAM:
CT HEAD WITHOUT CONTRAST
TECHNIQUE: Contiguous axial images were obtained from the base of the skull
through the vertex without intravenous contrast.

[Series 2: head wo · axial · 0.46mm/px · z∈[-158,-32]mm · 16 of 32 slices shown, 20 images]
[im 2/32  brain]
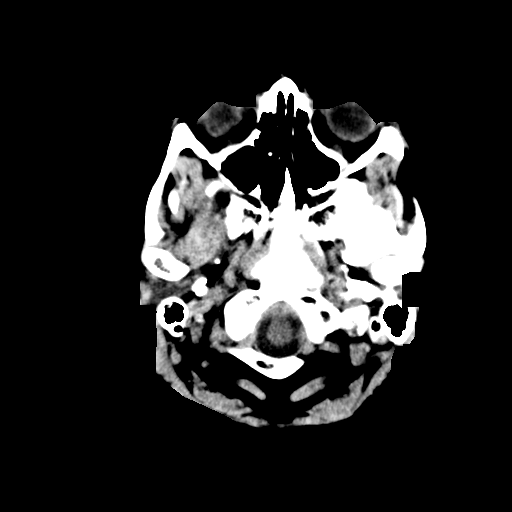
[im 2/32  bone]
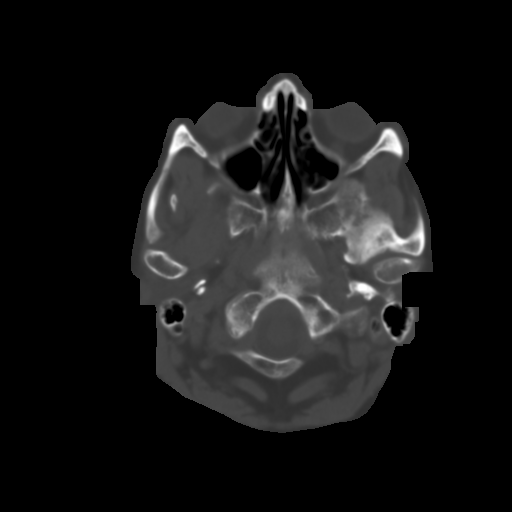
[im 4/32  brain]
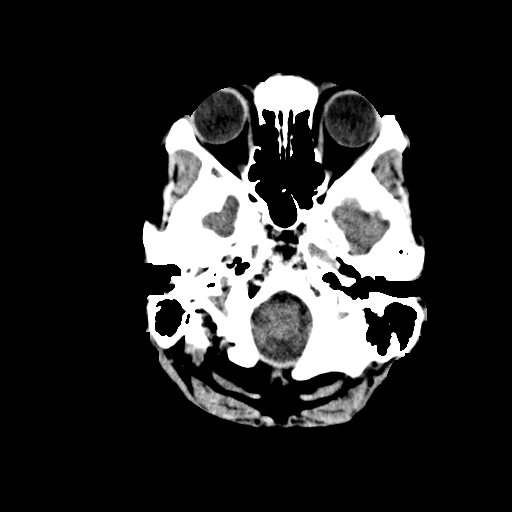
[im 6/32  brain]
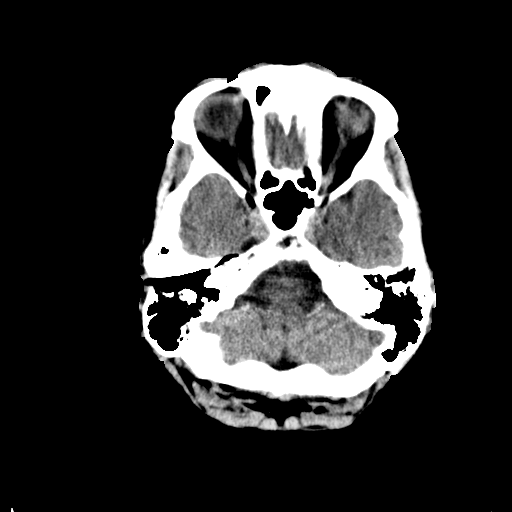
[im 8/32  brain]
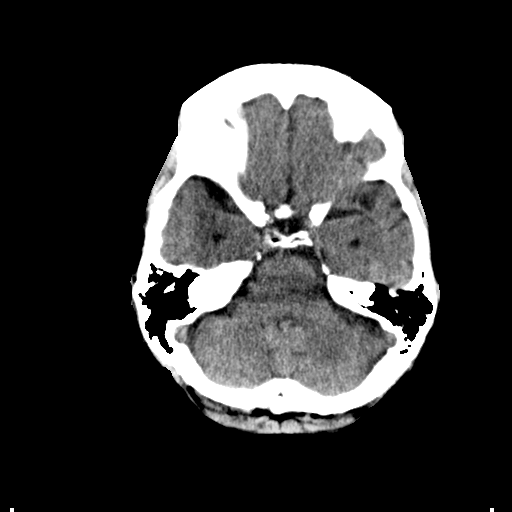
[im 9/32  brain]
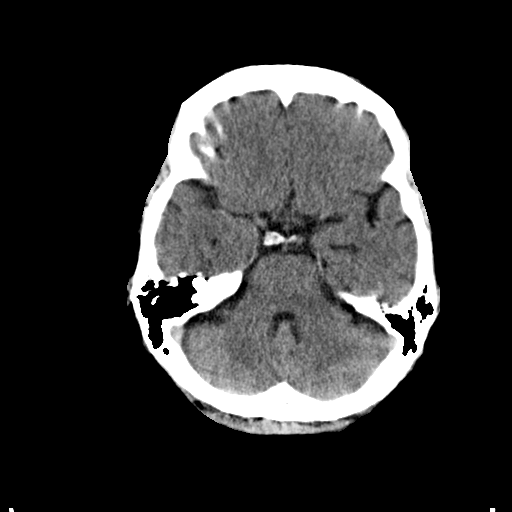
[im 9/32  bone]
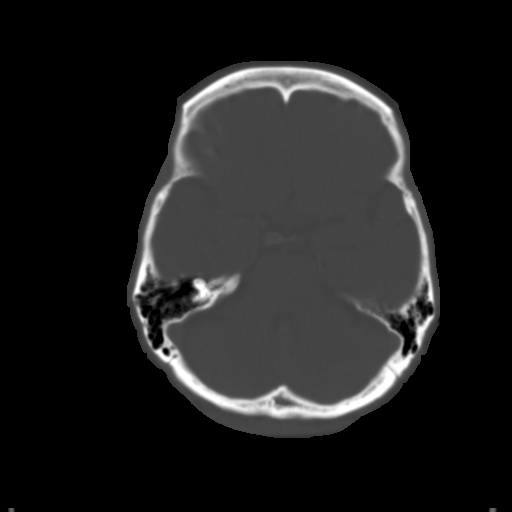
[im 11/32  brain]
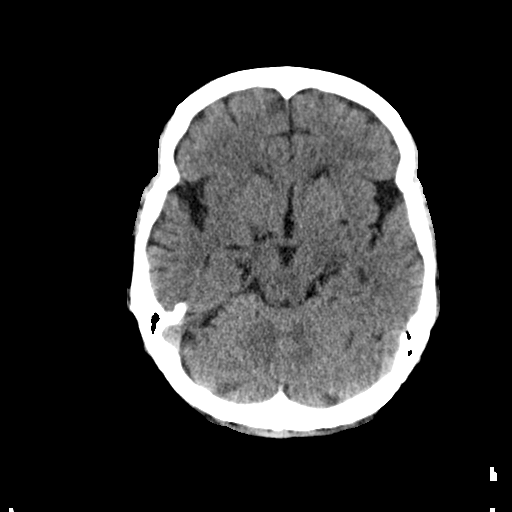
[im 13/32  brain]
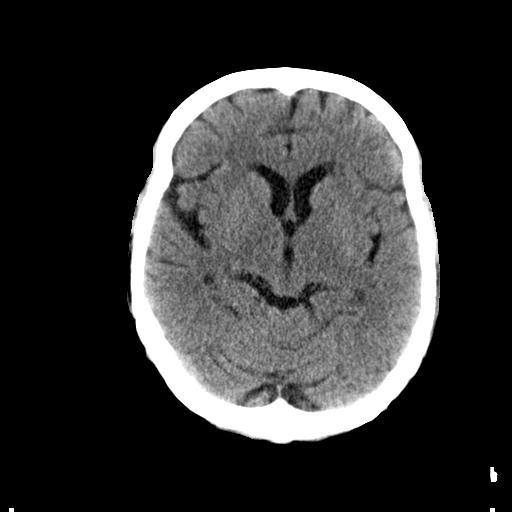
[im 15/32  brain]
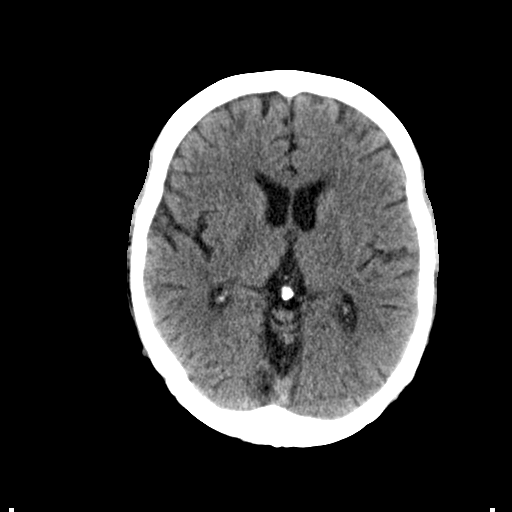
[im 17/32  brain]
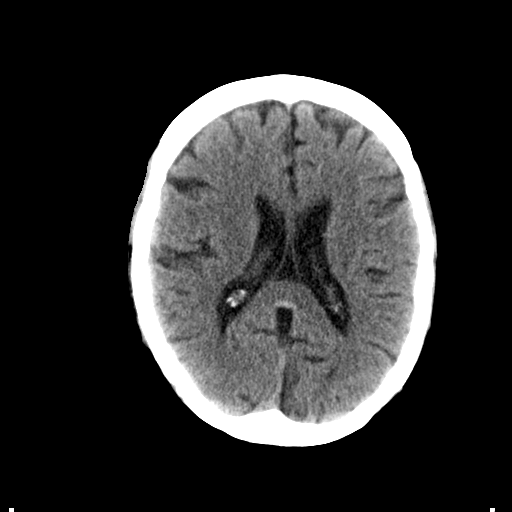
[im 17/32  bone]
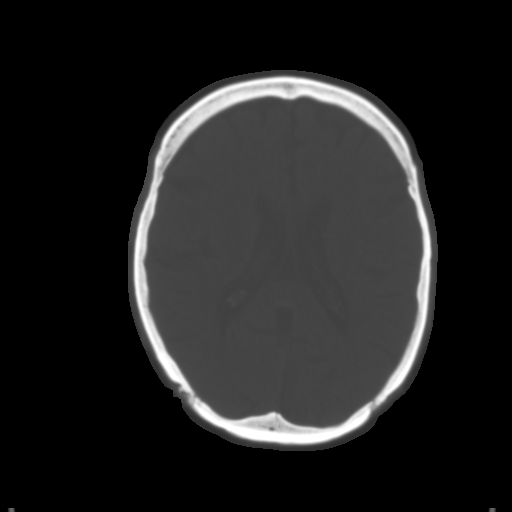
[im 19/32  brain]
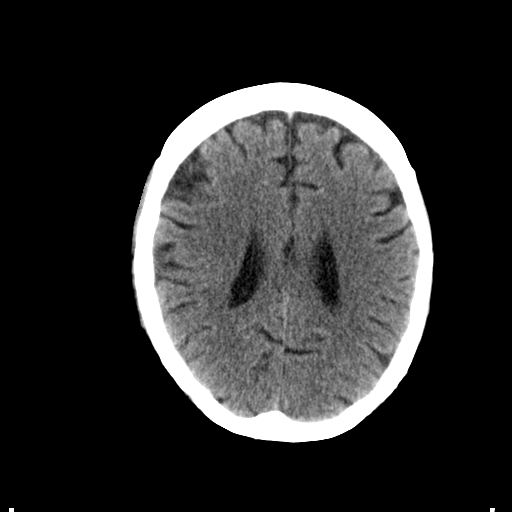
[im 21/32  brain]
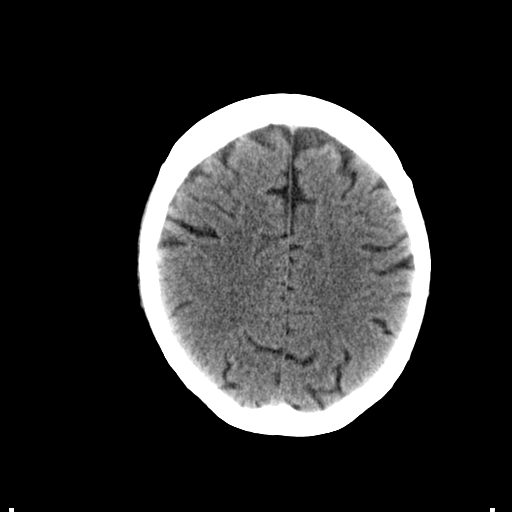
[im 23/32  brain]
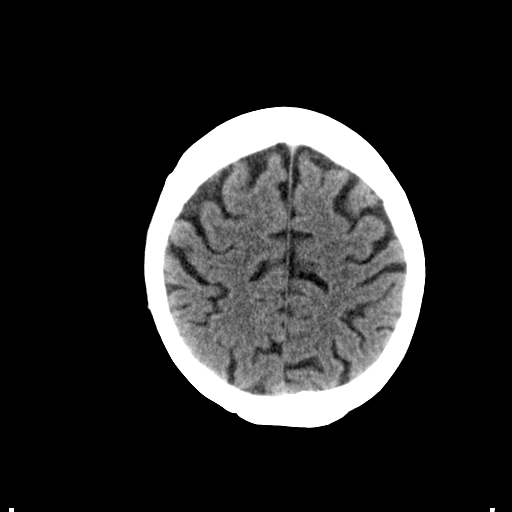
[im 24/32  brain]
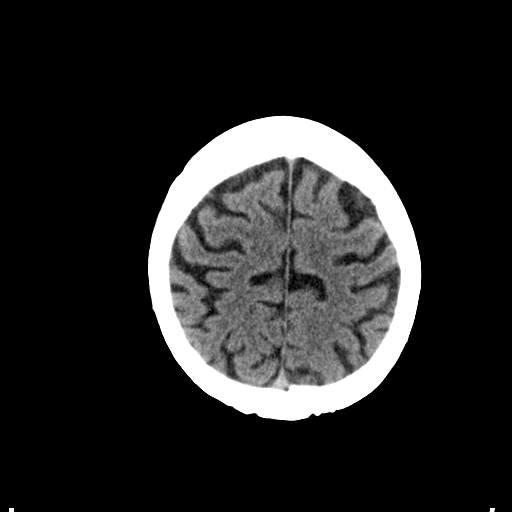
[im 24/32  bone]
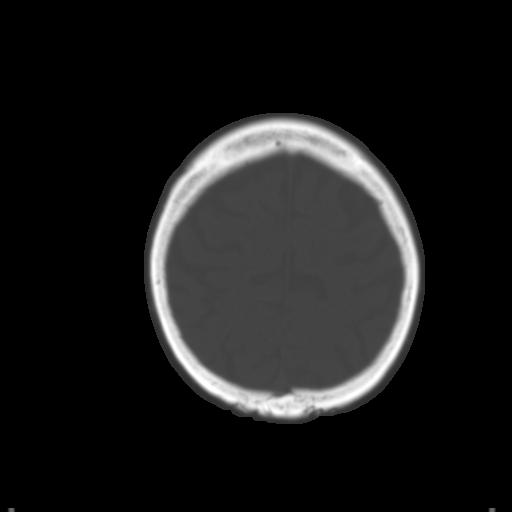
[im 26/32  brain]
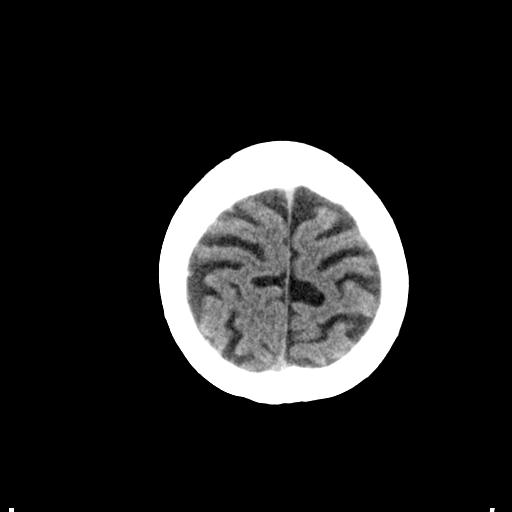
[im 28/32  brain]
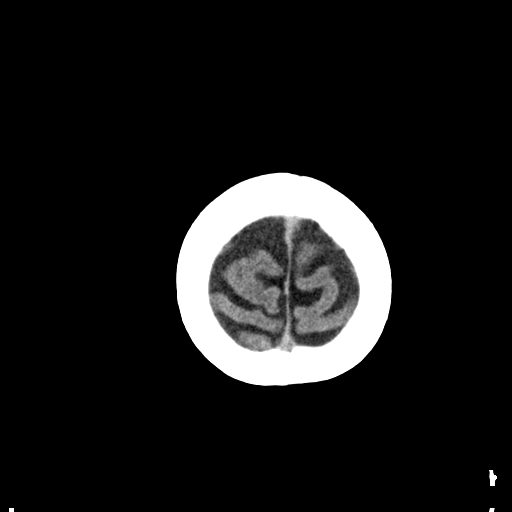
[im 30/32  brain]
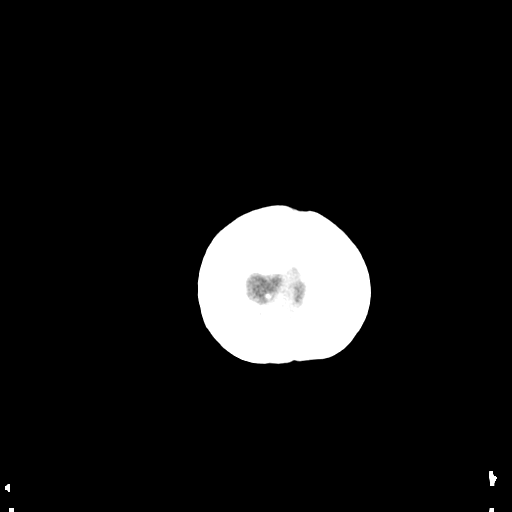

[16 of 30 positions shown; findings below may reference images not displayed]

FINDINGS: No skull fracture is noted. Paranasal sinuses and mastoid air cells
are unremarkable. No intracranial hemorrhage, mass effect or midline
shift. Mild cerebral atrophy. No acute infarction. Mild
periventricular white matter decreased attenuation probable due to
chronic small vessel ischemic changes. No mass lesion is noted on
this unenhanced scan.
IMPRESSION: No acute intracranial abnormality. Mild cerebral atrophy. Mild
periventricular white matter decreased attenuation probable due to
chronic small vessel ischemic changes.

## 2016-10-20 DIAGNOSIS — L820 Inflamed seborrheic keratosis: Secondary | ICD-10-CM | POA: Diagnosis not present

## 2016-10-20 DIAGNOSIS — L918 Other hypertrophic disorders of the skin: Secondary | ICD-10-CM | POA: Diagnosis not present

## 2016-10-20 DIAGNOSIS — D485 Neoplasm of uncertain behavior of skin: Secondary | ICD-10-CM | POA: Diagnosis not present

## 2016-10-20 DIAGNOSIS — L821 Other seborrheic keratosis: Secondary | ICD-10-CM | POA: Diagnosis not present

## 2016-10-20 DIAGNOSIS — Z1283 Encounter for screening for malignant neoplasm of skin: Secondary | ICD-10-CM | POA: Diagnosis not present

## 2016-10-20 DIAGNOSIS — L570 Actinic keratosis: Secondary | ICD-10-CM | POA: Diagnosis not present

## 2016-10-20 DIAGNOSIS — D229 Melanocytic nevi, unspecified: Secondary | ICD-10-CM | POA: Diagnosis not present

## 2016-10-20 DIAGNOSIS — C4441 Basal cell carcinoma of skin of scalp and neck: Secondary | ICD-10-CM | POA: Diagnosis not present

## 2016-10-20 DIAGNOSIS — C4491 Basal cell carcinoma of skin, unspecified: Secondary | ICD-10-CM

## 2016-10-20 DIAGNOSIS — Z85828 Personal history of other malignant neoplasm of skin: Secondary | ICD-10-CM | POA: Diagnosis not present

## 2016-10-20 DIAGNOSIS — L578 Other skin changes due to chronic exposure to nonionizing radiation: Secondary | ICD-10-CM | POA: Diagnosis not present

## 2016-10-20 HISTORY — DX: Basal cell carcinoma of skin, unspecified: C44.91

## 2016-11-12 ENCOUNTER — Other Ambulatory Visit: Payer: Self-pay | Admitting: Family Medicine

## 2016-12-04 HISTORY — PX: SQUAMOUS CELL CARCINOMA EXCISION: SHX2433

## 2016-12-04 HISTORY — PX: BASAL CELL CARCINOMA EXCISION: SHX1214

## 2016-12-06 DIAGNOSIS — C4441 Basal cell carcinoma of skin of scalp and neck: Secondary | ICD-10-CM | POA: Diagnosis not present

## 2016-12-18 ENCOUNTER — Telehealth: Payer: Self-pay | Admitting: Family Medicine

## 2016-12-18 DIAGNOSIS — E559 Vitamin D deficiency, unspecified: Secondary | ICD-10-CM

## 2016-12-18 DIAGNOSIS — Z0000 Encounter for general adult medical examination without abnormal findings: Secondary | ICD-10-CM

## 2016-12-18 NOTE — Telephone Encounter (Signed)
-----   Message from Eustace Pen, LPN sent at 8/42/1031  4:37 PM EDT ----- Regarding: Labs 4/19 Please place lab orders.   [employer]

## 2016-12-22 ENCOUNTER — Ambulatory Visit (INDEPENDENT_AMBULATORY_CARE_PROVIDER_SITE_OTHER): Payer: Medicare HMO

## 2016-12-22 VITALS — BP 122/82 | HR 56 | Temp 98.0°F | Ht 62.75 in | Wt 188.8 lb

## 2016-12-22 DIAGNOSIS — E559 Vitamin D deficiency, unspecified: Secondary | ICD-10-CM

## 2016-12-22 DIAGNOSIS — Z0000 Encounter for general adult medical examination without abnormal findings: Secondary | ICD-10-CM

## 2016-12-22 LAB — COMPREHENSIVE METABOLIC PANEL
ALT: 18 U/L (ref 0–35)
AST: 23 U/L (ref 0–37)
Albumin: 4.5 g/dL (ref 3.5–5.2)
Alkaline Phosphatase: 57 U/L (ref 39–117)
BUN: 20 mg/dL (ref 6–23)
CO2: 31 meq/L (ref 19–32)
CREATININE: 0.87 mg/dL (ref 0.40–1.20)
Calcium: 9.9 mg/dL (ref 8.4–10.5)
Chloride: 102 mEq/L (ref 96–112)
GFR: 67.44 mL/min (ref >60.00)
GLUCOSE: 96 mg/dL (ref 70–99)
Potassium: 4.1 mEq/L (ref 3.5–5.1)
SODIUM: 140 meq/L (ref 135–145)
Total Bilirubin: 0.6 mg/dL (ref 0.2–1.2)
Total Protein: 7.2 g/dL (ref 6.0–8.3)

## 2016-12-22 LAB — CBC WITH DIFFERENTIAL/PLATELET
Basophils Absolute: 0 K/uL (ref 0.0–0.1)
Basophils Relative: 0.4 % (ref 0.0–3.0)
Eosinophils Absolute: 0.1 K/uL (ref 0.0–0.7)
Eosinophils Relative: 2.1 % (ref 0.0–5.0)
HCT: 43.2 % (ref 36.0–46.0)
Hemoglobin: 14.5 g/dL (ref 12.0–15.0)
Lymphocytes Relative: 30.8 % (ref 12.0–46.0)
Lymphs Abs: 1.7 K/uL (ref 0.7–4.0)
MCHC: 33.5 g/dL (ref 30.0–36.0)
MCV: 91.6 fl (ref 78.0–100.0)
Monocytes Absolute: 0.5 K/uL (ref 0.1–1.0)
Monocytes Relative: 9.4 % (ref 3.0–12.0)
Neutro Abs: 3.1 K/uL (ref 1.4–7.7)
Neutrophils Relative %: 57.3 % (ref 43.0–77.0)
Platelets: 258 K/uL (ref 150.0–400.0)
RBC: 4.72 Mil/uL (ref 3.87–5.11)
RDW: 13.3 % (ref 11.5–15.5)
WBC: 5.5 K/uL (ref 4.0–10.5)

## 2016-12-22 LAB — LIPID PANEL
Cholesterol: 245 mg/dL — ABNORMAL HIGH (ref 0–200)
HDL: 68.8 mg/dL
LDL Cholesterol: 153 mg/dL — ABNORMAL HIGH (ref 0–99)
NonHDL: 176.37
Total CHOL/HDL Ratio: 4
Triglycerides: 118 mg/dL (ref 0.0–149.0)
VLDL: 23.6 mg/dL (ref 0.0–40.0)

## 2016-12-22 LAB — VITAMIN D 25 HYDROXY (VIT D DEFICIENCY, FRACTURES): VITD: 48.82 ng/mL (ref 30.00–100.00)

## 2016-12-22 LAB — TSH: TSH: 3.76 u[IU]/mL (ref 0.35–4.50)

## 2016-12-22 NOTE — Patient Instructions (Addendum)
Monica Guerrero , Thank you for taking time to come for your Medicare Wellness Visit. I appreciate your ongoing commitment to your health goals. Please review the following plan we discussed and let me know if I can assist you in the future.   These are the goals we discussed: Goals    . Increase physical activity          When schedule permits, I plan to resume going to gym for 60 min at least twice weekly.        This is a list of the screening recommended for you and due dates:  Health Maintenance  Topic Date Due  . Mammogram  12/15/2017*  . Flu Shot  04/05/2017  . Cologuard (Stool DNA test)  12/27/2018  . Tetanus Vaccine  08/08/2022  . DEXA scan (bone density measurement)  Completed  . Pneumonia vaccines  Completed  *Topic was postponed. The date shown is not the original due date.     Preventive Care for Adults  A healthy lifestyle and preventive care can promote health and wellness. Preventive health guidelines for adults include the following key practices.  . A routine yearly physical is a good way to check with your health care provider about your health and preventive screening. It is a chance to share any concerns and updates on your health and to receive a thorough exam.  . Visit your dentist for a routine exam and preventive care every 6 months. Brush your teeth twice a day and floss once a day. Good oral hygiene prevents tooth decay and gum disease.  . The frequency of eye exams is based on your age, health, family medical history, use  of contact lenses, and other factors. Follow your health care provider's ecommendations for frequency of eye exams.  . Eat a healthy diet. Foods like vegetables, fruits, whole grains, low-fat dairy products, and lean protein foods contain the nutrients you need without too many calories. Decrease your intake of foods high in solid fats, added sugars, and salt. Eat the right amount of calories for you. Get information about a proper diet  from your health care provider, if necessary.  . Regular physical exercise is one of the most important things you can do for your health. Most adults should get at least 150 minutes of moderate-intensity exercise (any activity that increases your heart rate and causes you to sweat) each week. In addition, most adults need muscle-strengthening exercises on 2 or more days a week.  Silver Sneakers may be a benefit available to you. To determine eligibility, you may visit the website: www.silversneakers.com or contact program at (251) 157-2021 Mon-Fri between 8AM-8PM.   . Maintain a healthy weight. The body mass index (BMI) is a screening tool to identify possible weight problems. It provides an estimate of body fat based on height and weight. Your health care provider can find your BMI and can help you achieve or maintain a healthy weight.   For adults 20 years and older: ? A BMI below 18.5 is considered underweight. ? A BMI of 18.5 to 24.9 is normal. ? A BMI of 25 to 29.9 is considered overweight. ? A BMI of 30 and above is considered obese.   . Maintain normal blood lipids and cholesterol levels by exercising and minimizing your intake of saturated fat. Eat a balanced diet with plenty of fruit and vegetables. Blood tests for lipids and cholesterol should begin at age 60 and be repeated every 5 years. If your lipid or  cholesterol levels are high, you are over 50, or you are at high risk for heart disease, you may need your cholesterol levels checked more frequently. Ongoing high lipid and cholesterol levels should be treated with medicines if diet and exercise are not working.  . If you smoke, find out from your health care provider how to quit. If you do not use tobacco, please do not start.  . If you choose to drink alcohol, please do not consume more than 2 drinks per day. One drink is considered to be 12 ounces (355 mL) of beer, 5 ounces (148 mL) of wine, or 1.5 ounces (44 mL) of liquor.  .  If you are 2-14 years old, ask your health care provider if you should take aspirin to prevent strokes.  . Use sunscreen. Apply sunscreen liberally and repeatedly throughout the day. You should seek shade when your shadow is shorter than you. Protect yourself by wearing long sleeves, pants, a wide-brimmed hat, and sunglasses year round, whenever you are outdoors.  . Once a month, do a whole body skin exam, using a mirror to look at the skin on your back. Tell your health care provider of new moles, moles that have irregular borders, moles that are larger than a pencil eraser, or moles that have changed in shape or color.

## 2016-12-23 NOTE — Progress Notes (Signed)
Medical screening examination/treatment/procedure(s) were performed by registered nurse and as supervising non-physician practitioner, I was immediately available for consultation/collaboration.   BAITY, REGINA, NP  

## 2016-12-23 NOTE — Progress Notes (Signed)
Subjective:   Monica Guerrero is a 76 y.o. female who presents for Medicare Annual (Subsequent) preventive examination.  Review of Systems:  N/A Cardiac Risk Factors include: advanced age (>63men, >43 women);dyslipidemia;hypertension;obesity (BMI >30kg/m2)     Objective:     Vitals: BP 122/82 (BP Location: Right Arm, Patient Position: Sitting, Cuff Size: Normal)   Pulse (!) 56   Temp 98 F (36.7 C) (Oral)   Ht 5' 2.75" (1.594 m) Comment: no shoes  Wt 188 lb 12 oz (85.6 kg)   SpO2 97%   BMI 33.70 kg/m   Body mass index is 33.7 kg/m.   Tobacco History  Smoking Status  . Never Smoker  Smokeless Tobacco  . Never Used     Counseling given: No   Past Medical History:  Diagnosis Date  . Allergy   . Arthritis    Osteoarthritis  . Hypertension   . Osteoporosis    Past Surgical History:  Procedure Laterality Date  . BASAL CELL CARCINOMA EXCISION  12/2016   neck  . EYE SURGERY     Bilateral cataract Surgery  . SQUAMOUS CELL CARCINOMA EXCISION  12/2016   neck  . TONSILLECTOMY     Family History  Problem Relation Age of Onset  . Cancer Father     Kidney  . Heart disease Father   . Cancer Paternal Grandmother   . Dementia Mother   . Hypertension Brother   . Heart disease Maternal Uncle   . Stroke Maternal Grandmother   . Heart disease Maternal Grandfather    History  Sexual Activity  . Sexual activity: Not on file    Outpatient Encounter Prescriptions as of 12/22/2016  Medication Sig  . acetaminophen (TYLENOL ARTHRITIS PAIN) 650 MG CR tablet Take 650 mg by mouth every 8 (eight) hours as needed. Alternates with Ibuprofen.   Marland Kitchen aspirin 81 MG tablet Take 81 mg by mouth daily.    . bisoprolol-hydrochlorothiazide (ZIAC) 2.5-6.25 MG tablet TAKE 1 TABLET EVERY DAY  . calcium carbonate (OS-CAL) 600 MG TABS Take 600 mg by mouth 2 (two) times daily with a meal.    . chlorpheniramine (CHLOR-TRIMETON) 4 MG tablet Take 4 mg by mouth 2 (two) times daily as needed.    .  cholecalciferol (VITAMIN D) 1000 UNITS tablet Take 2,000 Units by mouth daily.  Marland Kitchen GARLIC PO Take 1 capsule by mouth daily.  Marland Kitchen ibuprofen (ADVIL,MOTRIN) 200 MG tablet Take 200 mg by mouth every 6 (six) hours as needed. Alternates with Tylenol   . loratadine (CLARITIN) 10 MG tablet Take 10 mg by mouth as directed.    . naproxen sodium (ANAPROX) 220 MG tablet Take 220 mg by mouth 2 (two) times daily.  . phenylephrine (NEO-SYNEPHRINE) 0.125 % nasal drops Place 1 drop into the nose as needed.  Vladimir Faster Glycol-Propyl Glycol (SYSTANE OP) Apply to eye 2 (two) times daily as needed.  . timolol (BETIMOL) 0.5 % ophthalmic solution Place 1 drop into both eyes daily.   . verapamil (CALAN-SR) 240 MG CR tablet TAKE 1 TABLET AT BEDTIME  . [DISCONTINUED] Magnesium 250 MG TABS Take 1 tablet by mouth daily.   No facility-administered encounter medications on file as of 12/22/2016.     Activities of Daily Living In your present state of health, do you have any difficulty performing the following activities: 12/22/2016  Hearing? N  Vision? N  Difficulty concentrating or making decisions? N  Walking or climbing stairs? N  Dressing or bathing? N  Doing  errands, shopping? N  Preparing Food and eating ? N  Using the Toilet? N  In the past six months, have you accidently leaked urine? N  Do you have problems with loss of bowel control? N  Managing your Medications? N  Managing your Finances? N  Housekeeping or managing your Housekeeping? N  Some recent data might be hidden    Patient Care Team: Abner Greenspan, MD as PCP - General Leandrew Koyanagi, MD as Referring Physician (Ophthalmology)    Assessment:     Hearing Screening   125Hz  250Hz  500Hz  1000Hz  2000Hz  3000Hz  4000Hz  6000Hz  8000Hz   Right ear:   40 40 40  0    Left ear:   40 40 40  0    Vision Screening Comments: Last vision exam in Nov 2017 with Dr. Wallace Going   Exercise Activities and Dietary recommendations Current Exercise Habits: The  patient does not participate in regular exercise at present, Exercise limited by: None identified  Goals    . Increase physical activity          When schedule permits, I plan to resume going to gym for 60 min at least twice weekly.       Fall Risk Fall Risk  12/22/2016 12/08/2015 10/31/2014 09/13/2013 08/08/2012  Falls in the past year? No Yes Yes No No  Number falls in past yr: - 1 2 or more - -  Injury with Fall? - No Yes - -   Depression Screen PHQ 2/9 Scores 12/22/2016 12/08/2015 10/31/2014 09/13/2013  PHQ - 2 Score 0 0 0 0     Cognitive Function MMSE - Mini Mental State Exam 12/22/2016  Orientation to time 5  Orientation to Place 5  Registration 3  Attention/ Calculation 0  Recall 3  Language- name 2 objects 0  Language- repeat 1  Language- follow 3 step command 3  Language- read & follow direction 0  Write a sentence 0  Copy design 0  Total score 20     PLEASE NOTE: A Mini-Cog screen was completed. Maximum score is 20. A value of 0 denotes this part of Folstein MMSE was not completed or the patient failed this part of the Mini-Cog screening.   Mini-Cog Screening Orientation to Time - Max 5 pts Orientation to Place - Max 5 pts Registration - Max 3 pts Recall - Max 3 pts Language Repeat - Max 1 pts Language Follow 3 Step Command - Max 3 pts     Immunization History  Administered Date(s) Administered  . Influenza Split 06/14/2012  . Influenza Whole 06/22/2007, 06/02/2008, 06/10/2009, 06/09/2010  . Influenza,inj,Quad PF,36+ Mos 05/30/2013, 06/16/2014, 06/19/2015, 06/07/2016  . PPD Test 09/13/2013, 11/05/2013  . Pneumococcal Conjugate-13 10/31/2014  . Pneumococcal Polysaccharide-23 04/19/2011  . Td 09/05/2002, 08/08/2012   Screening Tests Health Maintenance  Topic Date Due  . MAMMOGRAM  12/15/2017 (Originally 12/16/2016)  . INFLUENZA VACCINE  04/05/2017  . Fecal DNA (Cologuard)  12/27/2018  . TETANUS/TDAP  08/08/2022  . DEXA SCAN  Completed  . PNA vac Low Risk  Adult  Completed      Plan:     I have personally reviewed and addressed the Medicare Annual Wellness questionnaire and have noted the following in the patient's chart:  A. Medical and social history B. Use of alcohol, tobacco or illicit drugs  C. Current medications and supplements D. Functional ability and status E.  Nutritional status F.  Physical activity G. Advance directives H. List of other physicians I.  Hospitalizations,  surgeries, and ER visits in previous 12 months J.  [place] to include hearing, vision, cognitive, depression L. Referrals and appointments - none  In addition, I have reviewed and discussed with patient certain preventive protocols, quality metrics, and best practice recommendations. A written personalized care plan for preventive services as well as general preventive health recommendations were provided to patient.  See attached scanned questionnaire for additional information.   Signed,   Lindell Noe, MHA, BS, LPN Health Coach

## 2016-12-23 NOTE — Progress Notes (Signed)
Pre visit review using our clinic review tool, if applicable. No additional management support is needed unless otherwise documented below in the visit note. 

## 2016-12-23 NOTE — Progress Notes (Signed)
PCP notes:   Health maintenance:  Mammogram - pt will complete at future date  Abnormal screenings:   Hearing - failed  Patient concerns:   None  Nurse concerns:  None  Next PCP appt:   12/27/16 @ 1130

## 2016-12-27 ENCOUNTER — Ambulatory Visit (INDEPENDENT_AMBULATORY_CARE_PROVIDER_SITE_OTHER): Payer: Medicare HMO | Admitting: Family Medicine

## 2016-12-27 ENCOUNTER — Encounter: Payer: Self-pay | Admitting: Family Medicine

## 2016-12-27 VITALS — BP 118/74 | HR 62 | Temp 98.2°F | Ht 62.75 in | Wt 188.8 lb

## 2016-12-27 DIAGNOSIS — Z0000 Encounter for general adult medical examination without abnormal findings: Secondary | ICD-10-CM

## 2016-12-27 DIAGNOSIS — M810 Age-related osteoporosis without current pathological fracture: Secondary | ICD-10-CM

## 2016-12-27 DIAGNOSIS — I10 Essential (primary) hypertension: Secondary | ICD-10-CM

## 2016-12-27 DIAGNOSIS — E7800 Pure hypercholesterolemia, unspecified: Secondary | ICD-10-CM | POA: Diagnosis not present

## 2016-12-27 DIAGNOSIS — E559 Vitamin D deficiency, unspecified: Secondary | ICD-10-CM

## 2016-12-27 DIAGNOSIS — E669 Obesity, unspecified: Secondary | ICD-10-CM | POA: Diagnosis not present

## 2016-12-27 MED ORDER — VERAPAMIL HCL ER 240 MG PO TBCR: 240.0000 mg | EXTENDED_RELEASE_TABLET | Freq: Every day | ORAL | 3 refills | Status: DC

## 2016-12-27 MED ORDER — BISOPROLOL-HYDROCHLOROTHIAZIDE 2.5-6.25 MG PO TABS: 1.0000 | ORAL_TABLET | Freq: Every day | ORAL | 3 refills | Status: DC

## 2016-12-27 NOTE — Patient Instructions (Addendum)
Don't forget to schedule your mammogram   The new shingles vaccine is called Shingrix  If you are interested in a shingles/zoster vaccine - call your insurance to check on coverage,( you should not get it within 1 month of other vaccines) , then call us for a prescription  for it to take to a pharmacy that gives the shot , or make a nurse visit to get it here depending on your coverage   Avoid red meat/ fried foods/ egg yolks/ fatty breakfast meats/ butter, cheese and high fat dairy/ and shellfish    Take care of yourself   Think about seeing a podiatrist about the fungal toe nails

## 2016-12-27 NOTE — Progress Notes (Signed)
Subjective:    Patient ID: Monica Guerrero, female    DOB: Jan 04, 1941, 76 y.o.   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 - she notices problems mainly in a crowd  No desire for a hearing aide yet  No falls this year - being very careful   Mammogram 4/17 neg- does not have that scheduled yet- goes to [place]  Self breast exam -no lumps   cologuard 4/17 neg  dexa 3/16- osteoporosis mixed trend Overall stable  No fractures  No falls  Vit D level 48.8 Her calcium pill gags her so she started taking the gummies  Was on evista for 5 y  She wants to wait another year for next dexa   Zoster vaccination - has had the zostavax May consdider the new one   bp is stable today  No cp or palpitations or headaches or edema  No side effects to medicines  BP Readings from Last 3 Encounters:  12/27/16 118/74  12/22/16 122/82  12/08/15 124/66     Hx of hyperlipidemia Lab Results  Component Value Date   CHOL 245 (H) 12/22/2016   CHOL 228 (H) 12/01/2015   CHOL 248 (H) 10/24/2014   Lab Results  Component Value Date   HDL 68.80 12/22/2016   HDL 63.30 12/01/2015   HDL 63.60 10/24/2014   Lab Results  Component Value Date   LDLCALC 153 (H) 12/22/2016   LDLCALC 150 (H) 12/01/2015   LDLCALC 165 (H) 10/24/2014   Lab Results  Component Value Date   TRIG 118.0 12/22/2016   TRIG 70.0 12/01/2015   TRIG 99.0 10/24/2014   Lab Results  Component Value Date   CHOLHDL 4 12/22/2016   CHOLHDL 4  12/01/2015   CHOLHDL 4 10/24/2014   Lab Results  Component Value Date   LDLDIRECT 175.7 09/09/2013   LDLDIRECT 184.5 08/01/2012   LDLDIRECT 169.6 10/20/2011  diet is fair  Cheese is her weakness  Declines treatment of any kind  Will work on diet   Results for orders placed or performed in visit on 12/22/16  CBC with Differential/Platelet  Result Value Ref Range   WBC 5.5 4.0 - 10.5 K/uL   RBC 4.72 3.87 - 5.11 Mil/uL   Hemoglobin 14.5 12.0 - 15.0 g/dL   HCT 43.2 36.0 - 46.0 %   MCV 91.6 78.0 - 100.0 fl   MCHC 33.5 30.0 - 36.0 g/dL   RDW 13.3 11.5 - 15.5 %   Platelets 258.0 150.0 - 400.0 K/uL   Neutrophils Relative % 57.3 43.0 - 77.0 %   Lymphocytes Relative 30.8 12.0 - 46.0 %   Monocytes Relative 9.4 3.0 - 12.0 %   Eosinophils Relative 2.1 0.0 - 5.0 %   Basophils Relative 0.4 0.0 - 3.0 %   Neutro Abs 3.1 1.4 - 7.7 K/uL   Lymphs Abs 1.7 0.7 - 4.0 K/uL  Monocytes Absolute 0.5 0.1 - 1.0 K/uL   Eosinophils Absolute 0.1 0.0 - 0.7 K/uL   Basophils Absolute 0.0 0.0 - 0.1 K/uL  Comprehensive metabolic panel  Result Value Ref Range   Sodium 140 135 - 145 mEq/L   Potassium 4.1 3.5 - 5.1 mEq/L   Chloride 102 96 - 112 mEq/L   CO2 31 19 - 32 mEq/L   Glucose, Bld 96 70 - 99 mg/dL   BUN 20 6 - 23 mg/dL   Creatinine, Ser 0.87 0.40 - 1.20 mg/dL   Total Bilirubin 0.6 0.2 - 1.2 mg/dL   Alkaline Phosphatase 57 39 - 117 U/L   AST 23 0 - 37 U/L   ALT 18 0 - 35 U/L   Total Protein 7.2 6.0 - 8.3 g/dL   Albumin 4.5 3.5 - 5.2 g/dL   Calcium 9.9 8.4 - 10.5 mg/dL   GFR 67.44 >60.00 mL/min  Lipid panel  Result Value Ref Range   Cholesterol 245 (H) 0 - 200 mg/dL   Triglycerides 118.0 0.0 - 149.0 mg/dL   HDL 68.80 >39.00 mg/dL   VLDL 23.6 0.0 - 40.0 mg/dL   LDL Cholesterol 153 (H) 0 - 99 mg/dL   Total CHOL/HDL Ratio 4    NonHDL 176.37   TSH  Result Value Ref Range   TSH 3.76 0.35 - 4.50 uIU/mL  VITAMIN D 25 Hydroxy (Vit-D Deficiency, Fractures)  Result Value Ref Range   VITD 48.82  30.00 - 100.00 ng/mL     Patient Active Problem List   Diagnosis Date Noted  . Routine general medical examination at a health care facility 12/08/2015  . GERD (gastroesophageal reflux disease) 12/08/2015  . 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 Estrogen deficiency 10/31/2014  . Encounter for screening mammogram for breast cancer 10/31/2014  . Vitamin D deficiency 10/31/2014  . Shoulder pain, right 10/22/2014  . Skin cancer screening 09/23/2013  . Encounter for Medicare annual wellness exam 09/13/2013  . Other screening mammogram 04/19/2011  . IRRITABLE BOWEL SYNDROME 01/22/2010  . HYPERCHOLESTEROLEMIA 10/03/2007  . GLAUCOMA 10/03/2007  . Essential hypertension 10/03/2007  . ALLERGIC RHINITIS 10/03/2007  . OSTEOARTHRITIS 10/03/2007  . Osteoporosis 10/03/2007   Past Medical History:  Diagnosis Date  . Allergy   . Arthritis    Osteoarthritis  . Hypertension   . Osteoporosis    Past Surgical History:  Procedure Laterality Date  . BASAL CELL CARCINOMA EXCISION  12/2016   neck  . EYE SURGERY     Bilateral cataract Surgery  . SQUAMOUS CELL CARCINOMA EXCISION  12/2016   neck  . TONSILLECTOMY     Social History  Substance Use Topics  . Smoking status: Never Smoker  . Smokeless tobacco: Never Used  . Alcohol use No   Family History  Problem Relation Age of Onset  . Cancer Father     Kidney  . Heart disease Father   . Cancer Paternal Grandmother   . Dementia Mother   . Hypertension Brother   . Heart disease Maternal Uncle   . Stroke Maternal Grandmother   . Heart disease Maternal Grandfather    No Known Allergies Current Outpatient Prescriptions on File Prior to Visit  Medication Sig Dispense Refill  . acetaminophen (TYLENOL ARTHRITIS PAIN) 650 MG CR tablet Take 650 mg by mouth every 8 (eight) hours as needed. Alternates with Ibuprofen.     Marland Kitchen aspirin 81 MG tablet Take 81 mg by mouth daily.      . calcium carbonate (OS-CAL) 600  MG TABS Take 600 mg by mouth 2  (two) times daily with a meal.      . chlorpheniramine (CHLOR-TRIMETON) 4 MG tablet Take 4 mg by mouth 2 (two) times daily as needed.      . cholecalciferol (VITAMIN D) 1000 UNITS tablet Take 2,000 Units by mouth daily.    Marland Kitchen GARLIC PO Take 1 capsule by mouth daily.    Marland Kitchen ibuprofen (ADVIL,MOTRIN) 200 MG tablet Take 200 mg by mouth every 6 (six) hours as needed. Alternates with Tylenol     . loratadine (CLARITIN) 10 MG tablet Take 10 mg by mouth as directed.      . naproxen sodium (ANAPROX) 220 MG tablet Take 220 mg by mouth 2 (two) times daily.    . phenylephrine (NEO-SYNEPHRINE) 0.125 % nasal drops Place 1 drop into the nose as needed.    Vladimir Faster Glycol-Propyl Glycol (SYSTANE OP) Apply to eye 2 (two) times daily as needed.    . timolol (BETIMOL) 0.5 % ophthalmic solution Place 1 drop into both eyes daily.      No current facility-administered medications on file prior to visit.     Review of Systems Review of Systems  Constitutional: Negative for fever, appetite change, fatigue and unexpected weight change.  Eyes: Negative for pain and visual disturbance.  Respiratory: Negative for cough and shortness of breath.   Cardiovascular: Negative for cp or palpitations    Gastrointestinal: Negative for nausea, diarrhea and constipation.  Genitourinary: Negative for urgency and frequency.  Skin: Negative for pallor or rash  pos for thickened toenails/ suspect fungal  Neurological: Negative for weakness, light-headedness, numbness and headaches.  Hematological: Negative for adenopathy. Does not bruise/bleed easily.  Psychiatric/Behavioral: Negative for dysphoric mood. The patient is not nervous/anxious.         Objective:   Physical Exam  Constitutional: She appears well-developed and well-nourished. No distress.  obese and well appearing   HENT:  Head: Normocephalic and atraumatic.  Right Ear: External ear normal.  Left Ear: External ear normal.  Mouth/Throat: Oropharynx is clear and  moist.  Eyes: Conjunctivae and EOM are normal. Pupils are equal, round, and reactive to light. No scleral icterus.  Neck: Normal range of motion. Neck supple. No JVD present. Carotid bruit is not present. No thyromegaly present.  Cardiovascular: Normal rate, regular rhythm, normal heart sounds and intact distal pulses.  Exam reveals no gallop.   Pulmonary/Chest: Effort normal and breath sounds normal. No respiratory distress. She has no wheezes. She exhibits no tenderness.  Abdominal: Soft. Bowel sounds are normal. She exhibits no distension, no abdominal bruit and no mass. There is no tenderness.  Genitourinary: No breast swelling, tenderness, discharge or bleeding.  Genitourinary Comments: Breast exam: No mass, nodules, thickening, tenderness, bulging, retraction, inflamation, nipple discharge or skin changes noted.  No axillary or clavicular LA.      Musculoskeletal: Normal range of motion. She exhibits no edema or tenderness.  No kyphosis   Lymphadenopathy:    She has no cervical adenopathy.  Neurological: She is alert. She has normal reflexes. No cranial nerve deficit. She exhibits normal muscle tone. Coordination normal.  Skin: Skin is warm and dry. No rash noted. No erythema. No pallor.  Thickened and discolored toe nails   Lentigines diffusely  Psychiatric: She has a normal mood and affect.          Assessment & Plan:   Problem List Items Addressed This Visit      Cardiovascular and Mediastinum  Essential hypertension - Primary    bp in fair control at this time  BP Readings from Last 1 Encounters:  12/27/16 118/74   No changes needed Disc lifstyle change with low sodium diet and exercise  Labs reviewed        Relevant Medications   bisoprolol-hydrochlorothiazide (ZIAC) 2.5-6.25 MG tablet   verapamil (CALAN-SR) 240 MG CR tablet     Musculoskeletal and Integument   Osteoporosis    Rev dexa 3/16 Has had 5 y of evista No falls or fx  Clarified vit D dosing She  wants to wait another year on next dexa  Disc need for calcium/ vitamin D/ wt bearing exercise and bone density test every 2 y to monitor Disc safety/ fracture risk in detail           Other   HYPERCHOLESTEROLEMIA    Disc goals for lipids and reasons to control them Rev labs with pt Rev low sat fat diet in detail She declines treatment  LDL is in 150s Handout given re diet       Relevant Medications   bisoprolol-hydrochlorothiazide (ZIAC) 2.5-6.25 MG tablet   verapamil (CALAN-SR) 240 MG CR tablet   Obesity (BMI 30-39.9)    Discussed how this problem influences overall health and the risks it imposes  Reviewed plan for weight loss with lower calorie diet (via better food choices and also portion control or program like weight watchers) and exercise building up to or more than 30 minutes 5 days per week including some aerobic activity         Routine general medical examination at a health care facility    Reviewed health habits including diet and exercise and skin cancer prevention Reviewed appropriate screening tests for age  Also reviewed health mt list, fam hx and immunization status , as well as social and family history   See HPI Labs rev AMW reviewed  Pt wants to put off dexa another year  Not intereste in hearing aides yet She will schedule her own mammogram  Will look into coverage for zoster vaccine  She plans to see podiatry re: toe nail care       Vitamin D deficiency    Vitamin D level is therapeutic with current supplementation Disc importance of this to bone and overall health Level 48

## 2016-12-27 NOTE — Progress Notes (Signed)
Pre visit review using our clinic review tool, if applicable. No additional management support is needed unless otherwise documented below in the visit note. 

## 2016-12-27 NOTE — Assessment & Plan Note (Signed)
Discussed how this problem influences overall health and the risks it imposes  Reviewed plan for weight loss with lower calorie diet (via better food choices and also portion control or program like weight watchers) and exercise building up to or more than 30 minutes 5 days per week including some aerobic activity    

## 2016-12-27 NOTE — Assessment & Plan Note (Signed)
Vitamin D level is therapeutic with current supplementation Disc importance of this to bone and overall health Level 48

## 2016-12-27 NOTE — Assessment & Plan Note (Signed)
bp in fair control at this time  BP Readings from Last 1 Encounters:  12/27/16 118/74   No changes needed Disc lifstyle change with low sodium diet and exercise  Labs reviewed

## 2016-12-27 NOTE — Assessment & Plan Note (Signed)
Disc goals for lipids and reasons to control them Rev labs with pt Rev low sat fat diet in detail She declines treatment  LDL is in 150s Handout given re diet

## 2016-12-27 NOTE — Assessment & Plan Note (Signed)
Reviewed health habits including diet and exercise and skin cancer prevention Reviewed appropriate screening tests for age  Also reviewed health mt list, fam hx and immunization status , as well as social and family history   See HPI Labs rev AMW reviewed  Pt wants to put off dexa another year  Not intereste in hearing aides yet She will schedule her own mammogram  Will look into coverage for zoster vaccine  She plans to see podiatry re: toe nail care

## 2016-12-27 NOTE — Assessment & Plan Note (Signed)
Rev dexa 3/16 Has had 5 y of evista No falls or fx  Clarified vit D dosing She wants to wait another year on next dexa  Disc need for calcium/ vitamin D/ wt bearing exercise and bone density test every 2 y to monitor Disc safety/ fracture risk in detail

## 2017-01-17 DIAGNOSIS — H401131 Primary open-angle glaucoma, bilateral, mild stage: Secondary | ICD-10-CM | POA: Diagnosis not present

## 2017-01-26 ENCOUNTER — Other Ambulatory Visit: Payer: Self-pay | Admitting: Family Medicine

## 2017-01-26 DIAGNOSIS — Z1231 Encounter for screening mammogram for malignant neoplasm of breast: Secondary | ICD-10-CM

## 2017-01-31 ENCOUNTER — Ambulatory Visit
Admission: RE | Admit: 2017-01-31 | Discharge: 2017-01-31 | Disposition: A | Discharge status: Home / Self Care | Payer: Medicare HMO | Source: Ambulatory Visit | Attending: Family Medicine | Admitting: Family Medicine

## 2017-01-31 DIAGNOSIS — Z1231 Encounter for screening mammogram for malignant neoplasm of breast: Secondary | ICD-10-CM | POA: Insufficient documentation

## 2017-04-03 DIAGNOSIS — R69 Illness, unspecified: Secondary | ICD-10-CM | POA: Diagnosis not present

## 2017-06-08 ENCOUNTER — Ambulatory Visit (INDEPENDENT_AMBULATORY_CARE_PROVIDER_SITE_OTHER): Payer: Medicare HMO

## 2017-06-08 DIAGNOSIS — Z23 Encounter for immunization: Secondary | ICD-10-CM | POA: Diagnosis not present

## 2017-06-15 ENCOUNTER — Ambulatory Visit (INDEPENDENT_AMBULATORY_CARE_PROVIDER_SITE_OTHER)
Admission: RE | Admit: 2017-06-15 | Discharge: 2017-06-15 | Disposition: A | Discharge status: Home / Self Care | Payer: Medicare HMO | Source: Ambulatory Visit | Attending: Family Medicine | Admitting: Family Medicine

## 2017-06-15 ENCOUNTER — Encounter: Payer: Self-pay | Admitting: Family Medicine

## 2017-06-15 ENCOUNTER — Ambulatory Visit (INDEPENDENT_AMBULATORY_CARE_PROVIDER_SITE_OTHER): Payer: Medicare HMO | Admitting: Family Medicine

## 2017-06-15 VITALS — BP 130/78 | HR 55 | Temp 97.7°F | Ht 62.75 in | Wt 190.0 lb

## 2017-06-15 DIAGNOSIS — M25561 Pain in right knee: Secondary | ICD-10-CM

## 2017-06-15 DIAGNOSIS — M7989 Other specified soft tissue disorders: Secondary | ICD-10-CM | POA: Diagnosis not present

## 2017-06-15 DIAGNOSIS — M25461 Effusion, right knee: Secondary | ICD-10-CM

## 2017-06-15 NOTE — Patient Instructions (Signed)
Alleve 1 tabs by mouth two times a day over the counter: Take at least for 2 - 3 weeks. This is equal to a prescripton strength dose (GENERIC CHEAPER EQUIVALENT IS NAPROXEN SODIUM)

## 2017-06-15 NOTE — Progress Notes (Signed)
Dr. Frederico Hamman T. Brylon Brenning, MD, [facility] [place], [address] Phone: [phone] Fax: [phone]  06/15/2017  Patient: Monica Guerrero, MRN: [id number], DOB: November 25, 1940, 76 y.o.  Primary Physician:  Tower, Wynelle Fanny, MD   Chief Complaint  Patient presents with  . Knee Pain    Right   Subjective:   Monica Guerrero is a 76 y.o. very pleasant female patient who presents with the following:  Swollen in the inside. Just got on the stationary bite. Had gotten worse over the last few weeks. Sat got weak. She has had some occ giving way but no locking up of the joint. No specific injury. No prior injury or surgery. Pain is more medially and inside. Some pain limping and going up and down stairs.   Past Medical History, Surgical History, Social History, Family History, Problem List, Medications, and Allergies have been reviewed and updated if relevant.  Patient Active Problem List   Diagnosis Date Noted  . Obesity (BMI 30-39.9) 12/27/2016  . Routine general medical examination at a health care facility 12/08/2015  . GERD (gastroesophageal reflux disease) 12/08/2015  . 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 Estrogen deficiency 10/31/2014  . Encounter for screening mammogram for breast cancer 10/31/2014  . Vitamin D deficiency 10/31/2014  . Shoulder pain, right 10/22/2014  . Skin cancer screening 09/23/2013  . Encounter for Medicare annual wellness exam 09/13/2013  . Other screening mammogram 04/19/2011  . IRRITABLE BOWEL SYNDROME 01/22/2010  . HYPERCHOLESTEROLEMIA 10/03/2007  . GLAUCOMA 10/03/2007  . Essential hypertension 10/03/2007  . ALLERGIC RHINITIS 10/03/2007  . OSTEOARTHRITIS 10/03/2007  . Osteoporosis 10/03/2007    Past Medical History:  Diagnosis Date  . Allergy   . Arthritis    Osteoarthritis  . Hypertension   . Osteoporosis     Past Surgical History:  Procedure Laterality Date  . BASAL CELL CARCINOMA  EXCISION  12/2016   neck  . EYE SURGERY     Bilateral cataract Surgery  . SQUAMOUS CELL CARCINOMA EXCISION  12/2016   neck  . TONSILLECTOMY      Social History   Social History  . Marital status: Single    Spouse name: N/A  . Number of children: 2  . Years of education: N/A   Occupational History  . [place] History Main Topics  . Smoking status: Never Smoker  . Smokeless tobacco: Never Used  . Alcohol use No  . Drug use: No  . Sexual activity: Not on file   Other Topics Concern  . Not on file   Social History Narrative  . No narrative on file    Family History  Problem Relation Age of Onset  . Cancer Father        Kidney  . Heart disease Father   . Cancer Paternal Grandmother   . Dementia Mother   . Hypertension Brother   . Heart disease Maternal Uncle   . Stroke Maternal Grandmother   . Heart disease Maternal Grandfather     No Known Allergies  Medication list reviewed and updated in full in [place].  GEN: No fevers, chills. Nontoxic. Primarily MSK c/o today. MSK: Detailed in the HPI GI: tolerating PO intake without difficulty Neuro: No numbness, parasthesias, or tingling associated. Otherwise the pertinent positives of the ROS are noted above.   Objective:   BP 130/78   Pulse (!) 55   Temp 97.7 F (36.5  C) (Oral)   Ht 5' 2.75" (1.594 m)   Wt 190 lb (86.2 kg)   BMI 33.93 kg/m    GEN: WDWN, NAD, Non-toxic, Alert & Oriented x 3 HEENT: Atraumatic, Normocephalic.  Ears and Nose: No external deformity. EXTR: No clubbing/cyanosis/edema NEURO: Normal gait.  PSYCH: Normally interactive. Conversant. Not depressed or anxious appearing.  Calm demeanor.   Knee:  R Gait: Normal heel toe pattern ROM: lacks 2 deg ext, flexion to 110 Effusion: mod Echymosis or edema: none Patellar tendon NT Painful PLICA: neg Patellar grind: negative Medial and lateral patellar facet loading: negative medial and lateral joint lines: medial  pain  Mcmurray's pain Flexion-pinch pain Varus and valgus stress: stable Lachman: neg Ant and Post drawer: neg Hip abduction, IR, ER: WNL Hip flexion str: 5/5 Hip abd: 5/5 Quad: 5/5 VMO atrophy:No Hamstring concentric and eccentric: 5/5  Radiology: Dg Knee 4 Views W/patella Right  Result Date: 06/15/2017 CLINICAL DATA:  Knee pain.  Swelling. EXAM: RIGHT KNEE - COMPLETE 4+ VIEW COMPARISON:  No recent prior . FINDINGS: No acute bony or joint abnormality. No evidence of fracture or dislocation. IMPRESSION: No acute abnormality. Electronically Signed   By: Marcello Moores  Register   On: 06/15/2017 15:41     Assessment and Plan:   Acute pain of right knee - Plan: DG Knee 4 Views W/Patella Right  Knee effusion, right - Plan: DG Knee 4 Views W/Patella Right  >25 minutes spent in face to face time with patient, >50% spent in counselling or coordination of care   No significant OA on films. Probable deg meniscal tear.   I thought aspiration a reasonable next step, but she declined.  Placed in patellar J brace.  Cont NSAIDS, altered activity. A rehabilitation program from the [employer] was reviewed with the patient face to face for their condition.   Follow-up: 6 weeks if not improved.  Future Appointments Date Time Provider [place]  12/27/2017 1:45 PM Eustace Pen, LPN [facility]  01/02/2018 2:30 PM Tower, Wynelle Fanny, MD [facility]   Medications Discontinued During This Encounter  Medication Reason  . ibuprofen (ADVIL,MOTRIN) 200 MG tablet Patient Preference   Orders Placed This Encounter  Procedures  . DG Knee 4 Views W/Patella Right    Signed,  Frederico Hamman T. Kazuo Durnil, MD   Allergies as of 06/15/2017   No Known Allergies     Medication List       Accurate as of 06/15/17 11:59 PM. Always use your most recent med list.          aspirin 81 MG tablet Take 81 mg by mouth daily.   bisoprolol-hydrochlorothiazide  2.5-6.25 MG tablet Commonly known as:  ZIAC Take 1 tablet by mouth daily.   calcium carbonate 600 MG Tabs tablet Commonly known as:  OS-CAL Take 600 mg by mouth 2 (two) times daily with a meal.   chlorpheniramine 4 MG tablet Commonly known as:  CHLOR-TRIMETON Take 4 mg by mouth 2 (two) times daily as needed.   cholecalciferol 1000 units tablet Commonly known as:  VITAMIN D Take 2,000 Units by mouth daily.   GARLIC PO Take 1 capsule by mouth daily.   loratadine 10 MG tablet Commonly known as:  CLARITIN Take 10 mg by mouth as directed.   naproxen sodium 220 MG tablet Commonly known as:  ANAPROX Take 220 mg by mouth 2 (two) times daily.   phenylephrine 0.125 % nasal drops Commonly known as:  NEO-SYNEPHRINE Place 1 drop  into the nose as needed.   SYSTANE OP Apply to eye 2 (two) times daily as needed.   timolol 0.5 % ophthalmic solution Commonly known as:  BETIMOL Place 1 drop into both eyes daily.   TYLENOL ARTHRITIS PAIN 650 MG CR tablet Generic drug:  acetaminophen Take 650 mg by mouth every 8 (eight) hours as needed. Alternates with Ibuprofen.   verapamil 240 MG CR tablet Commonly known as:  CALAN-SR Take 1 tablet (240 mg total) by mouth at bedtime.

## 2017-07-17 DIAGNOSIS — H401131 Primary open-angle glaucoma, bilateral, mild stage: Secondary | ICD-10-CM | POA: Diagnosis not present

## 2017-07-24 DIAGNOSIS — H401131 Primary open-angle glaucoma, bilateral, mild stage: Secondary | ICD-10-CM | POA: Diagnosis not present

## 2017-11-23 DIAGNOSIS — C4492 Squamous cell carcinoma of skin, unspecified: Secondary | ICD-10-CM

## 2017-11-23 DIAGNOSIS — L814 Other melanin hyperpigmentation: Secondary | ICD-10-CM | POA: Diagnosis not present

## 2017-11-23 DIAGNOSIS — Z85828 Personal history of other malignant neoplasm of skin: Secondary | ICD-10-CM | POA: Diagnosis not present

## 2017-11-23 DIAGNOSIS — Z1283 Encounter for screening for malignant neoplasm of skin: Secondary | ICD-10-CM | POA: Diagnosis not present

## 2017-11-23 DIAGNOSIS — L578 Other skin changes due to chronic exposure to nonionizing radiation: Secondary | ICD-10-CM | POA: Diagnosis not present

## 2017-11-23 DIAGNOSIS — D044 Carcinoma in situ of skin of scalp and neck: Secondary | ICD-10-CM | POA: Diagnosis not present

## 2017-11-23 DIAGNOSIS — D485 Neoplasm of uncertain behavior of skin: Secondary | ICD-10-CM | POA: Diagnosis not present

## 2017-11-23 DIAGNOSIS — D229 Melanocytic nevi, unspecified: Secondary | ICD-10-CM | POA: Diagnosis not present

## 2017-11-23 DIAGNOSIS — L821 Other seborrheic keratosis: Secondary | ICD-10-CM | POA: Diagnosis not present

## 2017-11-23 HISTORY — DX: Squamous cell carcinoma of skin, unspecified: C44.92

## 2017-12-27 ENCOUNTER — Ambulatory Visit: Payer: Medicare HMO

## 2017-12-27 ENCOUNTER — Telehealth: Payer: Self-pay | Admitting: Family Medicine

## 2017-12-27 DIAGNOSIS — E559 Vitamin D deficiency, unspecified: Secondary | ICD-10-CM

## 2017-12-27 DIAGNOSIS — E7800 Pure hypercholesterolemia, unspecified: Secondary | ICD-10-CM

## 2017-12-27 DIAGNOSIS — I10 Essential (primary) hypertension: Secondary | ICD-10-CM

## 2017-12-27 NOTE — Telephone Encounter (Signed)
-----   Message from Eustace Pen, LPN sent at 9/32/3557  4:54 PM EDT ----- Regarding: Labs 4/25 Lab orders needed. Thank you.  Insurance:  [employer]

## 2017-12-28 ENCOUNTER — Ambulatory Visit (INDEPENDENT_AMBULATORY_CARE_PROVIDER_SITE_OTHER): Payer: Medicare HMO

## 2017-12-28 VITALS — BP 130/84 | HR 60 | Temp 97.8°F | Ht 63.0 in | Wt 188.5 lb

## 2017-12-28 DIAGNOSIS — I10 Essential (primary) hypertension: Secondary | ICD-10-CM | POA: Diagnosis not present

## 2017-12-28 DIAGNOSIS — Z0000 Encounter for general adult medical examination without abnormal findings: Secondary | ICD-10-CM | POA: Diagnosis not present

## 2017-12-28 DIAGNOSIS — E559 Vitamin D deficiency, unspecified: Secondary | ICD-10-CM | POA: Diagnosis not present

## 2017-12-28 DIAGNOSIS — E7800 Pure hypercholesterolemia, unspecified: Secondary | ICD-10-CM | POA: Diagnosis not present

## 2017-12-28 LAB — CBC WITH DIFFERENTIAL/PLATELET
BASOS PCT: 0.8 % (ref 0.0–3.0)
Basophils Absolute: 0 10*3/uL (ref 0.0–0.1)
Eosinophils Absolute: 0.2 10*3/uL (ref 0.0–0.7)
Eosinophils Relative: 3.9 % (ref 0.0–5.0)
HEMATOCRIT: 42.2 % (ref 36.0–46.0)
HEMOGLOBIN: 14.4 g/dL (ref 12.0–15.0)
LYMPHS PCT: 29.8 % (ref 12.0–46.0)
Lymphs Abs: 1.5 10*3/uL (ref 0.7–4.0)
MCHC: 34.1 g/dL (ref 30.0–36.0)
MCV: 89.6 fl (ref 78.0–100.0)
Monocytes Absolute: 0.5 10*3/uL (ref 0.1–1.0)
Monocytes Relative: 9.4 % (ref 3.0–12.0)
Neutro Abs: 2.8 10*3/uL (ref 1.4–7.7)
Neutrophils Relative %: 56.1 % (ref 43.0–77.0)
Platelets: 243 10*3/uL (ref 150.0–400.0)
RBC: 4.72 Mil/uL (ref 3.87–5.11)
RDW: 13.5 % (ref 11.5–15.5)
WBC: 4.9 10*3/uL (ref 4.0–10.5)

## 2017-12-28 LAB — COMPREHENSIVE METABOLIC PANEL
ALBUMIN: 4.3 g/dL (ref 3.5–5.2)
ALK PHOS: 67 U/L (ref 39–117)
ALT: 13 U/L (ref 0–35)
AST: 18 U/L (ref 0–37)
BUN: 18 mg/dL (ref 6–23)
CALCIUM: 9.5 mg/dL (ref 8.4–10.5)
CHLORIDE: 104 meq/L (ref 96–112)
CO2: 31 mEq/L (ref 19–32)
Creatinine, Ser: 0.79 mg/dL (ref 0.40–1.20)
GFR: 75.17 mL/min (ref >60.00)
Glucose, Bld: 94 mg/dL (ref 70–99)
POTASSIUM: 4.1 meq/L (ref 3.5–5.1)
Sodium: 142 mEq/L (ref 135–145)
TOTAL PROTEIN: 6.8 g/dL (ref 6.0–8.3)
Total Bilirubin: 0.4 mg/dL (ref 0.2–1.2)

## 2017-12-28 LAB — LIPID PANEL
CHOLESTEROL: 220 mg/dL — AB (ref 0–200)
HDL: 62 mg/dL (ref >39.00)
LDL Cholesterol: 140 mg/dL — ABNORMAL HIGH (ref 0–99)
NonHDL: 158
TRIGLYCERIDES: 88 mg/dL (ref 0.0–149.0)
Total CHOL/HDL Ratio: 4
VLDL: 17.6 mg/dL (ref 0.0–40.0)

## 2017-12-28 LAB — TSH: TSH: 5.86 u[IU]/mL — AB (ref 0.35–4.50)

## 2017-12-28 LAB — VITAMIN D 25 HYDROXY (VIT D DEFICIENCY, FRACTURES): VITD: 34.07 ng/mL (ref 30.00–100.00)

## 2017-12-28 NOTE — Patient Instructions (Signed)
Monica Guerrero , Thank you for taking time to come for your Medicare Wellness Visit. I appreciate your ongoing commitment to your health goals. Please review the following plan we discussed and let me know if I can assist you in the future.   These are the goals we discussed: Goals    . DIET - INCREASE WATER INTAKE     Starting 12/28/2017, I will attempt to drink at least 6-8 glasses of water daily.        This is a list of the screening recommended for you and due dates:  Health Maintenance  Topic Date Due  . Mammogram  01/31/2018  . Flu Shot  04/05/2018  . Tetanus Vaccine  08/08/2022  . DEXA scan (bone density measurement)  Completed  . Pneumonia vaccines  Completed    Preventive Care for Adults  A healthy lifestyle and preventive care can promote health and wellness. Preventive health guidelines for adults include the following key practices.  . A routine yearly physical is a good way to check with your health care provider about your health and preventive screening. It is a chance to share any concerns and updates on your health and to receive a thorough exam.  . Visit your dentist for a routine exam and preventive care every 6 months. Brush your teeth twice a day and floss once a day. Good oral hygiene prevents tooth decay and gum disease.  . The frequency of eye exams is based on your age, health, family medical history, use  of contact lenses, and other factors. Follow your health care provider's recommendations for frequency of eye exams.  . Eat a healthy diet. Foods like vegetables, fruits, whole grains, low-fat dairy products, and lean protein foods contain the nutrients you need without too many calories. Decrease your intake of foods high in solid fats, added sugars, and salt. Eat the right amount of calories for you. Get information about a proper diet from your health care provider, if necessary.  . Regular physical exercise is one of the most important things you can do  for your health. Most adults should get at least 150 minutes of moderate-intensity exercise (any activity that increases your heart rate and causes you to sweat) each week. In addition, most adults need muscle-strengthening exercises on 2 or more days a week.  Silver Sneakers may be a benefit available to you. To determine eligibility, you may visit the website: www.silversneakers.com or contact program at 559-716-7065 Mon-Fri between 8AM-8PM.   . Maintain a healthy weight. The body mass index (BMI) is a screening tool to identify possible weight problems. It provides an estimate of body fat based on height and weight. Your health care provider can find your BMI and can help you achieve or maintain a healthy weight.   For adults 20 years and older: ? A BMI below 18.5 is considered underweight. ? A BMI of 18.5 to 24.9 is normal. ? A BMI of 25 to 29.9 is considered overweight. ? A BMI of 30 and above is considered obese.   . Maintain normal blood lipids and cholesterol levels by exercising and minimizing your intake of saturated fat. Eat a balanced diet with plenty of fruit and vegetables. Blood tests for lipids and cholesterol should begin at age 69 and be repeated every 5 years. If your lipid or cholesterol levels are high, you are over 50, or you are at high risk for heart disease, you may need your cholesterol levels checked more frequently. Ongoing high  lipid and cholesterol levels should be treated with medicines if diet and exercise are not working.  . If you smoke, find out from your health care provider how to quit. If you do not use tobacco, please do not start.  . If you choose to drink alcohol, please do not consume more than 2 drinks per day. One drink is considered to be 12 ounces (355 mL) of beer, 5 ounces (148 mL) of wine, or 1.5 ounces (44 mL) of liquor.  . If you are 68-26 years old, ask your health care provider if you should take aspirin to prevent strokes.  . Use sunscreen.  Apply sunscreen liberally and repeatedly throughout the day. You should seek shade when your shadow is shorter than you. Protect yourself by wearing long sleeves, pants, a wide-brimmed hat, and sunglasses year round, whenever you are outdoors.  . Once a month, do a whole body skin exam, using a mirror to look at the skin on your back. Tell your health care provider of new moles, moles that have irregular borders, moles that are larger than a pencil eraser, or moles that have changed in shape or color.

## 2017-12-28 NOTE — Progress Notes (Signed)
PCP notes:   Health maintenance:  No gaps identified  Abnormal screenings:   Hearing - failed  Hearing Screening   125Hz  250Hz  500Hz  1000Hz  2000Hz  3000Hz  4000Hz  6000Hz  8000Hz   Right ear:   40 40 40  0    Left ear:   40 40 40  0     Fall risk - hx of single fall Fall Risk  12/28/2017 12/22/2016 12/08/2015 10/31/2014 09/13/2013  Falls in the past year? Yes No Yes Yes No  Comment foot became tangled in dog leash; fall caused injury to right hand; no medical treatment - - - -  Number falls in past yr: 1 - 1 2 or more -  Injury with Fall? Yes - No Yes -   Patient concerns:   Arthritis in both hands has become increasingly worse.  Allergy medication - wants to discuss best OTC option with PCP  Weakness and swelling in right lower leg; wears brace below knee  Nurse concerns:  None  Next PCP appt:   01/02/18 @ 1430  I reviewed health advisor's note, was available for consultation, and agree with documentation and plan. Loura Pardon MD

## 2017-12-28 NOTE — Progress Notes (Signed)
Subjective:   Monica Guerrero is a 77 y.o. female who presents for Medicare Annual (Subsequent) preventive examination.  Review of Systems:  N/A Cardiac Risk Factors include: advanced age (>38men, >65 women);obesity (BMI >30kg/m2);dyslipidemia;hypertension     Objective:     Vitals: BP 130/84 (BP Location: Right Arm, Patient Position: Sitting, Cuff Size: Normal)   Pulse 60   Temp 97.8 F (36.6 C) (Oral)   Ht 5\' 3"  (1.6 m) Comment: no shoes  Wt 188 lb 8 oz (85.5 kg)   SpO2 97%   BMI 33.39 kg/m   Body mass index is 33.39 kg/m.  Advanced Directives 12/28/2017 12/22/2016  Does Patient Have a Medical Advance Directive? Yes Yes  Type of Paramedic of [place];Living will [place];Living will  Does patient want to make changes to medical advance directive? No - Patient declined -  Copy of [place] in Chart? Yes No - copy requested  Would patient like information on creating a medical advance directive? No - Patient declined -    Tobacco Social History   Tobacco Use  Smoking Status Never Smoker  Smokeless Tobacco Never Used     Counseling given: No   Clinical Intake:  Pre-visit preparation completed: Yes  Pain : No/denies pain Pain Score: 0-No pain     Nutritional Status: BMI > 30  Obese Nutritional Risks: None Diabetes: No  How often do you need to have someone help you when you read instructions, pamphlets, or other written materials from your doctor or pharmacy?: 1 - Never  Interpreter Needed?: No  Comments: pt is divorced and lives alone Information entered by :: LPinson, LPN  Past Medical History:  Diagnosis Date  . Allergy   . Arthritis    Osteoarthritis  . Hypertension   . Osteoporosis    Past Surgical History:  Procedure Laterality Date  . BASAL CELL CARCINOMA EXCISION  12/2016   neck  . EYE SURGERY     Bilateral cataract Surgery  . SQUAMOUS CELL CARCINOMA EXCISION  12/2016   neck  . TONSILLECTOMY     Family History  Problem Relation Age of Onset  . Cancer Father        Kidney  . Heart disease Father   . Cancer Paternal Grandmother   . Dementia Mother   . Hypertension Brother   . Heart disease Maternal Uncle   . Stroke Maternal Grandmother   . Heart disease Maternal Grandfather    Social History   Socioeconomic History  . Marital status: Single    Spouse name: Not on file  . Number of children: 2  . Years of education: Not on file  . Highest education level: Not on file  Occupational History  . Occupation: Secretary/administrator  Social Needs  . Financial resource strain: Not on file  . Food insecurity:    Worry: Not on file    Inability: Not on file  . Transportation needs:    Medical: Not on file    Non-medical: Not on file  Tobacco Use  . Smoking status: Never Smoker  . Smokeless tobacco: Never Used  Substance and Sexual Activity  . Alcohol use: No    Alcohol/week: 0.0 oz  . Drug use: No  . Sexual activity: Not Currently  Lifestyle  . Physical activity:    Days per week: Not on file    Minutes per session: Not on file  . Stress: Not on file  Relationships  .  Social connections:    Talks on phone: Not on file    Gets together: Not on file    Attends religious service: Not on file    Active member of club or organization: Not on file    Attends meetings of clubs or organizations: Not on file    Relationship status: Not on file  Other Topics Concern  . Not on file  Social History Narrative  . Not on file    Outpatient Encounter Medications as of 12/28/2017  Medication Sig  . acetaminophen (TYLENOL ARTHRITIS PAIN) 650 MG CR tablet Take 650 mg by mouth every 8 (eight) hours as needed. Alternates with Ibuprofen.   Marland Kitchen aspirin 81 MG tablet Take 81 mg by mouth daily.    . bisoprolol-hydrochlorothiazide (ZIAC) 2.5-6.25 MG tablet Take 1 tablet by mouth daily.  . calcium carbonate (OS-CAL) 600 MG TABS Take 600 mg by mouth 2 (two) times daily  with a meal.    . chlorpheniramine (CHLOR-TRIMETON) 4 MG tablet Take 4 mg by mouth 2 (two) times daily as needed.    . cholecalciferol (VITAMIN D) 1000 UNITS tablet Take 2,000 Units by mouth daily.  Marland Kitchen GARLIC PO Take 1 capsule by mouth daily.  Marland Kitchen loratadine (CLARITIN) 10 MG tablet Take 10 mg by mouth as directed.    . naproxen sodium (ANAPROX) 220 MG tablet Take 220 mg by mouth 2 (two) times daily.  . phenylephrine (NEO-SYNEPHRINE) 0.125 % nasal drops Place 1 drop into the nose as needed.  Vladimir Faster Glycol-Propyl Glycol (SYSTANE OP) Apply to eye 2 (two) times daily as needed.  . timolol (BETIMOL) 0.5 % ophthalmic solution Place 1 drop into both eyes daily.   . verapamil (CALAN-SR) 240 MG CR tablet Take 1 tablet (240 mg total) by mouth at bedtime.   No facility-administered encounter medications on file as of 12/28/2017.     Activities of Daily Living In your present state of health, do you have any difficulty performing the following activities: 12/28/2017  Hearing? N  Vision? Y  Difficulty concentrating or making decisions? N  Walking or climbing stairs? N  Dressing or bathing? N  Doing errands, shopping? N  Preparing Food and eating ? N  Using the Toilet? N  In the past six months, have you accidently leaked urine? N  Do you have problems with loss of bowel control? N  Managing your Medications? N  Managing your Finances? N  Housekeeping or managing your Housekeeping? N  Some recent data might be hidden    Patient Care Team: Tower, Wynelle Fanny, MD as PCP - General Leandrew Koyanagi, MD as Referring Physician (Ophthalmology)    Assessment:   This is a routine wellness examination for [place].   Hearing Screening   125Hz  250Hz  500Hz  1000Hz  2000Hz  3000Hz  4000Hz  6000Hz  8000Hz   Right ear:   40 40 40  0    Left ear:   40 40 40  0    Vision Screening Comments: Nov 2018 with Dr. Wallace Going    Exercise Activities and Dietary recommendations Current Exercise Habits: The patient does  not participate in regular exercise at present, Exercise limited by: None identified  Goals    . DIET - INCREASE WATER INTAKE     Starting 12/28/2017, I will attempt to drink at least 6-8 glasses of water daily.        Fall Risk Fall Risk  12/28/2017 12/22/2016 12/08/2015 10/31/2014 09/13/2013  Falls in the past year? Yes No Yes Yes No  Comment  foot became tangled in dog leash; fall caused injury to right hand; no medical treatment - - - -  Number falls in past yr: 1 - 1 2 or more -  Injury with Fall? Yes - No Yes -   Depression Screen PHQ 2/9 Scores 12/28/2017 12/22/2016 12/08/2015 10/31/2014  PHQ - 2 Score 0 0 0 0  PHQ- 9 Score 0 - - -     Cognitive Function MMSE - Mini Mental State Exam 12/28/2017 12/22/2016  Orientation to time 5 5  Orientation to Place 5 5  Registration 3 3  Attention/ Calculation 0 0  Recall 3 3  Language- name 2 objects 0 0  Language- repeat 1 1  Language- follow 3 step command 3 3  Language- read & follow direction 0 0  Write a sentence 0 0  Copy design 0 0  Total score 20 20     PLEASE NOTE: A Mini-Cog screen was completed. Maximum score is 20. A value of 0 denotes this part of [place] MMSE was not completed or the patient failed this part of the Mini-Cog screening.   Mini-Cog Screening Orientation to Time - Max 5 pts Orientation to Place - Max 5 pts Registration - Max 3 pts Recall - Max 3 pts Language Repeat - Max 1 pts Language Follow 3 Step Command - Max 3 pts     Immunization History  Administered Date(s) Administered  . Influenza Split 06/14/2012  . Influenza Whole 06/22/2007, 06/02/2008, 06/10/2009, 06/09/2010  . Influenza,inj,Quad PF,6+ Mos 05/30/2013, 06/16/2014, 06/19/2015, 06/07/2016, 06/08/2017  . PPD Test 09/13/2013, 11/05/2013  . Pneumococcal Conjugate-13 10/31/2014  . Pneumococcal Polysaccharide-23 04/19/2011  . Td 09/05/2002, 08/08/2012    Screening Tests Health Maintenance  Topic Date Due  . MAMMOGRAM  01/31/2018  . INFLUENZA  VACCINE  04/05/2018  . TETANUS/TDAP  08/08/2022  . DEXA SCAN  Completed  . PNA vac Low Risk Adult  Completed       Plan:     I have personally reviewed, addressed, and noted the following in the patient's chart:  A. Medical and social history B. Use of alcohol, tobacco or illicit drugs  C. Current medications and supplements D. Functional ability and status E.  Nutritional status F.  Physical activity G. Advance directives H. List of other physicians I.  Hospitalizations, surgeries, and ER visits in previous 12 months J.  [place] to include hearing, vision, cognitive, depression L. Referrals and appointments - none  In addition, I have reviewed and discussed with patient certain preventive protocols, quality metrics, and best practice recommendations. A written personalized care plan for preventive services as well as general preventive health recommendations were provided to patient.  See attached scanned questionnaire for additional information.   Signed,   Lindell Noe, MHA, BS, LPN Health Coach

## 2018-01-02 ENCOUNTER — Ambulatory Visit (INDEPENDENT_AMBULATORY_CARE_PROVIDER_SITE_OTHER): Payer: Medicare HMO | Admitting: Family Medicine

## 2018-01-02 ENCOUNTER — Encounter: Payer: Self-pay | Admitting: Family Medicine

## 2018-01-02 VITALS — BP 130/88 | HR 61 | Temp 98.0°F | Ht 63.0 in | Wt 187.5 lb

## 2018-01-02 DIAGNOSIS — M810 Age-related osteoporosis without current pathological fracture: Secondary | ICD-10-CM | POA: Diagnosis not present

## 2018-01-02 DIAGNOSIS — E7800 Pure hypercholesterolemia, unspecified: Secondary | ICD-10-CM | POA: Diagnosis not present

## 2018-01-02 DIAGNOSIS — E559 Vitamin D deficiency, unspecified: Secondary | ICD-10-CM | POA: Diagnosis not present

## 2018-01-02 DIAGNOSIS — I10 Essential (primary) hypertension: Secondary | ICD-10-CM

## 2018-01-02 DIAGNOSIS — Z0000 Encounter for general adult medical examination without abnormal findings: Secondary | ICD-10-CM | POA: Diagnosis not present

## 2018-01-02 DIAGNOSIS — E669 Obesity, unspecified: Secondary | ICD-10-CM | POA: Diagnosis not present

## 2018-01-02 DIAGNOSIS — Z1231 Encounter for screening mammogram for malignant neoplasm of breast: Secondary | ICD-10-CM | POA: Diagnosis not present

## 2018-01-02 DIAGNOSIS — E038 Other specified hypothyroidism: Secondary | ICD-10-CM | POA: Insufficient documentation

## 2018-01-02 DIAGNOSIS — E2839 Other primary ovarian failure: Secondary | ICD-10-CM

## 2018-01-02 DIAGNOSIS — E039 Hypothyroidism, unspecified: Secondary | ICD-10-CM | POA: Diagnosis not present

## 2018-01-02 MED ORDER — BISOPROLOL-HYDROCHLOROTHIAZIDE 2.5-6.25 MG PO TABS: 1.0000 | ORAL_TABLET | Freq: Every day | ORAL | 3 refills | Status: DC

## 2018-01-02 MED ORDER — VERAPAMIL HCL ER 240 MG PO TBCR: 240.0000 mg | EXTENDED_RELEASE_TABLET | Freq: Every day | ORAL | 3 refills | Status: DC

## 2018-01-02 NOTE — Assessment & Plan Note (Signed)
Lab Results  Component Value Date   TSH 5.86 (H) 12/28/2017   will re check with FT4 in 1 mo  No symptoms

## 2018-01-02 NOTE — Assessment & Plan Note (Signed)
Disc goals for lipids and reasons to control them Rev labs with pt Rev low sat fat diet in detail  Continues to work on diet  Adv to cut back high fat dairy

## 2018-01-02 NOTE — Patient Instructions (Addendum)
Don't forget to schedule your mammogram for May  Add another 1000 iu of vitamin D daily to what you are already taking  Your level is drifting down   Try allegra or zyrtec for allergies if claritin is not working as well   If you are interested in the new shingles vaccine (Shingrix) - call your local pharmacy to check on coverage and availability  If interested-get on a wait list   We will re check thyroid in 1 month  Also we will set up your mammogram and bone density test

## 2018-01-02 NOTE — Assessment & Plan Note (Signed)
Discussed how this problem influences overall health and the risks it imposes  Reviewed plan for weight loss with lower calorie diet (via better food choices and also portion control or program like weight watchers) and exercise building up to or more than 30 minutes 5 days per week including some aerobic activity    

## 2018-01-02 NOTE — Assessment & Plan Note (Signed)
bp in fair control at this time  BP Readings from Last 1 Encounters:  01/02/18 130/88   No changes needed Disc lifstyle change with low sodium diet and exercise  Labs reviewed

## 2018-01-02 NOTE — Progress Notes (Signed)
Subjective:    Patient ID: Monica Guerrero, female    DOB: 1941-03-28, 77 y.o.   MRN: [id number]  HPI  Here for health maintenance exam and to review chronic medical problems    Still keeping great grand kids -incl a 71 year old  Is tiring but really enjoys it    Wt Readings from Last 3 Encounters:  01/02/18 187 lb 8 oz (85 kg)  12/28/17 188 lb 8 oz (85.5 kg)  06/15/17 190 lb (86.2 kg)  down 3 lb - thinks it is from being active  Eats quite healthy  No exercise besides kids and yardwork  33.21 kg/m   Had amw on 4/25 Missed highest tones on hearing screen bilat  (notices problems with background noise)  Not ready for hearing aide yet  One fall this year- dog leash incident  No fractures   Mammogram 5/18 neg -will schedule her next one  Self breast exam - no lumps   dexa 3/16 o OP mixed trend /overall stable  D level 34.0  (down from 48)  Was on evista for 5 years   Colon screening  Colonoscopy nl 7/11  cologuard neg 4/17 (this is good for 3 y)  Wearing a knee brace on L for susp torn meniscus  Is more comfortable wearing something just under knee cap   Zoster status -had zostavax  bp is stable today  No cp or palpitations or headaches or edema  No side effects to medicines  BP Readings from Last 3 Encounters:  01/02/18 130/88  12/28/17 130/84  06/15/17 130/78      Hyperlipidemia  Lab Results  Component Value Date   CHOL 220 (H) 12/28/2017   CHOL 245 (H) 12/22/2016   CHOL 228 (H) 12/01/2015   Lab Results  Component Value Date   HDL 62.00 12/28/2017   HDL 68.80 12/22/2016   HDL 63.30 12/01/2015   Lab Results  Component Value Date   LDLCALC 140 (H) 12/28/2017   LDLCALC 153 (H) 12/22/2016   LDLCALC 150 (H) 12/01/2015   Lab Results  Component Value Date   TRIG 88.0 12/28/2017   TRIG 118.0 12/22/2016   TRIG 70.0 12/01/2015   Lab Results  Component Value Date   CHOLHDL 4 12/28/2017   CHOLHDL 4 12/22/2016   CHOLHDL 4 12/01/2015   Lab Results    Component Value Date   LDLDIRECT 175.7 09/09/2013   LDLDIRECT 184.5 08/01/2012   LDLDIRECT 169.6 10/20/2011  staying away from fried foods and red meat  Cheese/ice cream - has to keep working on   Other labs Results for orders placed or performed in visit on 12/28/17  VITAMIN D 25 Hydroxy (Vit-D Deficiency, Fractures)  Result Value Ref Range   VITD 34.07 30.00 - 100.00 ng/mL  TSH  Result Value Ref Range   TSH 5.86 (H) 0.35 - 4.50 uIU/mL  Lipid panel  Result Value Ref Range   Cholesterol 220 (H) 0 - 200 mg/dL   Triglycerides 88.0 0.0 - 149.0 mg/dL   HDL 62.00 >39.00 mg/dL   VLDL 17.6 0.0 - 40.0 mg/dL   LDL Cholesterol 140 (H) 0 - 99 mg/dL   Total CHOL/HDL Ratio 4    NonHDL 158.00   Comprehensive metabolic panel  Result Value Ref Range   Sodium 142 135 - 145 mEq/L   Potassium 4.1 3.5 - 5.1 mEq/L   Chloride 104 96 - 112 mEq/L   CO2 31 19 - 32 mEq/L   Glucose, Bld 94 70 -  99 mg/dL   BUN 18 6 - 23 mg/dL   Creatinine, Ser 0.79 0.40 - 1.20 mg/dL   Total Bilirubin 0.4 0.2 - 1.2 mg/dL   Alkaline Phosphatase 67 39 - 117 U/L   AST 18 0 - 37 U/L   ALT 13 0 - 35 U/L   Total Protein 6.8 6.0 - 8.3 g/dL   Albumin 4.3 3.5 - 5.2 g/dL   Calcium 9.5 8.4 - 10.5 mg/dL   GFR 75.17 >60.00 mL/min  CBC with Differential/Platelet  Result Value Ref Range   WBC 4.9 4.0 - 10.5 K/uL   RBC 4.72 3.87 - 5.11 Mil/uL   Hemoglobin 14.4 12.0 - 15.0 g/dL   HCT 42.2 36.0 - 46.0 %   MCV 89.6 78.0 - 100.0 fl   MCHC 34.1 30.0 - 36.0 g/dL   RDW 13.5 11.5 - 15.5 %   Platelets 243.0 150.0 - 400.0 K/uL   Neutrophils Relative % 56.1 43.0 - 77.0 %   Lymphocytes Relative 29.8 12.0 - 46.0 %   Monocytes Relative 9.4 3.0 - 12.0 %   Eosinophils Relative 3.9 0.0 - 5.0 %   Basophils Relative 0.8 0.0 - 3.0 %   Neutro Abs 2.8 1.4 - 7.7 K/uL   Lymphs Abs 1.5 0.7 - 4.0 K/uL   Monocytes Absolute 0.5 0.1 - 1.0 K/uL   Eosinophils Absolute 0.2 0.0 - 0.7 K/uL   Basophils Absolute 0.0 0.0 - 0.1 K/uL     No hair  loss Is fatigued  No hx of thyroid problems   Patient Active Problem List   Diagnosis Date Noted  . 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 Obesity (BMI 30-39.9) 12/27/2016  . Routine general medical examination at a health care facility 12/08/2015  . GERD (gastroesophageal reflux disease) 12/08/2015  . 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 Estrogen deficiency 10/31/2014  . Encounter for screening mammogram for breast cancer 10/31/2014  . Vitamin D deficiency 10/31/2014  . Shoulder pain, right 10/22/2014  . Skin cancer screening 09/23/2013  . Encounter for Medicare annual wellness exam 09/13/2013  . Screening mammogram, encounter for 04/19/2011  . IRRITABLE BOWEL SYNDROME 01/22/2010  . HYPERCHOLESTEROLEMIA 10/03/2007  . GLAUCOMA 10/03/2007  . Essential hypertension 10/03/2007  . ALLERGIC RHINITIS 10/03/2007  . OSTEOARTHRITIS 10/03/2007  . Osteoporosis 10/03/2007   Past Medical History:  Diagnosis Date  . Allergy   . Arthritis    Osteoarthritis  . Hypertension   . Osteoporosis    Past Surgical History:  Procedure Laterality Date  . BASAL CELL CARCINOMA EXCISION  12/2016   neck  . EYE SURGERY     Bilateral cataract Surgery  . SQUAMOUS CELL CARCINOMA EXCISION  12/2016   neck  . TONSILLECTOMY     Social History   Tobacco Use  . Smoking status: Never Smoker  . Smokeless tobacco: Never Used  Substance Use Topics  . Alcohol use: No    Alcohol/week: 0.0 oz  . Drug use: No   Family History  Problem Relation Age of Onset  . Cancer Father        Kidney  . Heart disease Father   . Cancer Paternal Grandmother   . Dementia Mother   . Hypertension Brother   . Heart disease Maternal Uncle   . Stroke Maternal Grandmother   . Heart disease Maternal Grandfather    No Known Allergies Current Outpatient Medications on File Prior to Visit  Medication Sig Dispense Refill  . acetaminophen (TYLENOL ARTHRITIS PAIN) 650 MG CR tablet Take 650  mg by mouth every 8 (eight)  hours as needed. Alternates with Ibuprofen.     Marland Kitchen aspirin 81 MG tablet Take 81 mg by mouth daily.      . calcium carbonate (OS-CAL) 600 MG TABS Take 600 mg by mouth 2 (two) times daily with a meal.      . chlorpheniramine (CHLOR-TRIMETON) 4 MG tablet Take 4 mg by mouth 2 (two) times daily as needed.      . cholecalciferol (VITAMIN D) 1000 UNITS tablet Take 2,000 Units by mouth daily.    Marland Kitchen GARLIC PO Take 1 capsule by mouth daily.    Marland Kitchen loratadine (CLARITIN) 10 MG tablet Take 10 mg by mouth as directed.      . naproxen sodium (ANAPROX) 220 MG tablet Take 220 mg by mouth 2 (two) times daily.    . phenylephrine (NEO-SYNEPHRINE) 0.125 % nasal drops Place 1 drop into the nose as needed.    Vladimir Faster Glycol-Propyl Glycol (SYSTANE OP) Apply to eye 2 (two) times daily as needed.    . timolol (BETIMOL) 0.5 % ophthalmic solution Place 1 drop into both eyes daily.      No current facility-administered medications on file prior to visit.     Review of Systems  Constitutional: Positive for fatigue. Negative for activity change, appetite change, fever and unexpected weight change.  HENT: Positive for postnasal drip and rhinorrhea. Negative for congestion, ear pain, sinus pressure and sore throat.   Eyes: Negative for pain, redness and visual disturbance.  Respiratory: Negative for cough, shortness of breath and wheezing.   Cardiovascular: Negative for chest pain and palpitations.  Gastrointestinal: Negative for abdominal pain, blood in stool, constipation and diarrhea.  Endocrine: Negative for polydipsia and polyuria.  Genitourinary: Negative for dysuria, frequency and urgency.  Musculoskeletal: Negative for arthralgias, back pain and myalgias.       R knee pain   Skin: Negative for pallor and rash.  Allergic/Immunologic: Negative for environmental allergies.  Neurological: Negative for dizziness, syncope and headaches.  Hematological: Negative for adenopathy. Does not bruise/bleed easily.   Psychiatric/Behavioral: Negative for decreased concentration and dysphoric mood. The patient is not nervous/anxious.        Objective:   Physical Exam  Constitutional: She appears well-developed and well-nourished. No distress.  obese and well appearing   HENT:  Head: Normocephalic and atraumatic.  Right Ear: External ear normal.  Left Ear: External ear normal.  Mouth/Throat: Oropharynx is clear and moist.  Nares are boggy  Eyes: Pupils are equal, round, and reactive to light. Conjunctivae and EOM are normal. No scleral icterus.  Neck: Normal range of motion. Neck supple. No JVD present. Carotid bruit is not present. No thyromegaly present.  Cardiovascular: Normal rate, regular rhythm, normal heart sounds and intact distal pulses. Exam reveals no gallop.  Pulmonary/Chest: Effort normal and breath sounds normal. No respiratory distress. She has no wheezes. She exhibits no tenderness. No breast tenderness, discharge or bleeding.  Abdominal: Soft. Bowel sounds are normal. She exhibits no distension, no abdominal bruit and no mass. There is no tenderness.  Genitourinary: No breast tenderness, discharge or bleeding.  Genitourinary Comments: Breast exam: No mass, nodules, thickening, tenderness, bulging, retraction, inflamation, nipple discharge or skin changes noted.  No axillary or clavicular LA.      Musculoskeletal: Normal range of motion. She exhibits no edema or tenderness.  Brace on R knee  No kyphosis   Lymphadenopathy:    She has no cervical adenopathy.  Neurological: She is alert. She  has normal reflexes. No cranial nerve deficit. She exhibits normal muscle tone. Coordination normal.  Skin: Skin is warm and dry. No rash noted. No erythema. No pallor.  Solar lentigines diffusely   Psychiatric: She has a normal mood and affect.          Assessment & Plan:   Problem List Items Addressed This Visit      Cardiovascular and Mediastinum   Essential hypertension    bp in fair  control at this time  BP Readings from Last 1 Encounters:  01/02/18 130/88   No changes needed Disc lifstyle change with low sodium diet and exercise  Labs reviewed       Relevant Medications   bisoprolol-hydrochlorothiazide (ZIAC) 2.5-6.25 MG tablet   verapamil (CALAN-SR) 240 MG CR tablet     Endocrine   Subclinical hypothyroidism    Lab Results  Component Value Date   TSH 5.86 (H) 12/28/2017   will re check with FT4 in 1 mo  No symptoms         Musculoskeletal and Integument   Osteoporosis    dexa ordered  Past hx of evista  No fractures  Disc need for calcium/ vitamin D/ wt bearing exercise and bone density test every 2 y to monitor Disc safety/ fracture risk in detail          Other   Estrogen deficiency    Ref for dexa      Relevant Orders   DG Bone Density   HYPERCHOLESTEROLEMIA    Disc goals for lipids and reasons to control them Rev labs with pt Rev low sat fat diet in detail  Continues to work on diet  Adv to cut back high fat dairy      Relevant Medications   bisoprolol-hydrochlorothiazide (ZIAC) 2.5-6.25 MG tablet   verapamil (CALAN-SR) 240 MG CR tablet   Obesity (BMI 30-39.9)    Discussed how this problem influences overall health and the risks it imposes  Reviewed plan for weight loss with lower calorie diet (via better food choices and also portion control or program like weight watchers) and exercise building up to or more than 30 minutes 5 days per week including some aerobic activity         Routine general medical examination at a health care facility - Primary    Reviewed health habits including diet and exercise and skin cancer prevention Reviewed appropriate screening tests for age  Also reviewed health mt list, fam hx and immunization status , as well as social and family history   See HPI Labs rev  amw rev  Disc inc vit D intake Disc shingrix vaccine Ordered mammogram and dexa  Made plan for exercise        Screening  mammogram, encounter for    Scheduled annual screening mammogram Nl breast exam today  Encouraged monthly self exams        Relevant Orders   MM DIGITAL SCREENING BILATERAL   Vitamin D deficiency    Level is down from last year Will add 1000 iu more of D3 per day Disc imp for bone and overall health

## 2018-01-02 NOTE — Assessment & Plan Note (Signed)
Scheduled annual screening mammogram Nl breast exam today  Encouraged monthly self exams   

## 2018-01-02 NOTE — Assessment & Plan Note (Signed)
Reviewed health habits including diet and exercise and skin cancer prevention Reviewed appropriate screening tests for age  Also reviewed health mt list, fam hx and immunization status , as well as social and family history   See HPI Labs rev  amw rev  Disc inc vit D intake Disc shingrix vaccine Ordered mammogram and dexa  Made plan for exercise

## 2018-01-02 NOTE — Assessment & Plan Note (Signed)
Ref for dexa 

## 2018-01-02 NOTE — Assessment & Plan Note (Signed)
dexa ordered  Past hx of evista  No fractures  Disc need for calcium/ vitamin D/ wt bearing exercise and bone density test every 2 y to monitor Disc safety/ fracture risk in detail

## 2018-01-02 NOTE — Assessment & Plan Note (Signed)
Level is down from last year Will add 1000 iu more of D3 per day Disc imp for bone and overall health

## 2018-01-22 DIAGNOSIS — H35371 Puckering of macula, right eye: Secondary | ICD-10-CM | POA: Diagnosis not present

## 2018-01-30 ENCOUNTER — Telehealth: Payer: Self-pay | Admitting: Family Medicine

## 2018-01-30 DIAGNOSIS — E039 Hypothyroidism, unspecified: Secondary | ICD-10-CM

## 2018-01-30 DIAGNOSIS — E038 Other specified hypothyroidism: Secondary | ICD-10-CM

## 2018-01-30 NOTE — Telephone Encounter (Signed)
-----   Message from Ellamae Sia sent at 01/22/2018 10:56 AM EDT ----- Regarding: Lab orders for Thursday, 5.30.19 Lab orders, thanks

## 2018-02-01 ENCOUNTER — Other Ambulatory Visit (INDEPENDENT_AMBULATORY_CARE_PROVIDER_SITE_OTHER): Payer: Medicare HMO

## 2018-02-01 DIAGNOSIS — E039 Hypothyroidism, unspecified: Secondary | ICD-10-CM | POA: Diagnosis not present

## 2018-02-01 DIAGNOSIS — E038 Other specified hypothyroidism: Secondary | ICD-10-CM

## 2018-02-01 LAB — TSH: TSH: 5.31 u[IU]/mL — ABNORMAL HIGH (ref 0.35–4.50)

## 2018-02-01 LAB — T4, FREE: Free T4: 0.9 ng/dL (ref 0.60–1.60)

## 2018-02-07 ENCOUNTER — Ambulatory Visit
Admission: RE | Admit: 2018-02-07 | Discharge: 2018-02-07 | Disposition: A | Discharge status: Home / Self Care | Payer: Medicare HMO | Source: Ambulatory Visit | Attending: Family Medicine | Admitting: Family Medicine

## 2018-02-07 DIAGNOSIS — E2839 Other primary ovarian failure: Secondary | ICD-10-CM

## 2018-02-07 DIAGNOSIS — Z780 Asymptomatic menopausal state: Secondary | ICD-10-CM | POA: Diagnosis not present

## 2018-02-07 DIAGNOSIS — M810 Age-related osteoporosis without current pathological fracture: Secondary | ICD-10-CM | POA: Diagnosis not present

## 2018-02-07 DIAGNOSIS — Z1231 Encounter for screening mammogram for malignant neoplasm of breast: Secondary | ICD-10-CM | POA: Insufficient documentation

## 2018-02-07 HISTORY — DX: Malignant (primary) neoplasm, unspecified: C80.1

## 2018-03-09 DIAGNOSIS — H04123 Dry eye syndrome of bilateral lacrimal glands: Secondary | ICD-10-CM | POA: Diagnosis not present

## 2018-06-14 ENCOUNTER — Ambulatory Visit (INDEPENDENT_AMBULATORY_CARE_PROVIDER_SITE_OTHER): Payer: Medicare HMO

## 2018-06-14 DIAGNOSIS — Z23 Encounter for immunization: Secondary | ICD-10-CM

## 2018-07-25 DIAGNOSIS — H401131 Primary open-angle glaucoma, bilateral, mild stage: Secondary | ICD-10-CM | POA: Diagnosis not present

## 2018-07-31 DIAGNOSIS — H401131 Primary open-angle glaucoma, bilateral, mild stage: Secondary | ICD-10-CM | POA: Diagnosis not present

## 2018-11-28 DIAGNOSIS — L820 Inflamed seborrheic keratosis: Secondary | ICD-10-CM | POA: Diagnosis not present

## 2018-11-28 DIAGNOSIS — L814 Other melanin hyperpigmentation: Secondary | ICD-10-CM | POA: Diagnosis not present

## 2018-11-28 DIAGNOSIS — L578 Other skin changes due to chronic exposure to nonionizing radiation: Secondary | ICD-10-CM | POA: Diagnosis not present

## 2018-11-28 DIAGNOSIS — D1800 Hemangioma unspecified site: Secondary | ICD-10-CM | POA: Diagnosis not present

## 2018-11-28 DIAGNOSIS — Z1283 Encounter for screening for malignant neoplasm of skin: Secondary | ICD-10-CM | POA: Diagnosis not present

## 2018-11-28 DIAGNOSIS — D229 Melanocytic nevi, unspecified: Secondary | ICD-10-CM | POA: Diagnosis not present

## 2018-11-28 DIAGNOSIS — B078 Other viral warts: Secondary | ICD-10-CM | POA: Diagnosis not present

## 2018-11-28 DIAGNOSIS — L570 Actinic keratosis: Secondary | ICD-10-CM | POA: Diagnosis not present

## 2018-11-28 DIAGNOSIS — Z85828 Personal history of other malignant neoplasm of skin: Secondary | ICD-10-CM | POA: Diagnosis not present

## 2019-01-02 ENCOUNTER — Ambulatory Visit: Payer: Medicare HMO

## 2019-01-04 ENCOUNTER — Encounter: Payer: Medicare HMO | Admitting: Family Medicine

## 2019-01-29 DIAGNOSIS — H353131 Nonexudative age-related macular degeneration, bilateral, early dry stage: Secondary | ICD-10-CM | POA: Diagnosis not present

## 2019-02-01 ENCOUNTER — Telehealth: Payer: Self-pay

## 2019-02-01 ENCOUNTER — Other Ambulatory Visit: Payer: Self-pay

## 2019-02-01 ENCOUNTER — Encounter: Payer: Self-pay | Admitting: Emergency Medicine

## 2019-02-01 ENCOUNTER — Emergency Department
Admission: EM | Admit: 2019-02-01 | Discharge: 2019-02-01 | Disposition: A | Discharge status: Home / Self Care | Payer: Medicare HMO | Attending: Emergency Medicine | Admitting: Emergency Medicine

## 2019-02-01 ENCOUNTER — Emergency Department: Payer: Medicare HMO

## 2019-02-01 DIAGNOSIS — Z7982 Long term (current) use of aspirin: Secondary | ICD-10-CM | POA: Insufficient documentation

## 2019-02-01 DIAGNOSIS — E039 Hypothyroidism, unspecified: Secondary | ICD-10-CM | POA: Diagnosis not present

## 2019-02-01 DIAGNOSIS — R002 Palpitations: Secondary | ICD-10-CM | POA: Diagnosis not present

## 2019-02-01 DIAGNOSIS — J9811 Atelectasis: Secondary | ICD-10-CM | POA: Diagnosis not present

## 2019-02-01 DIAGNOSIS — I10 Essential (primary) hypertension: Secondary | ICD-10-CM

## 2019-02-01 DIAGNOSIS — Z79899 Other long term (current) drug therapy: Secondary | ICD-10-CM | POA: Diagnosis not present

## 2019-02-01 LAB — BASIC METABOLIC PANEL
Anion gap: 12 (ref 5–15)
BUN: 18 mg/dL (ref 8–23)
CO2: 28 mmol/L (ref 22–32)
Calcium: 9.9 mg/dL (ref 8.9–10.3)
Chloride: 103 mmol/L (ref 98–111)
Creatinine, Ser: 0.78 mg/dL (ref 0.44–1.00)
GFR calc Af Amer: >60 mL/min (ref >60)
GFR calc non Af Amer: >60 mL/min (ref >60)
Glucose, Bld: 105 mg/dL — ABNORMAL HIGH (ref 70–99)
Potassium: 3.9 mmol/L (ref 3.5–5.1)
Sodium: 143 mmol/L (ref 135–145)

## 2019-02-01 LAB — CBC
HCT: 45.3 % (ref 36.0–46.0)
Hemoglobin: 15.3 g/dL — ABNORMAL HIGH (ref 12.0–15.0)
MCH: 29.4 pg (ref 26.0–34.0)
MCHC: 33.8 g/dL (ref 30.0–36.0)
MCV: 86.9 fL (ref 80.0–100.0)
Platelets: 241 10*3/uL (ref 150–400)
RBC: 5.21 MIL/uL — ABNORMAL HIGH (ref 3.87–5.11)
RDW: 13.2 % (ref 11.5–15.5)
WBC: 7.2 10*3/uL (ref 4.0–10.5)
nRBC: 0 % (ref 0.0–0.2)

## 2019-02-01 LAB — TROPONIN I
Troponin I: <0.03 ng/mL (ref <0.03)
Troponin I: <0.03 ng/mL (ref <0.03)

## 2019-02-01 MED ORDER — SODIUM CHLORIDE 0.9 % IV BOLUS
1000.0000 mL | Freq: Once | INTRAVENOUS | Status: AC
  Administered 2019-02-01: 08:00:00 1000 mL via INTRAVENOUS

## 2019-02-01 NOTE — ED Provider Notes (Signed)
[facility] Emergency Department Provider Note  ____________________________________________  Time seen: Approximately 7:36 AM  I have reviewed the triage vital signs and the nursing notes.   HISTORY  Chief Complaint Hypertension   HPI  Monica Guerrero is a 78 y.o. female with a history of hypertension, arthritis, osteoporosis who presents for evaluation of elevated blood pressure. Patient reports that yesterday evening she had an episode where she felt her heart was racing and pounding in her chest associated with tingling of bilateral hands and feet.  She denies any chest pain, dizziness, or shortness of breath associated with it.  Her son bought her a new blood pressure cuff during that episode.  When she check her blood pressure was elevated with systolics in the 956L.  She went to bed but could not fall asleep, she reports that she felt anxious because her blood pressure was elevated.  She again checked in the middle of the night and was persistently elevated and so was it again this morning which prompted her visit to the emergency room.  She endorses compliance with her antihypertensive medications although has not taken them today yet.  She denies any further episodes of palpitations.  She denies cough, fever, vomiting or diarrhea.   Past Medical History:  Diagnosis Date   Allergy    Arthritis    Osteoarthritis   Cancer ([place])    skin   Hypertension    Osteoporosis     Patient Active Problem List   Diagnosis Date Noted   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   Obesity (BMI 30-39.9) 12/27/2016   Routine general medical examination at a health care facility 12/08/2015   GERD (gastroesophageal reflux disease) 12/08/2015   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   Estrogen deficiency 10/31/2014   Encounter for screening mammogram for breast cancer 10/31/2014   Vitamin D deficiency 10/31/2014   Shoulder pain, right 10/22/2014   Skin cancer  screening 09/23/2013   Encounter for Medicare annual wellness exam 09/13/2013   Screening mammogram, encounter for 04/19/2011   IRRITABLE BOWEL SYNDROME 01/22/2010   HYPERCHOLESTEROLEMIA 10/03/2007   GLAUCOMA 10/03/2007   Essential hypertension 10/03/2007   ALLERGIC RHINITIS 10/03/2007   OSTEOARTHRITIS 10/03/2007   Osteoporosis 10/03/2007    Past Surgical History:  Procedure Laterality Date   BASAL CELL CARCINOMA EXCISION  12/2016   neck   EYE SURGERY     Bilateral cataract Surgery   SQUAMOUS CELL CARCINOMA EXCISION  12/2016   neck   TONSILLECTOMY      Prior to Admission medications   Medication Sig Start Date End Date Taking? Authorizing Provider  acetaminophen (TYLENOL ARTHRITIS PAIN) 650 MG CR tablet Take 650 mg by mouth every 8 (eight) hours as needed. Alternates with Ibuprofen.    Yes [provider]  aspirin 81 MG tablet Take 81 mg by mouth daily.     Yes [provider]  bisoprolol-hydrochlorothiazide (ZIAC) 2.5-6.25 MG tablet Take 1 tablet by mouth daily. 01/02/18  Yes Tower, Wynelle Fanny, MD  calcium carbonate (OS-CAL) 600 MG TABS Take 600 mg by mouth daily.    Yes [provider]  chlorpheniramine (CHLOR-TRIMETON) 4 MG tablet Take 4 mg by mouth 2 (two) times daily as needed.     Yes [provider]  cholecalciferol (VITAMIN D) 1000 UNITS tablet Take 2,000 Units by mouth daily.   Yes [provider]  GARLIC PO Take 1 capsule by mouth daily.   Yes [provider]  Lifitegrast Shirley Friar) 5 % SOLN Place  1 drop into both eyes 2 (two) times a day.   Yes [provider]  loratadine (CLARITIN) 10 MG tablet Take 10 mg by mouth as directed.     Yes [provider]  Multiple Vitamins-Minerals (PRESERVISION AREDS 2) CAPS Take 1 capsule by mouth 2 (two) times a day.   Yes [provider]  phenylephrine (NEO-SYNEPHRINE) 0.125 % nasal drops Place 1 drop into the nose as needed.   Yes [provider]  Polyethyl Glycol-Propyl Glycol (SYSTANE OP) Apply to eye 2 (two) times daily as needed.   Yes [provider]  timolol (BETIMOL) 0.5 % ophthalmic solution Place 1 drop into both eyes daily.    Yes [provider]  verapamil (CALAN-SR) 240 MG CR tablet Take 1 tablet (240 mg total) by mouth at bedtime. 01/02/18  Yes Tower, Wynelle Fanny, MD    Allergies Patient has no known allergies.  Family History  Problem Relation Age of Onset   Cancer Father        Kidney   Heart disease Father    Cancer Paternal Grandmother    Dementia Mother    Hypertension Brother    Heart disease Maternal Uncle    Stroke Maternal Grandmother    Heart disease Maternal Grandfather     Social History Social History   Tobacco Use   Smoking status: Never Smoker   Smokeless tobacco: Never Used  Substance Use Topics   Alcohol use: No    Alcohol/week: 0.0 standard drinks   Drug use: No    Review of Systems  Constitutional: Negative for fever. Eyes: Negative for visual changes. ENT: Negative for sore throat. Neck: No neck pain  Cardiovascular: Negative for chest pain. + palpitations Respiratory: Negative for shortness of breath. Gastrointestinal: Negative for abdominal pain, vomiting or diarrhea. Genitourinary: Negative for dysuria. Musculoskeletal: Negative for back pain. Skin: Negative for rash. Neurological: Negative for headaches, weakness or numbness. + tingling of hands and feet Psych: No SI or HI  ____________________________________________   PHYSICAL EXAM:  VITAL SIGNS: ED Triage Vitals  Enc Vitals Group     BP 02/01/19 0449 (!) 164/78     Pulse Rate 02/01/19 0449 73     Resp 02/01/19 0449 16     Temp 02/01/19 0449 97.7 F (36.5 C)     Temp Source 02/01/19 0449 Oral     SpO2 02/01/19 0449 96 %     Weight 02/01/19 0450 185 lb (83.9 kg)     Height 02/01/19 0450 5' 3.5" (1.613 m)     Head Circumference --      Peak Flow --      Pain Score  --      Pain Loc --      Pain Edu? --      Excl. in [place]? --     Constitutional: Alert and oriented. Well appearing and in no apparent distress. HEENT:      Head: Normocephalic and atraumatic.         Eyes: Conjunctivae are normal. Sclera is non-icteric.       Mouth/Throat: Mucous membranes are moist.       Neck: Supple with no signs of meningismus. Cardiovascular: Regular rate and rhythm. No murmurs, gallops, or rubs. 2+ symmetrical distal pulses are present in all extremities. No JVD. Respiratory: Normal respiratory effort. Lungs are clear to auscultation bilaterally. No wheezes, crackles, or rhonchi.  Gastrointestinal: Soft, non tender, and non distended with positive bowel sounds. No rebound or guarding.  Musculoskeletal: Nontender with normal range of motion in all extremities. No edema, cyanosis, or erythema of extremities. Neurologic: Normal speech and language. Face is symmetric. Moving all extremities. No gross focal neurologic deficits are appreciated. Skin: Skin is warm, dry and intact. No rash noted. Psychiatric: Mood and affect are normal. Speech and behavior are normal.  ____________________________________________   LABS (all labs ordered are listed, but only abnormal results are displayed)  Labs Reviewed  BASIC METABOLIC PANEL - Abnormal; Notable for the following components:      Result Value   Glucose, Bld 105 (*)    All other components within normal limits  CBC - Abnormal; Notable for the following components:   RBC 5.21 (*)    Hemoglobin 15.3 (*)    All other components within normal limits  TROPONIN I  TROPONIN I   ____________________________________________  EKG  ED ECG REPORT I, Rudene Re, the attending physician, personally viewed and interpreted this ECG.  Normal sinus rhythm, rate of 73, normal intervals, normal axis, minimal ST depressions in inferior and lateral leads with no ST elevation.  This findings are unchanged from  prior. ____________________________________________  RADIOLOGY  I have personally reviewed the images performed during this visit and I agree with the Radiologist's read.   Interpretation by Radiologist:  Dg Chest 2 View  Result Date: 02/01/2019 CLINICAL DATA:  Elevated blood pressure. EXAM: CHEST - 2 VIEW COMPARISON:  12/14/2014. FINDINGS: Mediastinum and hilar structures normal. Stable cardiomegaly. No pulmonary venous congestion. Mild bibasilar atelectasis. No pleural effusion or pneumothorax IMPRESSION: 1.  Stable cardiomegaly.  No pulmonary venous congestion. 2.  Mild bibasilar atelectasis. Electronically Signed   By: Marcello Moores  Register   On: 02/01/2019 06:45     ____________________________________________   PROCEDURES  Procedure(s) performed: None Procedures Critical Care performed:  None ____________________________________________   INITIAL IMPRESSION / ASSESSMENT AND PLAN / ED COURSE   78 y.o. female with a history of hypertension, arthritis, osteoporosis who presents for evaluation of elevated blood pressure in the setting of one episode of palpitations last night. BP is normal in the ED and currently 142/92. Patient is neuro intact with strong and equal pulses x 4. CXR WNL with no evidence of pulmonary edema, no widened mediastinum.  First troponin is negative.  Second troponin is pending.  Will give her breakfast and give her her morning meds.  Patient is EKG is unchanged from prior.  We will continue to monitor on telemetry for any signs of dysrhythmias.  Aortic dissection considered, however there were with no typical symptoms of chest pain radiating through to intrascapular back, no severe hypertension, symmetric bilateral radial pulses, no associated neurologic deficits, no associated abdominal or lower extremity symptoms, no marfanoid features or evidence of underlying connective tissue disorder, and chest x-ray is without evidence of mediastinal widening. Ddx dysrhythmia  versus anxiety versus ACS.     _________________________ 8:56 AM on 02/01/2019 -----------------------------------------  Patient monitor on telemetry for 4 hours with no signs of dysrhythmias.  Continues back to baseline with no further complaints.  Second troponin is negative.  Blood pressure remains within normal range with systolics in the 939Q.  Labs showing no endorgan damage.  Recommended keeping a blood pressure diary over the weekend and follow-up with PCP on Monday.  Discussed return precautions for chest pain, dizziness, shortness of breath, elevated blood pressure, or any signs of stroke.  Patient is comfortable with the plan.   As part of my medical decision making, I reviewed the following data within  the [facility] notes reviewed and incorporated, Labs reviewed , EKG interpreted , Old EKG reviewed, Old chart reviewed, Radiograph reviewed , Notes from prior ED visits and [place] Controlled Substance Database    Pertinent labs & imaging results that were available during my care of the patient were reviewed by me and considered in my medical decision making (see chart for details).    ____________________________________________   FINAL CLINICAL IMPRESSION(S) / ED DIAGNOSES  Final diagnoses:  Essential hypertension  Palpitations      NEW MEDICATIONS STARTED DURING THIS VISIT:  ED Discharge Orders    None       Note:  This document was prepared using Dragon voice recognition software and may include unintentional dictation errors.    Alfred Levins, [place], MD 02/01/19 1430

## 2019-02-01 NOTE — Discharge Instructions (Addendum)
As we discussed, make sure to keep a blood pressure diary over the weekend.  Record your blood pressure at least 3 times a day.  Follow-up with your doctor on Monday.  Return to the emergency room if you have chest pain, shortness of breath, dizziness, headache, unilateral weakness or numbness, or difficulty walking.

## 2019-02-01 NOTE — ED Triage Notes (Signed)
Pt reports she had episode of racing heart rate on 5/28 and checked BP, BP elevated. Pt went to bed, had trouble sleeping. Pt checked BP again this AM and BP elevated at 197/103. Pt experiencing tingling in hands and feet. Pt denies SOB, N/V. Pt with hx/o hypertension and has been compliant with medications.

## 2019-02-01 NOTE — Telephone Encounter (Signed)
Per chart review tab pt is at [facility] today.

## 2019-02-01 NOTE — ED Notes (Signed)
Patient returned from X-ray 

## 2019-02-01 NOTE — Telephone Encounter (Signed)
Aware, will watch for notes, thanks

## 2019-02-01 NOTE — ED Notes (Signed)
Pt given meal and drink, taking morning meds from home per MD request.

## 2019-02-01 NOTE — Telephone Encounter (Signed)
[facility] Patient Name: Monica Guerrero Gender: Female DOB: 10/29/1939 Age: 78 Y 3 M 5 D Return Phone Number: [phone] (Primary) Address: City/State/Zip: [place] [address] Client [facility] Night - Client Client Site [place] Physician AA - PHYSICIAN, Verita Schneiders- MD Contact Type Call Who Is Calling Patient / Member / Family / Caregiver Call Type Triage / Clinical Caller Name Noni Saupe Relationship To Patient Daughter Return Phone Number [phone] (Primary) Chief Complaint Blood Pressure High Reason for Call Request to Schedule Office Appointment Initial Comment Mother is on heart medication, just told her that Blood pressure 155/94, pulse 74, Wants to know if should go to ED or can be seen in the morning. Translation No Nurse Assessment Nurse: Andree Elk, RN, Crystal Date/Time (Eastern Time): 01/31/2019 11:37:17 PM Confirm and document reason for call. If symptomatic, describe symptoms. ---Caller states mother's BP was 155/94. Takes medication for heart rhythm. Had a headache. Says she is very stubborn and does not want to go to the ER, or the office. Has the patient had close contact with a person known or suspected to have the novel coronavirus illness OR traveled / lives in area with major community spread (including international travel) in the last 14 days from the onset of symptoms? * If Asymptomatic, screen for exposure and travel within the last 14 days. ---Not Applicable Does the patient have any new or worsening symptoms? ---Yes Will a triage be completed? ---Yes Related visit to physician within the last 2 weeks? ---No Does the PT have any chronic conditions? (i.e. diabetes, asthma, this includes High risk factors for pregnancy, etc.) ---Yes List chronic conditions. ---Abnormal heart rhythm, eye disease,  allergy Is this a behavioral health or substance abuse call? ---No Guidelines Guideline Title Affirmed Question Affirmed Notes Nurse Date/Time (Eastern Time) High Blood Pressure [1] Taking BP medications AND [2] feels is having side effects (e.g., impotence, cough, dizzy upon standing) Adams, RN, Crystal 01/31/2019 11:41:15 PM PLEASE NOTE: All timestamps contained within this report are represented as [place] Standard Time. CONFIDENTIALTY NOTICE: This fax transmission is intended only for the addressee. It contains information that is legally privileged, confidential or otherwise protected from use or disclosure. If you are not the intended recipient, you are strictly prohibited from reviewing, disclosing, copying using or disseminating any of this information or taking any action in reliance on or regarding this information. If you have received this fax in error, please notify us immediately by telephone so that we can arrange for its return to [place]. Phone: [phone], Toll-Free: [phone], Fax: [phone] Page: 2 of 2 Call Id: [id number] [place]. Time Eilene Ghazi Time) Disposition Final User 01/31/2019 11:24:30 PM Send To Nurse Ria Comment, RN, April 01/31/2019 11:24:58 PM Send To Nurse Ria Comment, RN, April 01/31/2019 11:47:10 PM SEE PCP WITHIN 3 DAYS Yes Andree Elk, RN, Interior and spatial designer Understands Yes PreDisposition Did not know what to do Care Advice Given Per Guideline SEE PCP WITHIN 3 DAYS: * You need to be seen within 2 or 3 days. Call your doctor (or NP/PA) during regular office hours and make an appointment. A clinic or urgent care center are good places to go for care if your doctor's office is closed or you can't get an appointment. NOTE: If office will be open tomorrow, tell caller to call then, not in 3 days. CARE ADVICE given per High Blood Pressure (Adult) guideline. CALL  BACK IF: * You become worse. * Weakness or numbness of the face, arm or leg  on one side of the body occurs * Chest pain or difficulty breathing occurs * Difficulty walking, difficulty talking, or severe headache occurs Comments User: Pleas Patricia, RN Date/Time (Eastern Time): 01/31/2019 11:51:02 PM Caller states mother is not with her now. Asked if her mother would be willing to talk to me, she said she is probably sleeping. Triaged based on information given to me by the caller. Advised she call office in the morning to schedule an appointment. Instructed to call back if her mother is worse, short of breath, numbness/weakness, chest pain, dizzy, heart palpitations. Referrals REFERRED TO PCP OFFICE

## 2019-02-04 ENCOUNTER — Encounter: Payer: Self-pay | Admitting: Family Medicine

## 2019-02-04 ENCOUNTER — Ambulatory Visit (INDEPENDENT_AMBULATORY_CARE_PROVIDER_SITE_OTHER): Payer: Medicare HMO | Admitting: Family Medicine

## 2019-02-04 DIAGNOSIS — E669 Obesity, unspecified: Secondary | ICD-10-CM | POA: Diagnosis not present

## 2019-02-04 DIAGNOSIS — I10 Essential (primary) hypertension: Secondary | ICD-10-CM

## 2019-02-04 MED ORDER — HYDROCHLOROTHIAZIDE 25 MG PO TABS: 25.0000 mg | ORAL_TABLET | Freq: Every day | ORAL | 1 refills | Status: DC

## 2019-02-04 NOTE — Assessment & Plan Note (Signed)
Recent ED visit for elevated bp with some palpitations Reviewed hospital records, lab results and studies in detail  She feels back to normal  BP continues to run high at home - 150s/high 80s most of the time  More sodium in diet-working on that Less active- has committed to walk more outdoors now  Today will add hctz 25 mg daily  inst to call if side effects or problems (rev) She will continue the bystolic (this already has 6.25 mg of hct) and also verapamil Cannot raise either of these due to pulse in the 50s  She will check bp intermittently when relaxed  F/u in 2-3 wk virtually  If we stick with the hctz then will plan labs for bmet

## 2019-02-04 NOTE — Progress Notes (Signed)
Virtual Visit via Video Note  I connected with Monica Guerrero on 02/04/19 at  2:00 PM EDT by a video enabled telemedicine application and verified that I am speaking with the correct person using two identifiers.  Location: Patient: home Provider: office    I discussed the limitations of evaluation and management by telemedicine and the availability of in person appointments. The patient expressed understanding and agreed to proceed.  Video was initially good for the first 1/2 of the visit -then lost it and finished the rest by phone    History of Present Illness: Pt presents for f/u of ED visit 5/29  She was seen for elevated bp with episode of palpitations and tingling of hands and feet  Felt anxious because of the elevated bp - systolic in 557D  (220/254) Also she could not sleep and it was 3 am    ED w/u was re assuring  Nl cxr and exam  Labs incl troponins also re assuring as well as unchanged EKG no change  Systolic bp was in 270W   Dg Chest 2 View  Result Date: 02/01/2019 CLINICAL DATA:  Elevated blood pressure. EXAM: CHEST - 2 VIEW COMPARISON:  12/14/2014. FINDINGS: Mediastinum and hilar structures normal. Stable cardiomegaly. No pulmonary venous congestion. Mild bibasilar atelectasis. No pleural effusion or pneumothorax IMPRESSION: 1.  Stable cardiomegaly.  No pulmonary venous congestion. 2.  Mild bibasilar atelectasis. Electronically Signed   By: Marcello Moores  Register   On: 02/01/2019 06:45    Results for orders placed or performed during the hospital encounter of 23/76/28  Basic metabolic panel  Result Value Ref Range   Sodium 143 135 - 145 mmol/L   Potassium 3.9 3.5 - 5.1 mmol/L   Chloride 103 98 - 111 mmol/L   CO2 28 22 - 32 mmol/L   Glucose, Bld 105 (H) 70 - 99 mg/dL   BUN 18 8 - 23 mg/dL   Creatinine, Ser 0.78 0.44 - 1.00 mg/dL   Calcium 9.9 8.9 - 10.3 mg/dL   GFR calc non Af Amer >60 >60 mL/min   GFR calc Af Amer >60 >60 mL/min   Anion gap 12 5 - 15  CBC  Result  Value Ref Range   WBC 7.2 4.0 - 10.5 K/uL   RBC 5.21 (H) 3.87 - 5.11 MIL/uL   Hemoglobin 15.3 (H) 12.0 - 15.0 g/dL   HCT 45.3 36.0 - 46.0 %   MCV 86.9 80.0 - 100.0 fL   MCH 29.4 26.0 - 34.0 pg   MCHC 33.8 30.0 - 36.0 g/dL   RDW 13.2 11.5 - 15.5 %   Platelets 241 150 - 400 K/uL   nRBC 0.0 0.0 - 0.2 %  Troponin I - ONCE - STAT  Result Value Ref Range   Troponin I <0.03 <0.03 ng/mL  Troponin I - Once-Timed  Result Value Ref Range   Troponin I <0.03 <0.03 ng/mL     She takes ziac 2.5-6.25 for HTN Also verapamil SR 240   BP Readings from Last 3 Encounters:  02/01/19 (!) 153/68  01/02/18 130/88  12/28/17 130/84   Pulse Readings from Last 3 Encounters:  02/01/19 (!) 57  01/02/18 61  12/28/17 60   Bp is still higher than it should be at home  153/88  (140s to 150) Not as high as it was   Wt Readings from Last 3 Encounters:  02/01/19 185 lb (83.9 kg)  01/02/18 187 lb 8 oz (85 kg)  12/28/17 188 lb 8 oz (  85.5 kg)   She has been less active than usual She does watch what she eats (perhaps too much salt)  Had soy sauce one day   No swelling   Review of Systems  Constitutional: Negative for chills, diaphoresis, fever, malaise/fatigue and weight loss.  Eyes: Negative for blurred vision.  Respiratory: Negative for cough, shortness of breath and wheezing.   Cardiovascular: Negative for chest pain, palpitations, claudication and leg swelling.  Gastrointestinal: Negative for abdominal pain and nausea.  Musculoskeletal: Negative for myalgias.  Neurological: Negative for dizziness and headaches.  Psychiatric/Behavioral: The patient is not nervous/anxious.     Patient Active Problem List   Diagnosis Date Noted  . 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 Obesity (BMI 30-39.9) 12/27/2016  . Routine general medical examination at a health care facility 12/08/2015  . GERD (gastroesophageal reflux disease) 12/08/2015  . 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 Estrogen deficiency  10/31/2014  . Encounter for screening mammogram for breast cancer 10/31/2014  . Vitamin D deficiency 10/31/2014  . Shoulder pain, right 10/22/2014  . Skin cancer screening 09/23/2013  . Encounter for Medicare annual wellness exam 09/13/2013  . Screening mammogram, encounter for 04/19/2011  . IRRITABLE BOWEL SYNDROME 01/22/2010  . HYPERCHOLESTEROLEMIA 10/03/2007  . GLAUCOMA 10/03/2007  . Essential hypertension 10/03/2007  . ALLERGIC RHINITIS 10/03/2007  . OSTEOARTHRITIS 10/03/2007  . Osteoporosis 10/03/2007   Past Medical History:  Diagnosis Date  . Allergy   . Arthritis    Osteoarthritis  . Cancer ([place])    skin  . Hypertension   . Osteoporosis    Past Surgical History:  Procedure Laterality Date  . BASAL CELL CARCINOMA EXCISION  12/2016   neck  . EYE SURGERY     Bilateral cataract Surgery  . SQUAMOUS CELL CARCINOMA EXCISION  12/2016   neck  . TONSILLECTOMY     Social History   Tobacco Use  . Smoking status: Never Smoker  . Smokeless tobacco: Never Used  Substance Use Topics  . Alcohol use: No    Alcohol/week: 0.0 standard drinks  . Drug use: No   Family History  Problem Relation Age of Onset  . Cancer Father        Kidney  . Heart disease Father   . Cancer Paternal Grandmother   . Dementia Mother   . Hypertension Brother   . Heart disease Maternal Uncle   . Stroke Maternal Grandmother   . Heart disease Maternal Grandfather    No Known Allergies Current Outpatient Medications on File Prior to Visit  Medication Sig Dispense Refill  . acetaminophen (TYLENOL ARTHRITIS PAIN) 650 MG CR tablet Take 650 mg by mouth every 8 (eight) hours as needed. Alternates with Ibuprofen.     Marland Kitchen aspirin 81 MG tablet Take 81 mg by mouth daily.      . bisoprolol-hydrochlorothiazide (ZIAC) 2.5-6.25 MG tablet Take 1 tablet by mouth daily. 90 tablet 3  . calcium carbonate (OS-CAL) 600 MG TABS Take 600 mg by mouth daily.     . chlorpheniramine (CHLOR-TRIMETON) 4 MG tablet Take 4 mg  by mouth 2 (two) times daily as needed.      . cholecalciferol (VITAMIN D) 1000 UNITS tablet Take 2,000 Units by mouth daily.    Marland Kitchen GARLIC PO Take 1 capsule by mouth daily.    Marland Kitchen Lifitegrast (XIIDRA) 5 % SOLN Place 1 drop into both eyes 2 (two) times a day.    . loratadine (CLARITIN) 10 MG tablet Take 10 mg by mouth as directed.      Marland Kitchen  Multiple Vitamins-Minerals (PRESERVISION AREDS 2) CAPS Take 1 capsule by mouth 2 (two) times a day.    . phenylephrine (NEO-SYNEPHRINE) 0.125 % nasal drops Place 1 drop into the nose as needed.    Vladimir Faster Glycol-Propyl Glycol (SYSTANE OP) Apply to eye 2 (two) times daily as needed.    . timolol (BETIMOL) 0.5 % ophthalmic solution Place 1 drop into both eyes daily.     . verapamil (CALAN-SR) 240 MG CR tablet Take 1 tablet (240 mg total) by mouth at bedtime. 90 tablet 3   No current facility-administered medications on file prior to visit.     Observations/Objective: Patient appears well, in no distress Weight is baseline (obese) No facial swelling or asymmetry Normal voice-not hoarse and no slurred speech No obvious tremor or mobility impairment Moving neck and UEs normally Able to hear the call well  No cough or shortness of breath during interview  Talkative and mentally sharp with no cognitive changes No skin changes on face or neck , no rash or pallor Affect is normal    Assessment and Plan: Problem List Items Addressed This Visit      Cardiovascular and Mediastinum   Essential hypertension - Primary    Recent ED visit for elevated bp with some palpitations Reviewed hospital records, lab results and studies in detail  She feels back to normal  BP continues to run high at home - 150s/high 80s most of the time  More sodium in diet-working on that Less active- has committed to walk more outdoors now  Today will add hctz 25 mg daily  inst to call if side effects or problems (rev) She will continue the bystolic (this already has 6.25 mg of hct)  and also verapamil Cannot raise either of these due to pulse in the 50s  She will check bp intermittently when relaxed  F/u in 2-3 wk virtually  If we stick with the hctz then will plan labs for bmet        Relevant Medications   hydrochlorothiazide (HYDRODIURIL) 25 MG tablet     Other   Obesity (BMI 30-39.9)    Discussed how this problem influences overall health and the risks it imposes  Reviewed plan for weight loss with lower calorie diet (via better food choices and also portion control or program like weight watchers) and exercise building up to or more than 30 minutes 5 days per week including some aerobic activity   She will work on less processed food Also walk more Understands that wt loss will help her bp as well          Follow Up Instructions: Continue current medications and add another 25 mg of HCTZ daily in the am Alert me if side effects or problems  Continue to check blood pressure at different times - when relaxed  Our office will call you to schedule a 2-3 week virtual follow up  If we stick with that medication we will then plan labs   Continue to watch out for sodium in your diet  Also try to get out and walk more Drink plenty of water also    I discussed the assessment and treatment plan with the patient. The patient was provided an opportunity to ask questions and all were answered. The patient agreed with the plan and demonstrated an understanding of the instructions.   The patient was advised to call back or seek an in-person evaluation if the symptoms worsen or if the condition fails to  improve as anticipated.     Loura Pardon, MD

## 2019-02-04 NOTE — Assessment & Plan Note (Signed)
Discussed how this problem influences overall health and the risks it imposes  Reviewed plan for weight loss with lower calorie diet (via better food choices and also portion control or program like weight watchers) and exercise building up to or more than 30 minutes 5 days per week including some aerobic activity   She will work on less processed food Also walk more Understands that wt loss will help her bp as well

## 2019-02-04 NOTE — Patient Instructions (Signed)
Continue current medications and add another 25 mg of HCTZ daily in the am Alert me if side effects or problems  Continue to check blood pressure at different times - when relaxed  Our office will call you to schedule a 2-3 week virtual follow up  If we stick with that medication we will then plan labs   Continue to watch out for sodium in your diet  Also try to get out and walk more Drink plenty of water also

## 2019-02-13 ENCOUNTER — Other Ambulatory Visit: Payer: Self-pay | Admitting: Family Medicine

## 2019-02-20 ENCOUNTER — Other Ambulatory Visit: Payer: Self-pay

## 2019-02-20 ENCOUNTER — Encounter: Payer: Self-pay | Admitting: Family Medicine

## 2019-02-20 ENCOUNTER — Ambulatory Visit (INDEPENDENT_AMBULATORY_CARE_PROVIDER_SITE_OTHER): Payer: Medicare HMO | Admitting: Family Medicine

## 2019-02-20 VITALS — BP 131/67

## 2019-02-20 DIAGNOSIS — I10 Essential (primary) hypertension: Secondary | ICD-10-CM | POA: Diagnosis not present

## 2019-02-20 DIAGNOSIS — E669 Obesity, unspecified: Secondary | ICD-10-CM | POA: Diagnosis not present

## 2019-02-20 NOTE — Assessment & Plan Note (Signed)
bp in fair control at this time /improved with addn of more hctz No side effects  BP Readings from Last 1 Encounters:  02/20/19 131/67   No changes needed Most recent labs reviewed  Disc lifstyle change with low sodium diet and exercise  Labs planned for next month when she f/u for her annual visit

## 2019-02-20 NOTE — Assessment & Plan Note (Signed)
Again strongly urged her to work on diet/exercise Discussed how this problem influences overall health and the risks it imposes  Reviewed plan for weight loss with lower calorie diet (via better food choices and also portion control or program like weight watchers) and exercise building up to or more than 30 minutes 5 days per week including some aerobic activity    Made a plan to come up with an indoor exercise option like a video program/walking or equipment  Unsure if she is motivated

## 2019-02-20 NOTE — Progress Notes (Signed)
Virtual Visit via Telephone Note  I connected with Monica Guerrero on 02/20/19 at  2:00 PM EDT by telephone and verified that I am speaking with the correct person using two identifiers.  Location: Patient: home Provider: office    I discussed the limitations, risks, security and privacy concerns of performing an evaluation and management service by telephone and the availability of in person appointments. I also discussed with the patient that there may be a patient responsible charge related to this service. The patient expressed understanding and agreed to proceed.   History of Present Illness: Patient presents for f/u of chronic health problems  Feeling fine   Weight  Wt Readings from Last 3 Encounters:  02/01/19 185 lb (83.9 kg)  01/02/18 187 lb 8 oz (85 kg)  12/28/17 188 lb 8 oz (85.5 kg)  she does not weigh herself    She was seen last after f/u of ED visit for HTN We added another hctz 25 mg to her regimen She continues bystolic  Also verapamil   (no room to titrate due to pulse in 50s)  Also discussed plan for better diet and exercise  Also DASH eating   BP has come down - with the added hctz  No cp or palpitations or headaches or edema   (feels better than when it was high) No side effects to medicines  BP Readings from Last 3 Encounters:  02/01/19 (!) 153/68  01/02/18 130/88  12/28/17 130/84   at home today 131/67   Does not notice excessive urination   She is eating better- avoiding excess sodium /cutting back  Exercise - not doing /not ready to start that  Tried to walk a little bit when weather      Lab Results  Component Value Date   CREATININE 0.78 02/01/2019   BUN 18 02/01/2019   NA 143 02/01/2019   K 3.9 02/01/2019   CL 103 02/01/2019   CO2 28 02/01/2019      Lab Results  Component Value Date   CHOL 220 (H) 12/28/2017   HDL 62.00 12/28/2017   LDLCALC 140 (H) 12/28/2017   LDLDIRECT 175.7 09/09/2013   TRIG 88.0 12/28/2017   CHOLHDL 4  12/28/2017   She has lab/PE scheduled for July  No muscle cramps   Review of Systems  Constitutional: Negative for chills, fever, malaise/fatigue and weight loss.  Eyes: Negative for blurred vision.  Respiratory: Negative for cough and shortness of breath.   Cardiovascular: Negative for chest pain, palpitations and leg swelling.  Gastrointestinal: Negative for nausea.  Musculoskeletal: Negative for myalgias.       No muscle cramps  Skin: Negative for itching and rash.  Neurological: Negative for dizziness and headaches.  Endo/Heme/Allergies: Negative for polydipsia.    Patient Active Problem List   Diagnosis Date Noted  . 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 Obesity (BMI 30-39.9) 12/27/2016  . Routine general medical examination at a health care facility 12/08/2015  . GERD (gastroesophageal reflux disease) 12/08/2015  . 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 Estrogen deficiency 10/31/2014  . Encounter for screening mammogram for breast cancer 10/31/2014  . Vitamin D deficiency 10/31/2014  . Shoulder pain, right 10/22/2014  . Skin cancer screening 09/23/2013  . Encounter for Medicare annual wellness exam 09/13/2013  . Screening mammogram, encounter for 04/19/2011  . IRRITABLE BOWEL SYNDROME 01/22/2010  . HYPERCHOLESTEROLEMIA 10/03/2007  . GLAUCOMA 10/03/2007  . Essential hypertension 10/03/2007  . ALLERGIC RHINITIS 10/03/2007  . OSTEOARTHRITIS 10/03/2007  . Osteoporosis  10/03/2007   Past Medical History:  Diagnosis Date  . Allergy   . Arthritis    Osteoarthritis  . Cancer ([place])    skin  . Hypertension   . Osteoporosis    Past Surgical History:  Procedure Laterality Date  . BASAL CELL CARCINOMA EXCISION  12/2016   neck  . EYE SURGERY     Bilateral cataract Surgery  . SQUAMOUS CELL CARCINOMA EXCISION  12/2016   neck  . TONSILLECTOMY     Social History   Tobacco Use  . Smoking status: Never Smoker  . Smokeless tobacco: Never Used  Substance Use Topics   . Alcohol use: No    Alcohol/week: 0.0 standard drinks  . Drug use: No   Family History  Problem Relation Age of Onset  . Cancer Father        Kidney  . Heart disease Father   . Cancer Paternal Grandmother   . Dementia Mother   . Hypertension Brother   . Heart disease Maternal Uncle   . Stroke Maternal Grandmother   . Heart disease Maternal Grandfather    No Known Allergies Current Outpatient Medications on File Prior to Visit  Medication Sig Dispense Refill  . acetaminophen (TYLENOL ARTHRITIS PAIN) 650 MG CR tablet Take 650 mg by mouth every 8 (eight) hours as needed. Alternates with Ibuprofen.     Marland Kitchen aspirin 81 MG tablet Take 81 mg by mouth daily.      . bisoprolol-hydrochlorothiazide (ZIAC) 2.5-6.25 MG tablet TAKE 1 TABLET EVERY DAY 90 tablet 0  . calcium carbonate (OS-CAL) 600 MG TABS Take 600 mg by mouth daily.     . chlorpheniramine (CHLOR-TRIMETON) 4 MG tablet Take 4 mg by mouth 2 (two) times daily as needed.      . cholecalciferol (VITAMIN D) 1000 UNITS tablet Take 2,000 Units by mouth daily.    Marland Kitchen GARLIC PO Take 1 capsule by mouth daily.    . hydrochlorothiazide (HYDRODIURIL) 25 MG tablet Take 1 tablet (25 mg total) by mouth daily. 30 tablet 1  . Lifitegrast (XIIDRA) 5 % SOLN Place 1 drop into both eyes 2 (two) times a day.    . loratadine (CLARITIN) 10 MG tablet Take 10 mg by mouth as directed.      . Multiple Vitamins-Minerals (PRESERVISION AREDS 2) CAPS Take 1 capsule by mouth 2 (two) times a day.    . phenylephrine (NEO-SYNEPHRINE) 0.125 % nasal drops Place 1 drop into the nose as needed.    Vladimir Faster Glycol-Propyl Glycol (SYSTANE OP) Apply to eye 2 (two) times daily as needed.    . timolol (BETIMOL) 0.5 % ophthalmic solution Place 1 drop into both eyes daily.     . verapamil (CALAN-SR) 240 MG CR tablet TAKE 1 TABLET (240 MG TOTAL) BY MOUTH AT BEDTIME. 90 tablet 0   No current facility-administered medications on file prior to visit.     Observations/Objective: Pt  sounds like her usual self  Not hoarse or sob  Not in distress Affect is normal and cognitive status is normal  Does not c/o of pain or new medical problems  Answers questions appropriately  Assessment and Plan: Problem List Items Addressed This Visit      Cardiovascular and Mediastinum   Essential hypertension - Primary    bp in fair control at this time /improved with addn of more hctz No side effects  BP Readings from Last 1 Encounters:  02/20/19 131/67   No changes needed Most recent labs reviewed  Disc lifstyle change with low sodium diet and exercise  Labs planned for next month when she f/u for her annual visit         Other   Obesity (BMI 30-39.9)    Again strongly urged her to work on diet/exercise Discussed how this problem influences overall health and the risks it imposes  Reviewed plan for weight loss with lower calorie diet (via better food choices and also portion control or program like weight watchers) and exercise building up to or more than 30 minutes 5 days per week including some aerobic activity    Made a plan to come up with an indoor exercise option like a video program/walking or equipment  Unsure if she is motivated           Follow Up Instructions: Continue the extra hctz along with bystolic and verapamil I'm glad BP is better and you also feel better Continue watching sodium intake/processed foods  Also try to exercise (come up with an indoor plan for bad weather- like a video or indoor walking)  When weather is better, get outside  We will see you in a month for labs and annual exam    I discussed the assessment and treatment plan with the patient. The patient was provided an opportunity to ask questions and all were answered. The patient agreed with the plan and demonstrated an understanding of the instructions.   The patient was advised to call back or seek an in-person evaluation if the symptoms worsen or if the condition fails to improve  as anticipated.  I provided 15 minutes of non-face-to-face time during this encounter.   Loura Pardon, MD

## 2019-03-17 ENCOUNTER — Telehealth: Payer: Self-pay | Admitting: Family Medicine

## 2019-03-17 DIAGNOSIS — E559 Vitamin D deficiency, unspecified: Secondary | ICD-10-CM

## 2019-03-17 DIAGNOSIS — M810 Age-related osteoporosis without current pathological fracture: Secondary | ICD-10-CM

## 2019-03-17 DIAGNOSIS — E039 Hypothyroidism, unspecified: Secondary | ICD-10-CM

## 2019-03-17 DIAGNOSIS — E7800 Pure hypercholesterolemia, unspecified: Secondary | ICD-10-CM

## 2019-03-17 DIAGNOSIS — I10 Essential (primary) hypertension: Secondary | ICD-10-CM

## 2019-03-17 DIAGNOSIS — E038 Other specified hypothyroidism: Secondary | ICD-10-CM

## 2019-03-17 NOTE — Telephone Encounter (Signed)
-----   Message from Cloyd Stagers, RT sent at 03/14/2019  4:09 PM EDT ----- Regarding: Lab Orders for Monday 7.13.2020 Please place lab orders for Monday 7.13.2020, office visit for physical on Thursday 7.23.2020 Thank you, Dyke Maes RT(R)

## 2019-03-18 ENCOUNTER — Ambulatory Visit: Payer: Medicare HMO

## 2019-03-19 ENCOUNTER — Other Ambulatory Visit (INDEPENDENT_AMBULATORY_CARE_PROVIDER_SITE_OTHER): Payer: Medicare HMO

## 2019-03-19 ENCOUNTER — Other Ambulatory Visit: Payer: Self-pay | Admitting: Family Medicine

## 2019-03-19 DIAGNOSIS — E039 Hypothyroidism, unspecified: Secondary | ICD-10-CM | POA: Diagnosis not present

## 2019-03-19 DIAGNOSIS — E7800 Pure hypercholesterolemia, unspecified: Secondary | ICD-10-CM

## 2019-03-19 DIAGNOSIS — E038 Other specified hypothyroidism: Secondary | ICD-10-CM

## 2019-03-19 DIAGNOSIS — I10 Essential (primary) hypertension: Secondary | ICD-10-CM | POA: Diagnosis not present

## 2019-03-19 DIAGNOSIS — E559 Vitamin D deficiency, unspecified: Secondary | ICD-10-CM

## 2019-03-19 DIAGNOSIS — Z1231 Encounter for screening mammogram for malignant neoplasm of breast: Secondary | ICD-10-CM

## 2019-03-19 LAB — CBC WITH DIFFERENTIAL/PLATELET
Basophils Absolute: 0 10*3/uL (ref 0.0–0.1)
Basophils Relative: 0.6 % (ref 0.0–3.0)
Eosinophils Absolute: 0.2 10*3/uL (ref 0.0–0.7)
Eosinophils Relative: 2.8 % (ref 0.0–5.0)
HCT: 42.7 % (ref 36.0–46.0)
Hemoglobin: 14.3 g/dL (ref 12.0–15.0)
Lymphocytes Relative: 30.3 % (ref 12.0–46.0)
Lymphs Abs: 1.7 10*3/uL (ref 0.7–4.0)
MCHC: 33.4 g/dL (ref 30.0–36.0)
MCV: 88.9 fl (ref 78.0–100.0)
Monocytes Absolute: 0.7 10*3/uL (ref 0.1–1.0)
Monocytes Relative: 11.6 % (ref 3.0–12.0)
Neutro Abs: 3.1 10*3/uL (ref 1.4–7.7)
Neutrophils Relative %: 54.7 % (ref 43.0–77.0)
Platelets: 224 10*3/uL (ref 150.0–400.0)
RBC: 4.8 Mil/uL (ref 3.87–5.11)
RDW: 13.6 % (ref 11.5–15.5)
WBC: 5.6 10*3/uL (ref 4.0–10.5)

## 2019-03-19 LAB — LIPID PANEL
Cholesterol: 233 mg/dL — ABNORMAL HIGH (ref 0–200)
HDL: 62.3 mg/dL (ref >39.00)
LDL Cholesterol: 154 mg/dL — ABNORMAL HIGH (ref 0–99)
NonHDL: 170.8
Total CHOL/HDL Ratio: 4
Triglycerides: 84 mg/dL (ref 0.0–149.0)
VLDL: 16.8 mg/dL (ref 0.0–40.0)

## 2019-03-19 LAB — TSH: TSH: 5.08 u[IU]/mL — ABNORMAL HIGH (ref 0.35–4.50)

## 2019-03-19 LAB — COMPREHENSIVE METABOLIC PANEL
ALT: 15 U/L (ref 0–35)
AST: 20 U/L (ref 0–37)
Albumin: 4.5 g/dL (ref 3.5–5.2)
Alkaline Phosphatase: 60 U/L (ref 39–117)
BUN: 23 mg/dL (ref 6–23)
CO2: 33 mEq/L — ABNORMAL HIGH (ref 19–32)
Calcium: 9.5 mg/dL (ref 8.4–10.5)
Chloride: 101 mEq/L (ref 96–112)
Creatinine, Ser: 0.85 mg/dL (ref 0.40–1.20)
GFR: 64.79 mL/min (ref >60.00)
Glucose, Bld: 96 mg/dL (ref 70–99)
Potassium: 3.4 mEq/L — ABNORMAL LOW (ref 3.5–5.1)
Sodium: 141 mEq/L (ref 135–145)
Total Bilirubin: 0.7 mg/dL (ref 0.2–1.2)
Total Protein: 7 g/dL (ref 6.0–8.3)

## 2019-03-19 LAB — T4, FREE: Free T4: 0.78 ng/dL (ref 0.60–1.60)

## 2019-03-19 LAB — VITAMIN D 25 HYDROXY (VIT D DEFICIENCY, FRACTURES): VITD: 45.68 ng/mL (ref 30.00–100.00)

## 2019-03-28 ENCOUNTER — Other Ambulatory Visit: Payer: Self-pay

## 2019-03-28 ENCOUNTER — Encounter: Payer: Self-pay | Admitting: Family Medicine

## 2019-03-28 ENCOUNTER — Ambulatory Visit (INDEPENDENT_AMBULATORY_CARE_PROVIDER_SITE_OTHER): Payer: Medicare HMO | Admitting: Family Medicine

## 2019-03-28 VITALS — BP 122/76 | HR 58 | Temp 97.6°F | Ht 62.5 in | Wt 190.0 lb

## 2019-03-28 DIAGNOSIS — E039 Hypothyroidism, unspecified: Secondary | ICD-10-CM | POA: Diagnosis not present

## 2019-03-28 DIAGNOSIS — E7800 Pure hypercholesterolemia, unspecified: Secondary | ICD-10-CM

## 2019-03-28 DIAGNOSIS — E559 Vitamin D deficiency, unspecified: Secondary | ICD-10-CM

## 2019-03-28 DIAGNOSIS — E038 Other specified hypothyroidism: Secondary | ICD-10-CM

## 2019-03-28 DIAGNOSIS — I10 Essential (primary) hypertension: Secondary | ICD-10-CM

## 2019-03-28 DIAGNOSIS — E669 Obesity, unspecified: Secondary | ICD-10-CM | POA: Diagnosis not present

## 2019-03-28 DIAGNOSIS — M810 Age-related osteoporosis without current pathological fracture: Secondary | ICD-10-CM

## 2019-03-28 DIAGNOSIS — Z0000 Encounter for general adult medical examination without abnormal findings: Secondary | ICD-10-CM | POA: Diagnosis not present

## 2019-03-28 MED ORDER — HYDROCHLOROTHIAZIDE 25 MG PO TABS: 25.0000 mg | ORAL_TABLET | Freq: Every day | ORAL | 3 refills | Status: DC

## 2019-03-28 MED ORDER — BISOPROLOL-HYDROCHLOROTHIAZIDE 2.5-6.25 MG PO TABS: 1.0000 | ORAL_TABLET | Freq: Every day | ORAL | 3 refills | Status: DC

## 2019-03-28 MED ORDER — VERAPAMIL HCL ER 240 MG PO TBCR: 240.0000 mg | EXTENDED_RELEASE_TABLET | Freq: Every day | ORAL | 3 refills | Status: DC

## 2019-03-28 NOTE — Assessment & Plan Note (Signed)
Discussed how this problem influences overall health and the risks it imposes  Reviewed plan for weight loss with lower calorie diet (via better food choices and also portion control or program like weight watchers) and exercise building up to or more than 30 minutes 5 days per week including some aerobic activity    

## 2019-03-28 NOTE — Patient Instructions (Addendum)
We will sign you up for cologuard  Get a flu shot this fall   If you are interested in the new shingles vaccine (Shingrix) - call your local pharmacy to check on coverage and availability  If affordable, get on a wait list at your pharmacy to get the vaccine.  Think about indoor options for exercise  There are some walking programs (recordings) for the TV  Also - you can walk very early in the am   Let us know if you want an audiology referral for hearing   For cholesterol Avoid red meat/ fried foods/ egg yolks/ fatty breakfast meats/ butter, cheese and high fat dairy/ and shellfish   You qualify for cholesterol treatment - let us know if you want to treat high cholesterol   Get potassium from produce- deep leafy greens / oranges/ cantelope/bananas are all good sources

## 2019-03-28 NOTE — Assessment & Plan Note (Signed)
Lab Results  Component Value Date   TSH 5.08 (H) 03/19/2019   stable with FT4 in the normal range  Not symptomatic Will continue to monitor

## 2019-03-28 NOTE — Assessment & Plan Note (Signed)
Disc goals for lipids and reasons to control them Rev last labs with pt Rev low sat fat diet in detail  LDL remains high  She continues to decline tx  Enc less fat in diet

## 2019-03-28 NOTE — Assessment & Plan Note (Signed)
Vitamin D level is therapeutic with current supplementation Disc importance of this to bone and overall health Level in mid 40s in setting of OP

## 2019-03-28 NOTE — Assessment & Plan Note (Signed)
Reviewed health habits including diet and exercise and skin cancer prevention Reviewed appropriate screening tests for age  Also reviewed health mt list, fam hx and immunization status , as well as social and family history   See HPI Labs reviewed  cologuard test ordered Mammogram is scheduled for august  Disc shingrix vaccine  Pt has adv directive No cognitive concerns Hearing is down in higher tones-pt will let us know when she is ready for audiology ref

## 2019-03-28 NOTE — Assessment & Plan Note (Signed)
bp in fair control at this time  BP Readings from Last 1 Encounters:  03/28/19 122/76   No changes needed Most recent labs reviewed  Disc lifstyle change with low sodium diet and exercise  K is 3.4 Disc dietary sources of K -if this goes down further we will treat it

## 2019-03-28 NOTE — Progress Notes (Signed)
Subjective:    Patient ID: Monica Guerrero, female    DOB: 1941/08/20, 78 y.o.   MRN: [id number]  HPI  Here for amw and annual health mt exam as well as rev of chronic medical conditions  I have personally reviewed the Medicare Annual Wellness questionnaire and have noted 1. The patient's medical and social history 2. Their use of alcohol, tobacco or illicit drugs 3. Their current medications and supplements 4. The patient's functional ability including ADL's, fall risks, home safety risks and hearing or visual             impairment. 5. Diet and physical activities 6. Evidence for depression or mood disorders  The patients weight, height, BMI have been recorded in the chart and visual acuity is per eye clinic.  I have made referrals, counseling and provided education to the patient based review of the above and I have provided the pt with a written personalized care plan for preventive services. Reviewed and updated provider list, see scanned forms.  See scanned forms.  Routine anticipatory guidance given to patient.  See health maintenance. Colon cancer screening colonoscopy 7/11 - wants to do cologuard again  Breast cancer screening -mammogram 6/19 (scheduled for 8/20) Self breast exam-no lumps or changes  Flu vaccine 10/19 Tetanus vaccine Td 12/13 Pneumovax completed Zoster vaccine-had zostavax  dexa 6/19 -OP slt worse  Has had a course of evista- declines more therapy  Falls-none  Fractures-none Supplements-ca and D  D level is 45.6 Advance directive- she has living will and POA (daughters)  Cognitive function addressed- see scanned forms- and if abnormal then additional documentation follows.  No worries  Notes that she occ forgets things  No confusion  Not getting lost  Family will tell her if they notice anything   PMH and SH reviewed  Meds, vitals, and allergies reviewed.   ROS: See HPI.  Otherwise negative.    Weight  Wt Readings from Last 3 Encounters:    03/28/19 190 lb (86.2 kg)  02/01/19 185 lb (83.9 kg)  01/02/18 187 lb 8 oz (85 kg)  up 5 lb  She does not weigh at home  No exercise  Eats healthy except for ice cream  34.20 kg/m   She likes to walk - but it has been too hot  She is drinking enough fluids    Vision/hearing   Hearing Screening   125Hz  250Hz  500Hz  1000Hz  2000Hz  3000Hz  4000Hz  6000Hz  8000Hz   Right ear:   40 40 0  0    Left ear:   40 40 0  0    Vision Screening Comments: Pt had eye exam in May 2020 with Dr. Frederik Pear she is beginning to notice some hearing problems  Background noise is hard / some voices also  She is thinking about a hearing aide No vision changes    Hypertension  bp is stable today  No cp or palpitations or headaches or edema  No side effects to medicines  BP Readings from Last 3 Encounters:  03/28/19 122/76  02/20/19 131/67  02/01/19 (!) 153/68        Subclinical hypothyroid  TSH is slt improved  Lab Results  Component Value Date   TSH 5.08 (H) 03/19/2019   free T4 is 0.78 Not tired or sluggish more than usual  Not as much energy as when she was younger    Hyperlipidemia Lab Results  Component Value Date   CHOL 233 (H) 03/19/2019   CHOL 220 (H)  12/28/2017   CHOL 245 (H) 12/22/2016   Lab Results  Component Value Date   HDL 62.30 03/19/2019   HDL 62.00 12/28/2017   HDL 68.80 12/22/2016   Lab Results  Component Value Date   LDLCALC 154 (H) 03/19/2019   LDLCALC 140 (H) 12/28/2017   LDLCALC 153 (H) 12/22/2016   Lab Results  Component Value Date   TRIG 84.0 03/19/2019   TRIG 88.0 12/28/2017   TRIG 118.0 12/22/2016   Lab Results  Component Value Date   CHOLHDL 4 03/19/2019   CHOLHDL 4 12/28/2017   CHOLHDL 4 12/22/2016   Lab Results  Component Value Date   LDLDIRECT 175.7 09/09/2013   LDLDIRECT 184.5 08/01/2012   LDLDIRECT 169.6 10/20/2011  she declines tx  LDL is high  Needs to stop ice cream  No red meat or fried foods   Lab Results  Component Value  Date   CREATININE 0.85 03/19/2019   BUN 23 03/19/2019   NA 141 03/19/2019   K 3.4 (L) 03/19/2019   CL 101 03/19/2019   CO2 33 (H) 03/19/2019   Lab Results  Component Value Date   ALT 15 03/19/2019   AST 20 03/19/2019   ALKPHOS 60 03/19/2019   BILITOT 0.7 03/19/2019    Lab Results  Component Value Date   WBC 5.6 03/19/2019   HGB 14.3 03/19/2019   HCT 42.7 03/19/2019   MCV 88.9 03/19/2019   PLT 224.0 03/19/2019    K is slightly low   Patient Active Problem List   Diagnosis Date Noted   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   Obesity (BMI 30-39.9) 12/27/2016   Routine general medical examination at a health care facility 12/08/2015   GERD (gastroesophageal reflux disease) 12/08/2015   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   Estrogen deficiency 10/31/2014   Encounter for screening mammogram for breast cancer 10/31/2014   Vitamin D deficiency 10/31/2014   Shoulder pain, right 10/22/2014   Skin cancer screening 09/23/2013   Encounter for Medicare annual wellness exam 09/13/2013   Screening mammogram, encounter for 04/19/2011   IRRITABLE BOWEL SYNDROME 01/22/2010   HYPERCHOLESTEROLEMIA 10/03/2007   GLAUCOMA 10/03/2007   Essential hypertension 10/03/2007   ALLERGIC RHINITIS 10/03/2007   OSTEOARTHRITIS 10/03/2007   Osteoporosis 10/03/2007   Past Medical History:  Diagnosis Date   Allergy    Arthritis    Osteoarthritis   Cancer ([place])    skin   Hypertension    Osteoporosis    Past Surgical History:  Procedure Laterality Date   BASAL CELL CARCINOMA EXCISION  12/2016   neck   EYE SURGERY     Bilateral cataract Surgery   SQUAMOUS CELL CARCINOMA EXCISION  12/2016   neck   TONSILLECTOMY     Social History   Tobacco Use   Smoking status: Never Smoker   Smokeless tobacco: Never Used  Substance Use Topics   Alcohol use: No    Alcohol/week: 0.0 standard drinks   Drug use: No   Family History  Problem Relation Age of Onset    Cancer Father        Kidney   Heart disease Father    Cancer Paternal Grandmother    Dementia Mother    Hypertension Brother    Heart disease Maternal Uncle    Stroke Maternal Grandmother    Heart disease Maternal Grandfather    No Known Allergies Current Outpatient Medications on File Prior to Visit  Medication Sig Dispense Refill   acetaminophen (TYLENOL ARTHRITIS PAIN) 650 MG CR tablet  Take 650 mg by mouth every 8 (eight) hours as needed. Alternates with Ibuprofen.      aspirin 81 MG tablet Take 81 mg by mouth daily.       calcium carbonate (OS-CAL) 600 MG TABS Take 600 mg by mouth daily.      chlorpheniramine (CHLOR-TRIMETON) 4 MG tablet Take 4 mg by mouth 2 (two) times daily as needed.       cholecalciferol (VITAMIN D) 1000 UNITS tablet Take 2,000 Units by mouth daily.     GARLIC PO Take 1 capsule by mouth daily.     Lifitegrast (XIIDRA) 5 % SOLN Place 1 drop into both eyes 2 (two) times a day.     loratadine (CLARITIN) 10 MG tablet Take 10 mg by mouth as directed.       Multiple Vitamins-Minerals (PRESERVISION AREDS 2) CAPS Take 1 capsule by mouth 2 (two) times a day.     phenylephrine (NEO-SYNEPHRINE) 0.125 % nasal drops Place 1 drop into the nose as needed.     Polyethyl Glycol-Propyl Glycol (SYSTANE OP) Apply to eye 2 (two) times daily as needed.     timolol (BETIMOL) 0.5 % ophthalmic solution Place 1 drop into both eyes daily.      No current facility-administered medications on file prior to visit.     Review of Systems  Constitutional: Negative for activity change, appetite change, fatigue, fever and unexpected weight change.  HENT: Negative for congestion, ear pain, rhinorrhea, sinus pressure and sore throat.   Eyes: Negative for pain, redness and visual disturbance.  Respiratory: Negative for cough, shortness of breath and wheezing.   Cardiovascular: Negative for chest pain and palpitations.  Gastrointestinal: Negative for abdominal pain, blood in  stool, constipation and diarrhea.  Endocrine: Negative for polydipsia and polyuria.  Genitourinary: Negative for dysuria, frequency and urgency.  Musculoskeletal: Negative for arthralgias, back pain and myalgias.  Skin: Negative for pallor and rash.  Allergic/Immunologic: Negative for environmental allergies.  Neurological: Negative for dizziness, syncope and headaches.  Hematological: Negative for adenopathy. Does not bruise/bleed easily.  Psychiatric/Behavioral: Negative for decreased concentration and dysphoric mood. The patient is not nervous/anxious.        Objective:   Physical Exam Constitutional:      General: She is not in acute distress.    Appearance: Normal appearance. She is well-developed. She is obese. She is not ill-appearing.  HENT:     Head: Normocephalic and atraumatic.     Right Ear: Tympanic membrane, ear canal and external ear normal.     Left Ear: Tympanic membrane, ear canal and external ear normal.     Nose: Nose normal.     Mouth/Throat:     Mouth: Mucous membranes are moist.     Pharynx: Oropharynx is clear. No posterior oropharyngeal erythema.  Eyes:     General: No scleral icterus.    Conjunctiva/sclera: Conjunctivae normal.     Pupils: Pupils are equal, round, and reactive to light.  Neck:     Musculoskeletal: Normal range of motion and neck supple. No neck rigidity or muscular tenderness.     Thyroid: No thyromegaly.     Vascular: No carotid bruit or JVD.  Cardiovascular:     Rate and Rhythm: Regular rhythm. Bradycardia present.     Pulses: Normal pulses.     Heart sounds: Normal heart sounds. No gallop.   Pulmonary:     Effort: Pulmonary effort is normal. No respiratory distress.     Breath sounds: Normal breath sounds.  No wheezing.     Comments: Good air exch Chest:     Chest wall: No tenderness.  Abdominal:     General: Bowel sounds are normal. There is no distension or abdominal bruit.     Palpations: Abdomen is soft. There is no mass.       Tenderness: There is no abdominal tenderness.     Hernia: No hernia is present.  Musculoskeletal: Normal range of motion.        General: No tenderness.     Right lower leg: No edema.     Left lower leg: No edema.     Comments: No kyphosis   Lymphadenopathy:     Cervical: No cervical adenopathy.  Skin:    General: Skin is warm and dry.     Coloration: Skin is not pale.     Findings: No erythema or rash.     Comments: Solar lentigines diffusely   Neurological:     Mental Status: She is alert.     Cranial Nerves: No cranial nerve deficit.     Motor: No weakness or abnormal muscle tone.     Coordination: Coordination normal.     Gait: Gait normal.     Deep Tendon Reflexes: Reflexes are normal and symmetric. Reflexes normal.  Psychiatric:        Mood and Affect: Mood normal.           Assessment & Plan:   Problem List Items Addressed This Visit      Cardiovascular and Mediastinum   Essential hypertension    bp in fair control at this time  BP Readings from Last 1 Encounters:  03/28/19 122/76   No changes needed Most recent labs reviewed  Disc lifstyle change with low sodium diet and exercise  K is 3.4 Disc dietary sources of K -if this goes down further we will treat it      Relevant Medications   bisoprolol-hydrochlorothiazide (ZIAC) 2.5-6.25 MG tablet   hydrochlorothiazide (HYDRODIURIL) 25 MG tablet   verapamil (CALAN-SR) 240 MG CR tablet     Endocrine   Subclinical hypothyroidism - Primary    Lab Results  Component Value Date   TSH 5.08 (H) 03/19/2019   stable with FT4 in the normal range  Not symptomatic Will continue to monitor         Musculoskeletal and Integument   Osteoporosis    dexa 6/19  Has finished course of evista No falls or fractures  D level is therapeutic  She declines further tx        Other   HYPERCHOLESTEROLEMIA    Disc goals for lipids and reasons to control them Rev last labs with pt Rev low sat fat diet in detail   LDL remains high  She continues to decline tx  Enc less fat in diet      Relevant Medications   bisoprolol-hydrochlorothiazide (ZIAC) 2.5-6.25 MG tablet   hydrochlorothiazide (HYDRODIURIL) 25 MG tablet   verapamil (CALAN-SR) 240 MG CR tablet   Encounter for Medicare annual wellness exam    Reviewed health habits including diet and exercise and skin cancer prevention Reviewed appropriate screening tests for age  Also reviewed health mt list, fam hx and immunization status , as well as social and family history   See HPI Labs reviewed  cologuard test ordered Mammogram is scheduled for august  Disc shingrix vaccine  Pt has adv directive No cognitive concerns Hearing is down in higher tones-pt will let us know when  she is ready for audiology ref        Vitamin D deficiency    Vitamin D level is therapeutic with current supplementation Disc importance of this to bone and overall health Level in mid 40s in setting of OP      Routine general medical examination at a health care facility    Reviewed health habits including diet and exercise and skin cancer prevention Reviewed appropriate screening tests for age  Also reviewed health mt list, fam hx and immunization status , as well as social and family history   See HPI Labs reviewed  cologuard test ordered Mammogram is scheduled for august  Disc shingrix vaccine  Pt has adv directive No cognitive concerns Hearing is down in higher tones-pt will let us know when she is ready for audiology ref       Obesity (BMI 30-39.9)    .Discussed how this problem influences overall health and the risks it imposes  Reviewed plan for weight loss with lower calorie diet (via better food choices and also portion control or program like weight watchers) and exercise building up to or more than 30 minutes 5 days per week including some aerobic activity

## 2019-03-28 NOTE — Assessment & Plan Note (Signed)
dexa 6/19  Has finished course of evista No falls or fractures  D level is therapeutic  She declines further tx

## 2019-04-17 ENCOUNTER — Encounter: Payer: Self-pay | Admitting: Family Medicine

## 2019-04-17 DIAGNOSIS — Z1211 Encounter for screening for malignant neoplasm of colon: Secondary | ICD-10-CM | POA: Diagnosis not present

## 2019-04-17 DIAGNOSIS — Z1212 Encounter for screening for malignant neoplasm of rectum: Secondary | ICD-10-CM | POA: Diagnosis not present

## 2019-04-25 ENCOUNTER — Ambulatory Visit
Admission: RE | Admit: 2019-04-25 | Discharge: 2019-04-25 | Disposition: A | Discharge status: Home / Self Care | Payer: Medicare HMO | Source: Ambulatory Visit | Attending: Family Medicine | Admitting: Family Medicine

## 2019-04-25 ENCOUNTER — Other Ambulatory Visit: Payer: Self-pay

## 2019-04-25 DIAGNOSIS — Z1231 Encounter for screening mammogram for malignant neoplasm of breast: Secondary | ICD-10-CM | POA: Diagnosis not present

## 2019-04-25 LAB — COLOGUARD

## 2019-05-30 ENCOUNTER — Ambulatory Visit (INDEPENDENT_AMBULATORY_CARE_PROVIDER_SITE_OTHER): Payer: Medicare HMO

## 2019-05-30 DIAGNOSIS — Z23 Encounter for immunization: Secondary | ICD-10-CM

## 2019-07-19 ENCOUNTER — Other Ambulatory Visit: Payer: Self-pay

## 2019-07-19 DIAGNOSIS — Z20822 Contact with and (suspected) exposure to covid-19: Secondary | ICD-10-CM

## 2019-07-19 DIAGNOSIS — Z20828 Contact with and (suspected) exposure to other viral communicable diseases: Secondary | ICD-10-CM | POA: Diagnosis not present

## 2019-07-21 LAB — NOVEL CORONAVIRUS, NAA: SARS-CoV-2, NAA: NOT DETECTED

## 2019-07-22 ENCOUNTER — Telehealth: Payer: Self-pay | Admitting: *Deleted

## 2019-07-22 NOTE — Telephone Encounter (Signed)
Patient called states she was calling this number back ,she has received 2 calls .She also shates she viewed her results for COVID on my chart already.

## 2019-07-29 DIAGNOSIS — H401131 Primary open-angle glaucoma, bilateral, mild stage: Secondary | ICD-10-CM | POA: Diagnosis not present

## 2019-08-05 DIAGNOSIS — H401131 Primary open-angle glaucoma, bilateral, mild stage: Secondary | ICD-10-CM | POA: Diagnosis not present

## 2019-08-06 ENCOUNTER — Encounter: Payer: Self-pay | Admitting: Family Medicine

## 2019-08-07 ENCOUNTER — Other Ambulatory Visit: Payer: Self-pay

## 2019-08-07 ENCOUNTER — Ambulatory Visit (INDEPENDENT_AMBULATORY_CARE_PROVIDER_SITE_OTHER): Payer: Medicare HMO | Admitting: Family Medicine

## 2019-08-07 ENCOUNTER — Encounter: Payer: Self-pay | Admitting: Family Medicine

## 2019-08-07 VITALS — BP 136/80 | HR 55 | Temp 97.9°F | Ht 62.5 in | Wt 187.0 lb

## 2019-08-07 DIAGNOSIS — E038 Other specified hypothyroidism: Secondary | ICD-10-CM

## 2019-08-07 DIAGNOSIS — E039 Hypothyroidism, unspecified: Secondary | ICD-10-CM

## 2019-08-07 DIAGNOSIS — R002 Palpitations: Secondary | ICD-10-CM

## 2019-08-07 DIAGNOSIS — I10 Essential (primary) hypertension: Secondary | ICD-10-CM

## 2019-08-07 DIAGNOSIS — R000 Tachycardia, unspecified: Secondary | ICD-10-CM

## 2019-08-07 DIAGNOSIS — E669 Obesity, unspecified: Secondary | ICD-10-CM

## 2019-08-07 NOTE — Progress Notes (Signed)
Subjective:    Patient ID: Monica Guerrero, female    DOB: Aug 14, 1941, 78 y.o.   MRN: [id number]  HPI Pt presents with c/o erratic pulse  Noted in the evenings -feels like she skips beats with 3-4 fast beats in between   Started to notice this on Sunday  Around 5 pm- was sitting and watching tv or reading  Most of her symptoms -notices in the evenings  Lasts usually a few hours  Not into the night  Sleeping fine    No chest discomfort No sob   Years ago -was put on verapamil for ? SVT    (found out it may have correlated with her periods/hormone changes)  Pulse would go up to 170 bpm That is when she was placed on verapamil    Takes verapamil SR 240 mg daily - takes in evening  hctz 25 mg daily -in am Bisoprolol -hct 2.5-6.25-in am  bp has been on the high side lately at home  (some [id number] systolic)  Once diastolic was in 0000000    Wt Readings from Last 3 Encounters:  08/07/19 187 lb (84.8 kg)  03/28/19 190 lb (86.2 kg)  02/01/19 185 lb (83.9 kg)   33.66 kg/m   EKG today -sinus bradycardia rate of 55   BP Readings from Last 3 Encounters:  08/07/19 136/80  03/28/19 122/76  02/20/19 131/67   Pulse Readings from Last 3 Encounters:  08/07/19 (!) 55  03/28/19 (!) 58  02/01/19 (!) 57   Stressors - her daughter has had covid - and being treated for pneumonia at home  Caffeine intake - no more than 1 cup of coffee  Used to drink a lot more   Patient Active Problem List   Diagnosis Date Noted  . 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 Obesity (BMI 30-39.9) 12/27/2016  . Routine general medical examination at a health care facility 12/08/2015  . GERD (gastroesophageal reflux disease) 12/08/2015  . 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 Estrogen deficiency 10/31/2014  . Encounter for screening mammogram for breast cancer 10/31/2014  . Vitamin D deficiency 10/31/2014  . Shoulder pain, right 10/22/2014  . Skin cancer screening 09/23/2013  . Encounter for  Medicare annual wellness exam 09/13/2013  . Screening mammogram, encounter for 04/19/2011  . IRRITABLE BOWEL SYNDROME 01/22/2010  . Palpitations 12/18/2008  . HYPERCHOLESTEROLEMIA 10/03/2007  . GLAUCOMA 10/03/2007  . Essential hypertension 10/03/2007  . ALLERGIC RHINITIS 10/03/2007  . OSTEOARTHRITIS 10/03/2007  . Osteoporosis 10/03/2007   Past Medical History:  Diagnosis Date  . Allergy   . Arthritis    Osteoarthritis  . Cancer ([place])    skin  . Hypertension   . Osteoporosis    Past Surgical History:  Procedure Laterality Date  . BASAL CELL CARCINOMA EXCISION  12/2016   neck  . EYE SURGERY     Bilateral cataract Surgery  . SQUAMOUS CELL CARCINOMA EXCISION  12/2016   neck  . TONSILLECTOMY     Social History   Tobacco Use  . Smoking status: Never Smoker  . Smokeless tobacco: Never Used  Substance Use Topics  . Alcohol use: No    Alcohol/week: 0.0 standard drinks  . Drug use: No   Family History  Problem Relation Age of Onset  . Cancer Father        Kidney  . Heart disease Father   . Cancer Paternal Grandmother   . Dementia Mother   . Hypertension Brother   . Heart disease Maternal Uncle   .  Stroke Maternal Grandmother   . Heart disease Maternal Grandfather   . Breast cancer Neg Hx    No Known Allergies Current Outpatient Medications on File Prior to Visit  Medication Sig Dispense Refill  . acetaminophen (TYLENOL ARTHRITIS PAIN) 650 MG CR tablet Take 650 mg by mouth every 8 (eight) hours as needed. Alternates with Ibuprofen.     Marland Kitchen aspirin 81 MG tablet Take 81 mg by mouth daily.      . bisoprolol-hydrochlorothiazide (ZIAC) 2.5-6.25 MG tablet Take 1 tablet by mouth daily. 90 tablet 3  . calcium carbonate (OS-CAL) 600 MG TABS Take 600 mg by mouth daily.     . chlorpheniramine (CHLOR-TRIMETON) 4 MG tablet Take 4 mg by mouth 2 (two) times daily as needed.      . cholecalciferol (VITAMIN D) 1000 UNITS tablet Take 2,000 Units by mouth daily.    Marland Kitchen GARLIC PO Take 1  capsule by mouth daily.    . hydrochlorothiazide (HYDRODIURIL) 25 MG tablet Take 1 tablet (25 mg total) by mouth daily. 90 tablet 3  . Lifitegrast (XIIDRA) 5 % SOLN Place 1 drop into both eyes 2 (two) times a day.    . loratadine (CLARITIN) 10 MG tablet Take 10 mg by mouth as directed.      . Multiple Vitamins-Minerals (PRESERVISION AREDS 2) CAPS Take 1 capsule by mouth 2 (two) times a day.    . phenylephrine (NEO-SYNEPHRINE) 0.125 % nasal drops Place 1 drop into the nose as needed.    Vladimir Faster Glycol-Propyl Glycol (SYSTANE OP) Apply to eye 2 (two) times daily as needed.    . timolol (BETIMOL) 0.5 % ophthalmic solution Place 1 drop into both eyes daily.     . verapamil (CALAN-SR) 240 MG CR tablet Take 1 tablet (240 mg total) by mouth at bedtime. 90 tablet 3   No current facility-administered medications on file prior to visit.     Review of Systems  Constitutional: Negative for activity change, appetite change, fatigue, fever and unexpected weight change.  HENT: Negative for congestion, ear pain, rhinorrhea, sinus pressure and sore throat.   Eyes: Negative for pain, redness and visual disturbance.  Respiratory: Negative for cough, shortness of breath and wheezing.   Cardiovascular: Positive for palpitations. Negative for chest pain and leg swelling.  Gastrointestinal: Negative for abdominal pain, blood in stool, constipation and diarrhea.  Endocrine: Negative for polydipsia and polyuria.  Genitourinary: Negative for dysuria, frequency and urgency.  Musculoskeletal: Negative for arthralgias, back pain and myalgias.  Skin: Negative for pallor and rash.  Allergic/Immunologic: Negative for environmental allergies.  Neurological: Negative for dizziness, syncope and headaches.  Hematological: Negative for adenopathy. Does not bruise/bleed easily.  Psychiatric/Behavioral: Negative for decreased concentration and dysphoric mood. The patient is not nervous/anxious.        Some stressors         Objective:   Physical Exam Constitutional:      General: She is not in acute distress.    Appearance: Normal appearance. She is well-developed. She is obese. She is not ill-appearing or diaphoretic.  HENT:     Head: Normocephalic and atraumatic.     Mouth/Throat:     Mouth: Mucous membranes are moist.  Eyes:     Conjunctiva/sclera: Conjunctivae normal.     Pupils: Pupils are equal, round, and reactive to light.  Neck:     Musculoskeletal: Normal range of motion and neck supple.     Thyroid: No thyromegaly.     Vascular: No carotid  bruit or JVD.  Cardiovascular:     Rate and Rhythm: Regular rhythm. Bradycardia present.     Pulses: Normal pulses.     Heart sounds: Normal heart sounds. No murmur. No gallop.   Pulmonary:     Effort: Pulmonary effort is normal. No respiratory distress.     Breath sounds: Normal breath sounds. No wheezing or rales.  Abdominal:     General: Bowel sounds are normal. There is no distension or abdominal bruit.     Palpations: Abdomen is soft. There is no mass.     Tenderness: There is no abdominal tenderness.  Musculoskeletal:     Right lower leg: No edema.     Left lower leg: No edema.  Lymphadenopathy:     Cervical: No cervical adenopathy.  Skin:    General: Skin is warm and dry.     Findings: No rash.  Neurological:     Mental Status: She is alert.     Motor: No weakness or tremor.     Coordination: Coordination normal.     Deep Tendon Reflexes: Reflexes are normal and symmetric.  Psychiatric:        Mood and Affect: Mood normal.           Assessment & Plan:   Problem List Items Addressed This Visit      Cardiovascular and Mediastinum   Essential hypertension    BP: 136/80  bp has stayed in the nl range here- higher at times at home  Taking verapamil, hct and bisporplol-hct  (pre dates this clinic) Disc DASH eating/healthy habits       Relevant Orders   CBC w/Diff   Comprehensive metabolic panel   TSH     Endocrine    Subclinical hypothyroidism    Pt has experienced more evening palpitations recently  TSH drawn today      Relevant Orders   TSH     Other   Palpitations - Primary    Pt experiences these in the evening just before dosing her verapamil (per hx sounds like PVCs or PACs)  Of note-pt has remote hx of SVT- for which she was px verapamil and beta blocker  Baseline pulse in 50s / EKG is wnl today  Adv to stop caffeine  Labs today incl cbc/tsh Will likely ref to cardiology for further eval/likely monitor  inst to alert us/seek care if symptoms suddenly worsen       Relevant Orders   CBC w/Diff   Comprehensive metabolic panel   TSH   Obesity (BMI 30-39.9)    Discussed how this problem influences overall health and the risks it imposes  Reviewed plan for weight loss with lower calorie diet (via better food choices and also portion control or program like weight watchers) and exercise building up to or more than 30 minutes 5 days per week including some aerobic activity          Other Visit Diagnoses    Tachycardia       Relevant Orders   EKG 12-Lead (Completed)

## 2019-08-07 NOTE — Assessment & Plan Note (Signed)
BP: 136/80  bp has stayed in the nl range here- higher at times at home  Taking verapamil, hct and bisporplol-hct  (pre dates this clinic) Disc DASH eating/healthy habits

## 2019-08-07 NOTE — Telephone Encounter (Signed)
appt scheduled today at 4pm, will do EKG on arrival

## 2019-08-07 NOTE — Patient Instructions (Addendum)
Stop caffeine for now   Labs today for chemistries and blood count and thyroid   If all is normal-will consider cardiology opinion/monitor

## 2019-08-07 NOTE — Assessment & Plan Note (Signed)
Pt experiences these in the evening just before dosing her verapamil (per hx sounds like PVCs or PACs)  Of note-pt has remote hx of SVT- for which she was px verapamil and beta blocker  Baseline pulse in 50s / EKG is wnl today  Adv to stop caffeine  Labs today incl cbc/tsh Will likely ref to cardiology for further eval/likely monitor  inst to alert us/seek care if symptoms suddenly worsen

## 2019-08-07 NOTE — Assessment & Plan Note (Signed)
Pt has experienced more evening palpitations recently  TSH drawn today

## 2019-08-07 NOTE — Assessment & Plan Note (Signed)
Discussed how this problem influences overall health and the risks it imposes  Reviewed plan for weight loss with lower calorie diet (via better food choices and also portion control or program like weight watchers) and exercise building up to or more than 30 minutes 5 days per week including some aerobic activity    

## 2019-08-08 LAB — COMPREHENSIVE METABOLIC PANEL
ALT: 15 U/L (ref 0–35)
AST: 19 U/L (ref 0–37)
Albumin: 4.5 g/dL (ref 3.5–5.2)
Alkaline Phosphatase: 59 U/L (ref 39–117)
BUN: 24 mg/dL — ABNORMAL HIGH (ref 6–23)
CO2: 34 mEq/L — ABNORMAL HIGH (ref 19–32)
Calcium: 10.2 mg/dL (ref 8.4–10.5)
Chloride: 100 mEq/L (ref 96–112)
Creatinine, Ser: 0.86 mg/dL (ref 0.40–1.20)
GFR: 63.85 mL/min (ref >60.00)
Glucose, Bld: 94 mg/dL (ref 70–99)
Potassium: 3.6 mEq/L (ref 3.5–5.1)
Sodium: 141 mEq/L (ref 135–145)
Total Bilirubin: 0.4 mg/dL (ref 0.2–1.2)
Total Protein: 7 g/dL (ref 6.0–8.3)

## 2019-08-08 LAB — TSH: TSH: 2.56 u[IU]/mL (ref 0.35–4.50)

## 2019-08-08 LAB — CBC WITH DIFFERENTIAL/PLATELET
Basophils Absolute: 0.1 10*3/uL (ref 0.0–0.1)
Basophils Relative: 1.2 % (ref 0.0–3.0)
Eosinophils Absolute: 0.1 10*3/uL (ref 0.0–0.7)
Eosinophils Relative: 1.6 % (ref 0.0–5.0)
HCT: 42.1 % (ref 36.0–46.0)
Hemoglobin: 14.1 g/dL (ref 12.0–15.0)
Lymphocytes Relative: 25.2 % (ref 12.0–46.0)
Lymphs Abs: 1.8 10*3/uL (ref 0.7–4.0)
MCHC: 33.4 g/dL (ref 30.0–36.0)
MCV: 89.8 fl (ref 78.0–100.0)
Monocytes Absolute: 0.7 10*3/uL (ref 0.1–1.0)
Monocytes Relative: 9.4 % (ref 3.0–12.0)
Neutro Abs: 4.4 10*3/uL (ref 1.4–7.7)
Neutrophils Relative %: 62.6 % (ref 43.0–77.0)
Platelets: 240 10*3/uL (ref 150.0–400.0)
RBC: 4.69 Mil/uL (ref 3.87–5.11)
RDW: 13.5 % (ref 11.5–15.5)
WBC: 7 10*3/uL (ref 4.0–10.5)

## 2019-08-09 ENCOUNTER — Telehealth: Payer: Self-pay | Admitting: *Deleted

## 2019-08-09 ENCOUNTER — Telehealth: Payer: Self-pay | Admitting: Family Medicine

## 2019-08-09 DIAGNOSIS — R002 Palpitations: Secondary | ICD-10-CM

## 2019-08-09 NOTE — Telephone Encounter (Signed)
-----   Message from Tammi Sou, [place] sent at 08/09/2019 12:58 PM EST ----- Pt viewed result on mychart and she does agree with a referral to cardiology, I advise pt our [facility] will call to schedule appt

## 2019-08-09 NOTE — Telephone Encounter (Signed)
Addressed through result notes, pt called back

## 2019-08-09 NOTE — Telephone Encounter (Signed)
Referral done  Will send to [facility] 

## 2019-08-09 NOTE — Telephone Encounter (Signed)
Left VM requesting pt to call the office back regarding lab results  

## 2019-08-19 ENCOUNTER — Other Ambulatory Visit: Payer: Self-pay

## 2019-08-19 ENCOUNTER — Encounter: Payer: Self-pay | Admitting: Cardiology

## 2019-08-19 ENCOUNTER — Ambulatory Visit (INDEPENDENT_AMBULATORY_CARE_PROVIDER_SITE_OTHER): Payer: Medicare HMO

## 2019-08-19 ENCOUNTER — Ambulatory Visit (INDEPENDENT_AMBULATORY_CARE_PROVIDER_SITE_OTHER): Payer: Medicare HMO | Admitting: Cardiology

## 2019-08-19 VITALS — BP 143/77 | HR 59 | Temp 96.8°F | Ht 63.0 in | Wt 187.0 lb

## 2019-08-19 DIAGNOSIS — I10 Essential (primary) hypertension: Secondary | ICD-10-CM

## 2019-08-19 DIAGNOSIS — I499 Cardiac arrhythmia, unspecified: Secondary | ICD-10-CM

## 2019-08-19 NOTE — Progress Notes (Signed)
Cardiology Office Note:    Date:  08/19/2019   ID:  Monica Guerrero, DOB 1941/08/06, MRN [id number]  PCP:  Abner Greenspan, MD  Cardiologist:  No primary care provider on file.  Electrophysiologist:  None   Referring MD: Abner Greenspan, MD   Chief Complaint  Patient presents with  . New Patient (Initial Visit)    Ref by Dr. Loura Pardon for evaluation of palpitations. Meds reviewed by the patient verbally. Pt. c/o a Hx. of SVT from 25 years ago with being put on Verapamil and recently has noticed some irreg. rapid heart beats with occas. palpitations and elevated BP.     History of Present Illness:    Monica Guerrero is a 78 y.o. female with a hx of hypertension, SVT who presents with palpitations.  Patient states noting symptoms of irregular/skipped heartbeat about 2 weeks ago.  These are because daily lasting a few seconds and then goes away.  She denies palpitations, dizziness, chest pain, shortness of breath, edema, syncope.  She states having a history of SVT diagnosed about 25 years ago with her heart rates going up to 170s.  She was given a Holter and that is how she was diagnosed.  She was placed on verapamil which suppress the symptoms.  Since the onset of the irregular heartbeats, she has noticed her blood pressure increased slightly.  Her blood pressure range at home is from [id number] to [id number] systolic.  Past Medical History:  Diagnosis Date  . Allergy   . Arthritis    Osteoarthritis  . Cancer ([place])    skin  . Hypertension   . Osteoporosis     Past Surgical History:  Procedure Laterality Date  . BASAL CELL CARCINOMA EXCISION  12/2016   neck  . EYE SURGERY     Bilateral cataract Surgery  . SQUAMOUS CELL CARCINOMA EXCISION  12/2016   neck  . TONSILLECTOMY      Current Medications: Current Meds  Medication Sig  . acetaminophen (TYLENOL ARTHRITIS PAIN) 650 MG CR tablet Take 650 mg by mouth every 8 (eight) hours as needed. Alternates with Ibuprofen.   Marland Kitchen aspirin 81 MG tablet  Take 81 mg by mouth daily.    . bisoprolol-hydrochlorothiazide (ZIAC) 2.5-6.25 MG tablet Take 1 tablet by mouth daily.  . calcium carbonate (OS-CAL) 600 MG TABS Take 600 mg by mouth daily.   . chlorpheniramine (CHLOR-TRIMETON) 4 MG tablet Take 4 mg by mouth 2 (two) times daily as needed.    . cholecalciferol (VITAMIN D) 1000 UNITS tablet Take 2,000 Units by mouth daily.  Marland Kitchen GARLIC PO Take 1 capsule by mouth daily.  . hydrochlorothiazide (HYDRODIURIL) 25 MG tablet Take 1 tablet (25 mg total) by mouth daily.  Marland Kitchen Lifitegrast (XIIDRA) 5 % SOLN Place 1 drop into both eyes 2 (two) times a day.  . loratadine (CLARITIN) 10 MG tablet Take 10 mg by mouth as directed.    . Multiple Vitamins-Minerals (PRESERVISION AREDS 2) CAPS Take 1 capsule by mouth 2 (two) times a day.  . phenylephrine (NEO-SYNEPHRINE) 0.125 % nasal drops Place 1 drop into the nose as needed.  Vladimir Faster Glycol-Propyl Glycol (SYSTANE OP) Apply to eye 2 (two) times daily as needed.  . timolol (BETIMOL) 0.5 % ophthalmic solution Place 1 drop into both eyes daily.   . verapamil (CALAN-SR) 240 MG CR tablet Take 1 tablet (240 mg total) by mouth at bedtime.     Allergies:   Patient has no known allergies.  Social History   Socioeconomic History  . Marital status: Single    Spouse name: Not on file  . Number of children: 2  . Years of education: Not on file  . Highest education level: Not on file  Occupational History  . Occupation: Bank Teller  Tobacco Use  . Smoking status: Never Smoker  . Smokeless tobacco: Never Used  Substance and Sexual Activity  . Alcohol use: No    Alcohol/week: 0.0 standard drinks  . Drug use: No  . Sexual activity: Not Currently  Other Topics Concern  . Not on file  Social History Narrative  . Not on file   Social Determinants of Health   Financial Resource Strain:   . Difficulty of Paying Living Expenses: Not on file  Food Insecurity:   . Worried About Charity fundraiser in the Last Year:  Not on file  . Ran Out of Food in the Last Year: Not on file  Transportation Needs:   . Lack of Transportation (Medical): Not on file  . Lack of Transportation (Non-Medical): Not on file  Physical Activity:   . Days of Exercise per Week: Not on file  . Minutes of Exercise per Session: Not on file  Stress:   . Feeling of Stress : Not on file  Social Connections:   . Frequency of Communication with Friends and Family: Not on file  . Frequency of Social Gatherings with Friends and Family: Not on file  . Attends Religious Services: Not on file  . Active Member of Clubs or Organizations: Not on file  . Attends Archivist Meetings: Not on file  . Marital Status: Not on file     Family History: The patient's family history includes Cancer in her father and paternal grandmother; Dementia in her mother; Heart disease in her father, maternal grandfather, and maternal uncle; Hypertension in her brother; Stroke in her maternal grandmother. There is no history of Breast cancer.  ROS:   Please see the history of present illness.     All other systems reviewed and are negative.  EKGs/Labs/Other Studies Reviewed:    The following studies were reviewed today:   EKG:  EKG is  ordered today.  The ekg ordered today demonstrates sinus bradycardia, heart rate 56, nonspecific ST changes.  Recent Labs: 08/07/2019: ALT 15; BUN 24; Creatinine, Ser 0.86; Hemoglobin 14.1; Platelets 240.0; Potassium 3.6; Sodium 141; TSH 2.56  Recent Lipid Panel    Component Value Date/Time   CHOL 233 (H) 03/19/2019 0855   TRIG 84.0 03/19/2019 0855   HDL 62.30 03/19/2019 0855   CHOLHDL 4 03/19/2019 0855   VLDL 16.8 03/19/2019 0855   LDLCALC 154 (H) 03/19/2019 0855   LDLDIRECT 175.7 09/09/2013 0934    Physical Exam:    VS:  BP (!) 143/77 (BP Location: Right Arm, Patient Position: Sitting, Cuff Size: Normal)   Pulse (!) 59   Temp (!) 96.8 F (36 C)   Ht 5\' 3"  (1.6 m)   Wt 187 lb (84.8 kg)   SpO2 98%    BMI 33.13 kg/m     Wt Readings from Last 3 Encounters:  08/19/19 187 lb (84.8 kg)  08/07/19 187 lb (84.8 kg)  03/28/19 190 lb (86.2 kg)     GEN:  Well nourished, well developed in no acute distress HEENT: Normal NECK: No JVD; No carotid bruits LYMPHATICS: No lymphadenopathy CARDIAC: RRR, no murmurs, rubs, gallops RESPIRATORY:  Clear to auscultation without rales, wheezing or rhonchi  ABDOMEN:  Soft, non-tender, non-distended MUSCULOSKELETAL:  No edema; No deformity  SKIN: Warm and dry NEUROLOGIC:  Alert and oriented x 3 PSYCHIATRIC:  Normal affect   ASSESSMENT:   BP elevated today.  Prior blood pressures on clinical visits have been within normal limits.  Irregular heartbeats could be due to premature beats or supraventricular arrhythmia such as A. Fib/flutter/SVT.  1. Irregular heart beat   2. Essential hypertension    PLAN:    In order of problems listed above:  1. 2-week cardiac monitor. 2. Continue current BP meds.  If BP elevated on follow-up visit we will plan to adjust medications.  This note was generated in part or whole with voice recognition software. Voice recognition is usually quite accurate but there are transcription errors that can and very often do occur. I apologize for any typographical errors that were not detected and corrected.  Medication Adjustments/Labs and Tests Ordered: Current medicines are reviewed at length with the patient today.  Concerns regarding medicines are outlined above.  Orders Placed This Encounter  Procedures  . LONG TERM MONITOR (3-14 DAYS)  . EKG 12-Lead   No orders of the defined types were placed in this encounter.   Patient Instructions  Medication Instructions:  Your physician recommends that you continue on your current medications as directed. Please refer to the Current Medication list given to you today.  *If you need a refill on your cardiac medications before your next appointment, please call your  pharmacy*  Lab Work: None ordered If you have labs (blood work) drawn today and your tests are completely normal, you will receive your results only by: Marland Kitchen MyChart Message (if you have MyChart) OR . A paper copy in the mail If you have any lab test that is abnormal or we need to change your treatment, we will call you to review the results.  Testing/Procedures: Your physician has recommended that you wear an zio monitor. Zio  monitors are medical devices that record the heart's electrical activity. Doctors most often [place] these monitors to diagnose arrhythmias. Arrhythmias are problems with the speed or rhythm of the heartbeat. The monitor is a small, portable device. You can wear one while you do your normal daily activities. This is usually used to diagnose what is causing palpitations/syncope (passing out). (To be worn for 14 days)   Follow-Up: At [facility], you and your health needs are our priority.  As part of our continuing mission to provide you with exceptional heart care, we have created designated Provider Care Teams.  These Care Teams include your primary Cardiologist (physician) and Advanced Practice Providers (APPs -  Physician Assistants and Nurse Practitioners) who all work together to provide you with the care you need, when you need it.  Your next appointment:   4 week(s)  The format for your next appointment:   In Person  Provider:    You may see Dr. Mylo Red- Charlestine Night or one of the following Advanced Practice Providers on your designated Care Team:    Murray Hodgkins, NP  Christell Faith, PA-C  Marrianne Mood, PA-C   Other Instructions Your physician has recommended that you wear a Zio monitor. This monitor is a medical device that records the heart's electrical activity. Doctors most often use these monitors to diagnose arrhythmias. Arrhythmias are problems with the speed or rhythm of the heartbeat. The monitor is a small device applied to your chest. You can wear  one while you do your normal daily activities. While wearing this monitor  if you have any symptoms to push the button and record what you felt. Once you have worn this monitor for the period of time provider prescribed (Usually 14 days), you will return the monitor device in the postage paid box. Once it is returned they will download the data collected and provide [place] with a report which the provider will then review and we will call you with those results. Important tips:  1. Avoid showering during the first 24 hours of wearing the monitor. 2. Avoid excessive sweating to help maximize wear time. 3. Do not submerge the device, no hot tubs, and no swimming pools. 4. Keep any lotions or oils away from the patch. 5. After 24 hours you may shower with the patch on. Take brief showers with your back facing the shower head.  6. Do not remove patch once it has been placed because that will interrupt data and decrease adhesive wear time. 7. Push the button when you have any symptoms and write down what you were feeling. 8. Once you have completed wearing your monitor, remove and place into box which has postage paid and place in your outgoing mailbox.  9. If for some reason you have misplaced your box then call our office and we can provide another box and/or mail it off for you.           Signed, Kate Sable, MD  08/19/2019 2:35 PM    [place]

## 2019-08-19 NOTE — Patient Instructions (Signed)
Medication Instructions:  Your physician recommends that you continue on your current medications as directed. Please refer to the Current Medication list given to you today.  *If you need a refill on your cardiac medications before your next appointment, please call your pharmacy*  Lab Work: None ordered If you have labs (blood work) drawn today and your tests are completely normal, you will receive your results only by: Marland Kitchen MyChart Message (if you have MyChart) OR . A paper copy in the mail If you have any lab test that is abnormal or we need to change your treatment, we will call you to review the results.  Testing/Procedures: Your physician has recommended that you wear an zio monitor. Zio  monitors are medical devices that record the heart's electrical activity. Doctors most often [place] these monitors to diagnose arrhythmias. Arrhythmias are problems with the speed or rhythm of the heartbeat. The monitor is a small, portable device. You can wear one while you do your normal daily activities. This is usually used to diagnose what is causing palpitations/syncope (passing out). (To be worn for 14 days)   Follow-Up: At [facility], you and your health needs are our priority.  As part of our continuing mission to provide you with exceptional heart care, we have created designated Provider Care Teams.  These Care Teams include your primary Cardiologist (physician) and Advanced Practice Providers (APPs -  Physician Assistants and Nurse Practitioners) who all work together to provide you with the care you need, when you need it.  Your next appointment:   4 week(s)  The format for your next appointment:   In Person  Provider:    You may see Dr. Mylo Red- Charlestine Night or one of the following Advanced Practice Providers on your designated Care Team:    Murray Hodgkins, NP  Christell Faith, PA-C  Marrianne Mood, PA-C   Other Instructions Your physician has recommended that you wear a Zio monitor.  This monitor is a medical device that records the heart's electrical activity. Doctors most often use these monitors to diagnose arrhythmias. Arrhythmias are problems with the speed or rhythm of the heartbeat. The monitor is a small device applied to your chest. You can wear one while you do your normal daily activities. While wearing this monitor if you have any symptoms to push the button and record what you felt. Once you have worn this monitor for the period of time provider prescribed (Usually 14 days), you will return the monitor device in the postage paid box. Once it is returned they will download the data collected and provide [place] with a report which the provider will then review and we will call you with those results. Important tips:  1. Avoid showering during the first 24 hours of wearing the monitor. 2. Avoid excessive sweating to help maximize wear time. 3. Do not submerge the device, no hot tubs, and no swimming pools. 4. Keep any lotions or oils away from the patch. 5. After 24 hours you may shower with the patch on. Take brief showers with your back facing the shower head.  6. Do not remove patch once it has been placed because that will interrupt data and decrease adhesive wear time. 7. Push the button when you have any symptoms and write down what you were feeling. 8. Once you have completed wearing your monitor, remove and place into box which has postage paid and place in your outgoing mailbox.  9. If for some reason you have misplaced your box  then call our office and we can provide another box and/or mail it off for you.

## 2019-09-02 DIAGNOSIS — I499 Cardiac arrhythmia, unspecified: Secondary | ICD-10-CM

## 2019-09-11 DIAGNOSIS — R002 Palpitations: Secondary | ICD-10-CM | POA: Diagnosis not present

## 2019-09-16 ENCOUNTER — Encounter: Payer: Self-pay | Admitting: Cardiology

## 2019-09-16 ENCOUNTER — Other Ambulatory Visit: Payer: Self-pay

## 2019-09-16 ENCOUNTER — Ambulatory Visit (INDEPENDENT_AMBULATORY_CARE_PROVIDER_SITE_OTHER): Payer: Medicare HMO | Admitting: Cardiology

## 2019-09-16 VITALS — BP 130/76 | HR 55 | Ht 63.0 in | Wt 187.0 lb

## 2019-09-16 DIAGNOSIS — I480 Paroxysmal atrial fibrillation: Secondary | ICD-10-CM | POA: Diagnosis not present

## 2019-09-16 DIAGNOSIS — I10 Essential (primary) hypertension: Secondary | ICD-10-CM | POA: Diagnosis not present

## 2019-09-16 MED ORDER — APIXABAN 5 MG PO TABS: 5.0000 mg | ORAL_TABLET | Freq: Two times a day (BID) | ORAL | 1 refills | Status: DC

## 2019-09-16 MED ORDER — APIXABAN 5 MG PO TABS: 5.0000 mg | ORAL_TABLET | Freq: Two times a day (BID) | ORAL | 5 refills | Status: DC

## 2019-09-16 NOTE — Progress Notes (Signed)
Cardiology Office Note:    Date:  09/16/2019   ID:  Monica Guerrero, DOB 12/11/1940, MRN [id number]  PCP:  Abner Greenspan, MD  Cardiologist:  No primary care provider on file.  Electrophysiologist:  None   Referring MD: Abner Greenspan, MD   Chief Complaint  Patient presents with  . office visit    F/U after wearing ZIO monitor; Meds verbally reviewed with patient.    History of Present Illness:    Monica Guerrero is a 79 y.o. female with a hx of hypertension, SVT who presents for follow-up.  She was seen due to palpitations.  She noticed irregular/skipped heartbeats for 2 weeks, occurring daily lasting a few seconds.   She denies palpitations, dizziness, chest pain, shortness of breath, edema, syncope.  She states having a history of SVT diagnosed about 25 years ago with her heart rates going up to 170s.    She was placed on verapamil which suppress the symptoms.   Cardiac monitor was ordered.  She now presents for results.  Past Medical History:  Diagnosis Date  . Allergy   . Arthritis    Osteoarthritis  . Cancer ([place])    skin  . Hypertension   . Osteoporosis     Past Surgical History:  Procedure Laterality Date  . BASAL CELL CARCINOMA EXCISION  12/2016   neck  . EYE SURGERY     Bilateral cataract Surgery  . SQUAMOUS CELL CARCINOMA EXCISION  12/2016   neck  . TONSILLECTOMY      Current Medications: Current Meds  Medication Sig  . acetaminophen (TYLENOL ARTHRITIS PAIN) 650 MG CR tablet Take 650 mg by mouth every 8 (eight) hours as needed. Alternates with Ibuprofen.   . bisoprolol-hydrochlorothiazide (ZIAC) 2.5-6.25 MG tablet Take 1 tablet by mouth daily.  . calcium carbonate (OS-CAL) 600 MG TABS Take 600 mg by mouth daily.   . chlorpheniramine (CHLOR-TRIMETON) 4 MG tablet Take 4 mg by mouth 2 (two) times daily as needed.    . cholecalciferol (VITAMIN D) 1000 UNITS tablet Take 2,000 Units by mouth daily.  Marland Kitchen GARLIC PO Take 1 capsule by mouth daily.  .  hydrochlorothiazide (HYDRODIURIL) 25 MG tablet Take 1 tablet (25 mg total) by mouth daily.  Marland Kitchen Lifitegrast (XIIDRA) 5 % SOLN Place 1 drop into both eyes 2 (two) times a day.  . loratadine (CLARITIN) 10 MG tablet Take 10 mg by mouth as directed.    . Multiple Vitamins-Minerals (PRESERVISION AREDS 2) CAPS Take 1 capsule by mouth 2 (two) times a day.  . phenylephrine (NEO-SYNEPHRINE) 0.125 % nasal drops Place 1 drop into the nose as needed.  Vladimir Faster Glycol-Propyl Glycol (SYSTANE OP) Apply to eye 2 (two) times daily as needed.  . timolol (BETIMOL) 0.5 % ophthalmic solution Place 1 drop into both eyes daily.   . timolol (TIMOPTIC) 0.5 % ophthalmic solution   . verapamil (CALAN-SR) 240 MG CR tablet Take 1 tablet (240 mg total) by mouth at bedtime.  . [DISCONTINUED] aspirin 81 MG tablet Take 81 mg by mouth daily.       Allergies:   Patient has no known allergies.   Social History   Socioeconomic History  . Marital status: Single    Spouse name: Not on file  . Number of children: 2  . Years of education: Not on file  . Highest education level: Not on file  Occupational History  . Occupation: Bank Teller  Tobacco Use  . Smoking status: Never Smoker  .  Smokeless tobacco: Never Used  Substance and Sexual Activity  . Alcohol use: No    Alcohol/week: 0.0 standard drinks  . Drug use: No  . Sexual activity: Not Currently  Other Topics Concern  . Not on file  Social History Narrative  . Not on file   Social Determinants of Health   Financial Resource Strain:   . Difficulty of Paying Living Expenses: Not on file  Food Insecurity:   . Worried About Charity fundraiser in the Last Year: Not on file  . Ran Out of Food in the Last Year: Not on file  Transportation Needs:   . Lack of Transportation (Medical): Not on file  . Lack of Transportation (Non-Medical): Not on file  Physical Activity:   . Days of Exercise per Week: Not on file  . Minutes of Exercise per Session: Not on file    Stress:   . Feeling of Stress : Not on file  Social Connections:   . Frequency of Communication with Friends and Family: Not on file  . Frequency of Social Gatherings with Friends and Family: Not on file  . Attends Religious Services: Not on file  . Active Member of Clubs or Organizations: Not on file  . Attends Archivist Meetings: Not on file  . Marital Status: Not on file     Family History: The patient's family history includes Cancer in her father and paternal grandmother; Dementia in her mother; Heart disease in her father, maternal grandfather, and maternal uncle; Hypertension in her brother; Stroke in her maternal grandmother. There is no history of Breast cancer.  ROS:   Please see the history of present illness.     All other systems reviewed and are negative.  EKGs/Labs/Other Studies Reviewed:    The following studies were reviewed today: 2-week cardiac monitor date 09/12/2019 Patient had a min HR of 41 bpm, max HR of 176 bpm, and avg HR of 61 bpm. Predominant underlying rhythm was Sinus Rhythm. 3191 Supraventricular Tachycardia runs occurred, the run with the fastest interval lasting 1 min 51 secs with a max rate of 176 bpm, the longest lasting 2 mins 52 secs with an avg rate of 149 bpm. Atrial Fibrillation occurred (5% burden), ranging from 57-146 bpm (avg of 87 bpm), the longest lasting 26 mins 10 secs with an avg rate of 91 bpm. Supraventricular Tachycardia and Atrial Fibrillation were detected within +/- 45 seconds of symptomatic patient event(s).  EKG:  EKG is  ordered today.  The ekg ordered today demonstrates sinus bradycardia, heart rate 55, .  Recent Labs: 08/07/2019: ALT 15; BUN 24; Creatinine, Ser 0.86; Hemoglobin 14.1; Platelets 240.0; Potassium 3.6; Sodium 141; TSH 2.56  Recent Lipid Panel    Component Value Date/Time   CHOL 233 (H) 03/19/2019 0855   TRIG 84.0 03/19/2019 0855   HDL 62.30 03/19/2019 0855   CHOLHDL 4 03/19/2019 0855   VLDL  16.8 03/19/2019 0855   LDLCALC 154 (H) 03/19/2019 0855   LDLDIRECT 175.7 09/09/2013 0934    Physical Exam:    VS:  BP 130/76 (BP Location: Left Arm, Patient Position: Sitting, Cuff Size: Normal)   Pulse (!) 55   Ht 5\' 3"  (1.6 m)   Wt 187 lb (84.8 kg)   SpO2 97%   BMI 33.13 kg/m     Wt Readings from Last 3 Encounters:  09/16/19 187 lb (84.8 kg)  08/19/19 187 lb (84.8 kg)  08/07/19 187 lb (84.8 kg)     GEN:  Well nourished, well developed in no acute distress HEENT: Normal NECK: No JVD; No carotid bruits LYMPHATICS: No lymphadenopathy CARDIAC: RRR, no murmurs, rubs, gallops RESPIRATORY:  Clear to auscultation without rales, wheezing or rhonchi  ABDOMEN: Soft, non-tender, non-distended MUSCULOSKELETAL:  No edema; No deformity  SKIN: Warm and dry NEUROLOGIC:  Alert and oriented x 3 PSYCHIATRIC:  Normal affect   ASSESSMENT:   2-week cardiac monitor shows atrial fibrillation, 5% burden.  CHA2DS2-VASc score = 4 (age, htn, gender)  1. Paroxysmal atrial fibrillation (HCC)   2. Essential hypertension    PLAN:    In order of problems listed above:  1. Start Eliquis 5 mg twice daily.  Continue verapamil and bisoprolol as prescribed.  Get echocardiogram.  If echocardiogram shows abnormal EF, will then plan to switch from calcium channel blocker. 2. Continue current BP meds.    Follow-up in 3 months.  This note was generated in part or whole with voice recognition software. Voice recognition is usually quite accurate but there are transcription errors that can and very often do occur. I apologize for any typographical errors that were not detected and corrected.  Medication Adjustments/Labs and Tests Ordered: Current medicines are reviewed at length with the patient today.  Concerns regarding medicines are outlined above.  Orders Placed This Encounter  Procedures  . EKG 12-Lead  . ECHOCARDIOGRAM COMPLETE   Meds ordered this encounter  Medications  . DISCONTD: apixaban  (ELIQUIS) 5 MG TABS tablet    Sig: Take 1 tablet (5 mg total) by mouth 2 (two) times daily.    Dispense:  60 tablet    Refill:  5  . apixaban (ELIQUIS) 5 MG TABS tablet    Sig: Take 1 tablet (5 mg total) by mouth 2 (two) times daily.    Dispense:  180 tablet    Refill:  1    Patient will call when refill is needed    Patient Instructions  Medication Instructions:  Your physician has recommended you make the following change in your medication:   1) STOP Aspirin  2) START Eliquis 5mg  twice daily. An Rx has been sent to your pharmacy.  *If you need a refill on your cardiac medications before your next appointment, please call your pharmacy*  Lab Work: None ordered If you have labs (blood work) drawn today and your tests are completely normal, you will receive your results only by: Marland Kitchen MyChart Message (if you have MyChart) OR . A paper copy in the mail If you have any lab test that is abnormal or we need to change your treatment, we will call you to review the results.  Testing/Procedures: Your physician has requested that you have an echocardiogram. Echocardiography is a painless test that uses sound waves to create images of your heart. It provides your doctor with information about the size and shape of your heart and how well your heart's chambers and valves are working. This procedure takes approximately one hour. There are no restrictions for this procedure.    Follow-Up: At [facility], you and your health needs are our priority.  As part of our continuing mission to provide you with exceptional heart care, we have created designated Provider Care Teams.  These Care Teams include your primary Cardiologist (physician) and Advanced Practice Providers (APPs -  Physician Assistants and Nurse Practitioners) who all work together to provide you with the care you need, when you need it.  Your next appointment:   3 month(s)  The format for your  next appointment:   In  Person  Provider:    You may see Dr. Garen Lah  or one of the following Advanced Practice Providers on your designated Care Team:    Murray Hodgkins, NP  Christell Faith, PA-C  Marrianne Mood, PA-C   Other Instructions   Echocardiogram An echocardiogram is a procedure that uses painless sound waves (ultrasound) to produce an image of the heart. Images from an echocardiogram can provide important information about:  Signs of coronary artery disease (CAD).  Aneurysm detection. An aneurysm is a weak or damaged part of an artery wall that bulges out from the normal force of blood pumping through the body.  Heart size and shape. Changes in the size or shape of the heart can be associated with certain conditions, including heart failure, aneurysm, and CAD.  Heart muscle function.  Heart valve function.  Signs of a past heart attack.  Fluid buildup around the heart.  Thickening of the heart muscle.  A tumor or infectious growth around the heart valves. Tell a health care provider about:  Any allergies you have.  All medicines you are taking, including vitamins, herbs, eye drops, creams, and over-the-counter medicines.  Any blood disorders you have.  Any surgeries you have had.  Any medical conditions you have.  Whether you are pregnant or may be pregnant. What are the risks? Generally, this is a safe procedure. However, problems may occur, including:  Allergic reaction to dye (contrast) that may be used during the procedure. What happens before the procedure? No specific preparation is needed. You may eat and drink normally. What happens during the procedure?   An IV tube may be inserted into one of your veins.  You may receive contrast through this tube. A contrast is an injection that improves the quality of the pictures from your heart.  A gel will be applied to your chest.  A wand-like tool (transducer) will be moved over your chest. The gel will help to  transmit the sound waves from the transducer.  The sound waves will harmlessly bounce off of your heart to allow the heart images to be captured in real-time motion. The images will be recorded on a computer. The procedure may vary among health care providers and hospitals. What happens after the procedure?  You may return to your normal, everyday life, including diet, activities, and medicines, unless your health care provider tells you not to do that. Summary  An echocardiogram is a procedure that uses painless sound waves (ultrasound) to produce an image of the heart.  Images from an echocardiogram can provide important information about the size and shape of your heart, heart muscle function, heart valve function, and fluid buildup around your heart.  You do not need to do anything to prepare before this procedure. You may eat and drink normally.  After the echocardiogram is completed, you may return to your normal, everyday life, unless your health care provider tells you not to do that. This information is not intended to replace advice given to you by your health care provider. Make sure you discuss any questions you have with your health care provider. Document Revised: 12/13/2018 Document Reviewed: 09/24/2016 Elsevier Patient Education  2020 [place], Kate Sable, MD  09/16/2019 4:53 PM    [place]

## 2019-09-16 NOTE — Patient Instructions (Signed)
Medication Instructions:  Your physician has recommended you make the following change in your medication:   1) STOP Aspirin  2) START Eliquis 5mg  twice daily. An Rx has been sent to your pharmacy.  *If you need a refill on your cardiac medications before your next appointment, please call your pharmacy*  Lab Work: None ordered If you have labs (blood work) drawn today and your tests are completely normal, you will receive your results only by: Marland Kitchen MyChart Message (if you have MyChart) OR . A paper copy in the mail If you have any lab test that is abnormal or we need to change your treatment, we will call you to review the results.  Testing/Procedures: Your physician has requested that you have an echocardiogram. Echocardiography is a painless test that uses sound waves to create images of your heart. It provides your doctor with information about the size and shape of your heart and how well your heart's chambers and valves are working. This procedure takes approximately one hour. There are no restrictions for this procedure.    Follow-Up: At [facility], you and your health needs are our priority.  As part of our continuing mission to provide you with exceptional heart care, we have created designated Provider Care Teams.  These Care Teams include your primary Cardiologist (physician) and Advanced Practice Providers (APPs -  Physician Assistants and Nurse Practitioners) who all work together to provide you with the care you need, when you need it.  Your next appointment:   3 month(s)  The format for your next appointment:   In Person  Provider:    You may see Dr. Garen Lah  or one of the following Advanced Practice Providers on your designated Care Team:    Murray Hodgkins, NP  Christell Faith, PA-C  Marrianne Mood, PA-C   Other Instructions   Echocardiogram An echocardiogram is a procedure that uses painless sound waves (ultrasound) to produce an image of the heart.  Images from an echocardiogram can provide important information about:  Signs of coronary artery disease (CAD).  Aneurysm detection. An aneurysm is a weak or damaged part of an artery wall that bulges out from the normal force of blood pumping through the body.  Heart size and shape. Changes in the size or shape of the heart can be associated with certain conditions, including heart failure, aneurysm, and CAD.  Heart muscle function.  Heart valve function.  Signs of a past heart attack.  Fluid buildup around the heart.  Thickening of the heart muscle.  A tumor or infectious growth around the heart valves. Tell a health care provider about:  Any allergies you have.  All medicines you are taking, including vitamins, herbs, eye drops, creams, and over-the-counter medicines.  Any blood disorders you have.  Any surgeries you have had.  Any medical conditions you have.  Whether you are pregnant or may be pregnant. What are the risks? Generally, this is a safe procedure. However, problems may occur, including:  Allergic reaction to dye (contrast) that may be used during the procedure. What happens before the procedure? No specific preparation is needed. You may eat and drink normally. What happens during the procedure?   An IV tube may be inserted into one of your veins.  You may receive contrast through this tube. A contrast is an injection that improves the quality of the pictures from your heart.  A gel will be applied to your chest.  A wand-like tool (transducer) will be moved over  your chest. The gel will help to transmit the sound waves from the transducer.  The sound waves will harmlessly bounce off of your heart to allow the heart images to be captured in real-time motion. The images will be recorded on a computer. The procedure may vary among health care providers and hospitals. What happens after the procedure?  You may return to your normal, everyday life,  including diet, activities, and medicines, unless your health care provider tells you not to do that. Summary  An echocardiogram is a procedure that uses painless sound waves (ultrasound) to produce an image of the heart.  Images from an echocardiogram can provide important information about the size and shape of your heart, heart muscle function, heart valve function, and fluid buildup around your heart.  You do not need to do anything to prepare before this procedure. You may eat and drink normally.  After the echocardiogram is completed, you may return to your normal, everyday life, unless your health care provider tells you not to do that. This information is not intended to replace advice given to you by your health care provider. Make sure you discuss any questions you have with your health care provider. Document Revised: 12/13/2018 Document Reviewed: 09/24/2016 Elsevier Patient Education  [place].

## 2019-09-26 ENCOUNTER — Other Ambulatory Visit: Payer: Self-pay | Admitting: Cardiology

## 2019-09-26 DIAGNOSIS — I480 Paroxysmal atrial fibrillation: Secondary | ICD-10-CM

## 2019-10-08 ENCOUNTER — Ambulatory Visit (INDEPENDENT_AMBULATORY_CARE_PROVIDER_SITE_OTHER): Payer: Medicare HMO

## 2019-10-08 ENCOUNTER — Other Ambulatory Visit: Payer: Self-pay

## 2019-10-08 DIAGNOSIS — I480 Paroxysmal atrial fibrillation: Secondary | ICD-10-CM

## 2019-10-09 ENCOUNTER — Other Ambulatory Visit: Payer: Self-pay

## 2019-10-09 MED ORDER — APIXABAN 5 MG PO TABS: 5.0000 mg | ORAL_TABLET | Freq: Two times a day (BID) | ORAL | 1 refills | Status: DC

## 2019-10-10 ENCOUNTER — Telehealth: Payer: Self-pay

## 2019-10-10 NOTE — Telephone Encounter (Signed)
Call to patient to discuss echo results and POC.  Pt verbalized understanding and had no further questions at this time.   No further orders.   Confirmed upcoming appt.   Advised pt to call for any further questions or concerns.

## 2019-10-10 NOTE — Telephone Encounter (Signed)
-----   Message from Kate Sable, MD sent at 10/10/2019  2:41 PM EST ----- Normal ejection fraction, impaired relaxation.  Otherwise normal echocardiogram.

## 2019-11-28 ENCOUNTER — Other Ambulatory Visit: Payer: Self-pay

## 2019-11-28 ENCOUNTER — Ambulatory Visit: Payer: Medicare HMO | Admitting: Dermatology

## 2019-11-28 ENCOUNTER — Encounter: Payer: Self-pay | Admitting: Dermatology

## 2019-11-28 DIAGNOSIS — Z85828 Personal history of other malignant neoplasm of skin: Secondary | ICD-10-CM | POA: Diagnosis not present

## 2019-11-28 DIAGNOSIS — Z86007 Personal history of in-situ neoplasm of skin: Secondary | ICD-10-CM

## 2019-11-28 DIAGNOSIS — D692 Other nonthrombocytopenic purpura: Secondary | ICD-10-CM

## 2019-11-28 DIAGNOSIS — L570 Actinic keratosis: Secondary | ICD-10-CM | POA: Diagnosis not present

## 2019-11-28 DIAGNOSIS — L720 Epidermal cyst: Secondary | ICD-10-CM | POA: Diagnosis not present

## 2019-11-28 DIAGNOSIS — L821 Other seborrheic keratosis: Secondary | ICD-10-CM

## 2019-11-28 DIAGNOSIS — L578 Other skin changes due to chronic exposure to nonionizing radiation: Secondary | ICD-10-CM

## 2019-11-28 DIAGNOSIS — D2230 Melanocytic nevi of unspecified part of face: Secondary | ICD-10-CM

## 2019-11-28 DIAGNOSIS — Z1283 Encounter for screening for malignant neoplasm of skin: Secondary | ICD-10-CM | POA: Diagnosis not present

## 2019-11-28 DIAGNOSIS — D229 Melanocytic nevi, unspecified: Secondary | ICD-10-CM

## 2019-11-28 DIAGNOSIS — D225 Melanocytic nevi of trunk: Secondary | ICD-10-CM

## 2019-11-28 DIAGNOSIS — B353 Tinea pedis: Secondary | ICD-10-CM

## 2019-11-28 DIAGNOSIS — I781 Nevus, non-neoplastic: Secondary | ICD-10-CM

## 2019-11-28 DIAGNOSIS — D1801 Hemangioma of skin and subcutaneous tissue: Secondary | ICD-10-CM

## 2019-11-28 MED ORDER — KETOCONAZOLE 2 % EX CREA: 1.0000 "application " | TOPICAL_CREAM | Freq: Every day | CUTANEOUS | 3 refills | Status: DC

## 2019-11-28 MED ORDER — TAVABOROLE 5 % EX SOLN: 1.0000 "application " | Freq: Every day | CUTANEOUS | 3 refills | Status: DC

## 2019-11-28 NOTE — Patient Instructions (Signed)

## 2019-11-28 NOTE — Progress Notes (Signed)
   Follow-Up Visit   Subjective  Monica Guerrero is a 79 y.o. female who presents for the following: Annual Exam (hx BCCs, SCCIS).   The following portions of the chart were reviewed this encounter and updated as appropriate:     Review of Systems: No other skin or systemic complaints.  Objective  Well appearing patient in no apparent distress; mood and affect are within normal limits.  A full examination was performed including scalp, head, eyes, ears, nose, lips, neck, chest, axillae, abdomen, back, buttocks, bilateral upper extremities, bilateral lower extremities, hands, feet, fingers, toes, fingernails, and toenails. All findings within normal limits unless otherwise noted below.  Objective  R cheek ~5.0cm lat to oral commissure x 1, L temple ~3.5c mlat to lateral canthus x 1, L index finger lat at PIP joint x 1 (3): Pink scaly macules   Objective  face, body: Actinic changes   Objective  trunk: Red paps  Objective  L lat neck anterior, L lat neck posterior: Clear, no evidence of recurrence  Objective  L neck lat near angle of mandible: Clear, no evidence of recurrence  Objective  face: White paps  Objective  face, trunk: Regular brown macules  Objective  bil arms: purpura  Objective  arms, trunk: Stuck-on, waxy, tan-brown papule or plaque --Discussed benign etiology and prognosis.   Objective  bil legs: Varices legs  Objective  R foot: Scaliness of R foot with toenail dystrophy  Assessment & Plan  AK (actinic keratosis) (3) R cheek ~5.0cm lat to oral commissure x 1, L temple ~3.5c mlat to lateral canthus x 1, L index finger lat at PIP joint x 1  Destruction of lesion - R cheek ~5.0cm lat to oral commissure x 1, L temple ~3.5c mlat to lateral canthus x 1, L index finger lat at PIP joint x 1 Complexity: simple   Destruction method: cryotherapy   Informed consent: discussed and consent obtained   Timeout:  patient name, date of birth, surgical  site, and procedure verified Lesion destroyed using liquid nitrogen: Yes   Region frozen until ice ball extended beyond lesion: Yes   Outcome: patient tolerated procedure well with no complications   Post-procedure details: wound care instructions given    Actinic skin damage face, body  Recommend broad spectrum SPF and photoprotection   Hemangioma of skin trunk  Benign, observe.    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    Nevus face, trunk  Benign-appearing.  Observation.   Purpura ([place]) bil arms  Benign, observe.    Screening exam for skin cancer  Seborrheic keratosis arms, trunk  Benign, observe.    Telangiectasia bil legs  Benign, observe.    Tinea pedis of right foot R foot  With Tinea Unguium  Start Ketoconazole 2% cr qhs Start Kerydin sol qhs to affected toenails  ketoconazole (NIZORAL) 2 % cream - R foot  Tavaborole (KERYDIN) 5 % SOLN - R foot  Return in about 1 year (around 11/27/2020) for TBSE.   I, Othelia Pulling, RMA, am acting as scribe for Sarina Ser, MD .

## 2019-12-06 ENCOUNTER — Other Ambulatory Visit: Payer: Self-pay

## 2019-12-06 ENCOUNTER — Ambulatory Visit (INDEPENDENT_AMBULATORY_CARE_PROVIDER_SITE_OTHER): Payer: Medicare HMO | Admitting: Cardiology

## 2019-12-06 ENCOUNTER — Encounter: Payer: Self-pay | Admitting: Cardiology

## 2019-12-06 VITALS — BP 130/80 | HR 60 | Ht 63.0 in | Wt 186.0 lb

## 2019-12-06 DIAGNOSIS — I480 Paroxysmal atrial fibrillation: Secondary | ICD-10-CM | POA: Diagnosis not present

## 2019-12-06 DIAGNOSIS — I10 Essential (primary) hypertension: Secondary | ICD-10-CM

## 2019-12-06 NOTE — Progress Notes (Signed)
Cardiology Office Note:    Date:  12/06/2019   ID:  Monica Guerrero, DOB [birth year], MRN [id number]  PCP:  Abner Greenspan, MD  Cardiologist:  No primary care provider on file.  Electrophysiologist:  None   Referring MD: Abner Greenspan, MD   Chief Complaint  Patient presents with  . office visit    3 month F/U; Meds verbally reviewed with patient.    History of Present Illness:    Monica Guerrero is a 79 y.o. female with a hx of hypertension, paroxysmal A. fib who presents for follow-up.  She was seen previously seen due to palpitations. 2-week cardiac monitor shows atrial fibrillation, 5% burden.  CHA2DS2-VASc score = 4 (age, htn, gender).  She was started on Eliquis.  Echocardiogram was ordered to evaluate systolic function.  She currently feels fine denies palpitations or shortness of breath.  Her blood pressure was recently elevated with systolic up to [id number].  Her primary care provider added HCTZ 25 mg to her blood pressure regimen.  Her blood pressures have since been controlled, patient denies any symptoms of dizziness, presyncope, headaches.   Past Medical History:  Diagnosis Date  . Allergy   . Arthritis    Osteoarthritis  . Basal cell carcinoma 12/06/2016   L ant lat neck anteriro  . Basal cell carcinoma 10/20/2016   L ant lat neck posterior  . Cancer ([place])    skin  . Hypertension   . Osteoporosis   . 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 BASAL CELL CARCINOMA EXCISION  12/2016   neck  . EYE SURGERY     Bilateral cataract Surgery  . SQUAMOUS CELL CARCINOMA EXCISION  12/2016   neck  . TONSILLECTOMY      Current Medications: Current Meds  Medication Sig  . acetaminophen (TYLENOL ARTHRITIS PAIN) 650 MG CR tablet Take 650 mg by mouth every 8 (eight) hours as needed. Alternates with Ibuprofen.   Marland Kitchen apixaban (ELIQUIS) 5 MG TABS tablet Take 1 tablet (5 mg total) by  mouth 2 (two) times daily.  . bisoprolol-hydrochlorothiazide (ZIAC) 2.5-6.25 MG tablet Take 1 tablet by mouth daily.  . calcium carbonate (OS-CAL) 600 MG TABS Take 600 mg by mouth daily.   . chlorpheniramine (CHLOR-TRIMETON) 4 MG tablet Take 4 mg by mouth 2 (two) times daily as needed.    . cholecalciferol (VITAMIN D) 1000 UNITS tablet Take 2,000 Units by mouth daily.  Marland Kitchen GARLIC PO Take 1 capsule by mouth daily.  . hydrochlorothiazide (HYDRODIURIL) 25 MG tablet Take 1 tablet (25 mg total) by mouth daily.  Marland Kitchen Lifitegrast (XIIDRA) 5 % SOLN Place 1 drop into both eyes 2 (two) times a day.  . loratadine (CLARITIN) 10 MG tablet Take 10 mg by mouth as directed.    . Multiple Vitamins-Minerals (PRESERVISION AREDS 2) CAPS Take 1 capsule by mouth 2 (two) times a day.  . phenylephrine (NEO-SYNEPHRINE) 0.125 % nasal drops Place 1 drop into the nose as needed.  Vladimir Faster Glycol-Propyl Glycol (SYSTANE OP) Apply to eye 2 (two) times daily as needed.  . timolol (BETIMOL) 0.5 % ophthalmic solution Place 1 drop into both eyes daily.   . timolol (TIMOPTIC) 0.5 % ophthalmic solution   . verapamil (CALAN-SR) 240 MG CR tablet Take 1 tablet (240 mg total) by mouth at bedtime.     Allergies:   Patient has  no known allergies.   Social History   Socioeconomic History  . Marital status: Single    Spouse name: Not on file  . Number of children: 2  . Years of education: Not on file  . Highest education level: Not on file  Occupational History  . Occupation: Bank Teller  Tobacco Use  . Smoking status: Never Smoker  . Smokeless tobacco: Never Used  Substance and Sexual Activity  . Alcohol use: No    Alcohol/week: 0.0 standard drinks  . Drug use: No  . Sexual activity: Not Currently  Other Topics Concern  . Not on file  Social History Narrative  . Not on file   Social Determinants of Health   Financial Resource Strain:   . Difficulty of Paying Living Expenses:   Food Insecurity:   . Worried About  Charity fundraiser in the Last Year:   . Arboriculturist in the Last Year:   Transportation Needs:   . Film/video editor (Medical):   Marland Kitchen Lack of Transportation (Non-Medical):   Physical Activity:   . Days of Exercise per Week:   . Minutes of Exercise per Session:   Stress:   . Feeling of Stress :   Social Connections:   . Frequency of Communication with Friends and Family:   . Frequency of Social Gatherings with Friends and Family:   . Attends Religious Services:   . Active Member of Clubs or Organizations:   . Attends Archivist Meetings:   Marland Kitchen Marital Status:      Family History: The patient's family history includes Cancer in her father and paternal grandmother; Dementia in her mother; Heart disease in her father, maternal grandfather, and maternal uncle; Hypertension in her brother; Stroke in her maternal grandmother. There is no history of Breast cancer.  ROS:   Please see the history of present illness.     All other systems reviewed and are negative.  EKGs/Labs/Other Studies Reviewed:    The following studies were reviewed today: 2-week cardiac monitor date 09/12/2019 Patient had a min HR of 41 bpm, max HR of 176 bpm, and avg HR of 61 bpm. Predominant underlying rhythm was Sinus Rhythm. 3191 Supraventricular Tachycardia runs occurred, the run with the fastest interval lasting 1 min 51 secs with a max rate of 176 bpm, the longest lasting 2 mins 52 secs with an avg rate of 149 bpm. Atrial Fibrillation occurred (5% burden), ranging from 57-146 bpm (avg of 87 bpm), the longest lasting 26 mins 10 secs with an avg rate of 91 bpm. Supraventricular Tachycardia and Atrial Fibrillation were detected within +/- 45 seconds of symptomatic patient event(s).  EKG:  EKG is  ordered today.  The ekg ordered today demonstrates sinus rhythm with PACs..  Recent Labs: 08/07/2019: ALT 15; BUN 24; Creatinine, Ser 0.86; Hemoglobin 14.1; Platelets 240.0; Potassium 3.6; Sodium 141;  TSH 2.56  Recent Lipid Panel    Component Value Date/Time   CHOL 233 (H) 03/19/2019 0855   TRIG 84.0 03/19/2019 0855   HDL 62.30 03/19/2019 0855   CHOLHDL 4 03/19/2019 0855   VLDL 16.8 03/19/2019 0855   LDLCALC 154 (H) 03/19/2019 0855   LDLDIRECT 175.7 09/09/2013 0934    Physical Exam:    VS:  BP 130/80 (BP Location: Left Arm, Patient Position: Sitting, Cuff Size: Normal)   Pulse 60   Ht 5\' 3"  (1.6 m)   Wt 186 lb (84.4 kg)   SpO2 98%   BMI 32.95 kg/m  Wt Readings from Last 3 Encounters:  12/06/19 186 lb (84.4 kg)  09/16/19 187 lb (84.8 kg)  08/19/19 187 lb (84.8 kg)     GEN:  Well nourished, well developed in no acute distress HEENT: Normal NECK: No JVD; No carotid bruits LYMPHATICS: No lymphadenopathy CARDIAC: RRR, no murmurs, rubs, gallops RESPIRATORY:  Clear to auscultation without rales, wheezing or rhonchi  ABDOMEN: Soft, non-tender, non-distended MUSCULOSKELETAL:  No edema; No deformity  SKIN: Warm and dry NEUROLOGIC:  Alert and oriented x 3 PSYCHIATRIC:  Normal affect   ASSESSMENT:    1. Paroxysmal atrial fibrillation (HCC)   2. Essential hypertension    PLAN:    In order of problems listed above:  1. History of paroxysmal atrial fibrillation.  Currently in sinus.  CHA2DS2-VASc score of 4 (age, htn, gender).  Echocardiogram shows normal systolic function with EF 60 to 65%, impaired relaxation.  Continue Eliquis 5 mg twice daily.  Continue verapamil, bisoprolol.  2. History of hypertension, BP reasonably controlled,  continue HCTZ 25 daily, verapamil 240 mg daily, beta-blocker.     Follow-up in 6 months.  This note was generated in part or whole with voice recognition software. Voice recognition is usually quite accurate but there are transcription errors that can and very often do occur. I apologize for any typographical errors that were not detected and corrected.  Medication Adjustments/Labs and Tests Ordered: Current medicines are reviewed at  length with the patient today.  Concerns regarding medicines are outlined above.  Orders Placed This Encounter  Procedures  . EKG 12-Lead   No orders of the defined types were placed in this encounter.   Patient Instructions  Medication Instructions:  Your physician recommends that you continue on your current medications as directed. Please refer to the Current Medication list given to you today.  *If you need a refill on your cardiac medications before your next appointment, please call your pharmacy*   Lab Work: None ordered If you have labs (blood work) drawn today and your tests are completely normal, you will receive your results only by: Marland Kitchen MyChart Message (if you have MyChart) OR . A paper copy in the mail If you have any lab test that is abnormal or we need to change your treatment, we will call you to review the results.   Testing/Procedures: None ordered   Follow-Up: At [facility], you and your health needs are our priority.  As part of our continuing mission to provide you with exceptional heart care, we have created designated Provider Care Teams.  These Care Teams include your primary Cardiologist (physician) and Advanced Practice Providers (APPs -  Physician Assistants and Nurse Practitioners) who all work together to provide you with the care you need, when you need it.  We recommend signing up for the patient portal called "MyChart".  Sign up information is provided on this After Visit Summary.  MyChart is used to connect with patients for Virtual Visits (Telemedicine).  Patients are able to view lab/test results, encounter notes, upcoming appointments, etc.  Non-urgent messages can be sent to your provider as well.   To learn more about what you can do with MyChart, go to [email or web address].    Your next appointment:   6 month(s)  The format for your next appointment:   In Person  Provider:    You may see Dr. Garen Lah or one of the following  Advanced Practice Providers on your designated Care Team:    Murray Hodgkins, NP  Christell Faith,  PA-C  Marrianne Mood, PA-C    Other Instructions N/A     Signed, Kate Sable, MD  12/06/2019 3:29 PM    [facility]

## 2019-12-06 NOTE — Patient Instructions (Signed)
Medication Instructions:  Your physician recommends that you continue on your current medications as directed. Please refer to the Current Medication list given to you today.  *If you need a refill on your cardiac medications before your next appointment, please call your pharmacy*   Lab Work: None ordered If you have labs (blood work) drawn today and your tests are completely normal, you will receive your results only by: Marland Kitchen MyChart Message (if you have MyChart) OR . A paper copy in the mail If you have any lab test that is abnormal or we need to change your treatment, we will call you to review the results.   Testing/Procedures: None ordered   Follow-Up: At [facility], you and your health needs are our priority.  As part of our continuing mission to provide you with exceptional heart care, we have created designated Provider Care Teams.  These Care Teams include your primary Cardiologist (physician) and Advanced Practice Providers (APPs -  Physician Assistants and Nurse Practitioners) who all work together to provide you with the care you need, when you need it.  We recommend signing up for the patient portal called "MyChart".  Sign up information is provided on this After Visit Summary.  MyChart is used to connect with patients for Virtual Visits (Telemedicine).  Patients are able to view lab/test results, encounter notes, upcoming appointments, etc.  Non-urgent messages can be sent to your provider as well.   To learn more about what you can do with MyChart, go to [email or web address].    Your next appointment:   6 month(s)  The format for your next appointment:   In Person  Provider:    You may see Dr. Garen Lah or one of the following Advanced Practice Providers on your designated Care Team:    Murray Hodgkins, NP  Christell Faith, PA-C  Marrianne Mood, PA-C    Other Instructions N/A

## 2019-12-26 ENCOUNTER — Ambulatory Visit: Payer: Medicare HMO | Attending: Internal Medicine

## 2019-12-26 DIAGNOSIS — Z23 Encounter for immunization: Secondary | ICD-10-CM

## 2019-12-26 NOTE — Progress Notes (Signed)
   Covid-19 Vaccination Clinic  Name:  Monica Guerrero    MRN: [id number] DOB: Dec 18, 1940  12/26/2019  [name] was observed post Covid-19 immunization for 15 minutes without incident. She was provided with Vaccine Information Sheet and instruction to access the V-Safe system.   [name] was instructed to call 911 with any severe reactions post vaccine: Marland Kitchen Difficulty breathing  . Swelling of face and throat  . A fast heartbeat  . A bad rash all over body  . Dizziness and weakness   Immunizations Administered    Name Date Dose VIS Date Route   Pfizer COVID-19 Vaccine 12/26/2019 11:00 AM 0.3 mL 10/30/2018 Intramuscular   Manufacturer: [place]   Lot: [id number]   [place]: [id number]

## 2020-01-11 ENCOUNTER — Ambulatory Visit: Payer: Medicare HMO

## 2020-01-21 ENCOUNTER — Ambulatory Visit: Payer: Medicare HMO | Attending: Internal Medicine

## 2020-01-21 DIAGNOSIS — Z23 Encounter for immunization: Secondary | ICD-10-CM

## 2020-01-21 NOTE — Progress Notes (Signed)
   Covid-19 Vaccination Clinic  Name:  Monica Guerrero    MRN: [id number] DOB: 08/09/1941  01/21/2020  [name] was observed post Covid-19 immunization for 15 minutes without incident. She was provided with Vaccine Information Sheet and instruction to access the V-Safe system.   [name] was instructed to call 911 with any severe reactions post vaccine: Marland Kitchen Difficulty breathing  . Swelling of face and throat  . A fast heartbeat  . A bad rash all over body  . Dizziness and weakness   Immunizations Administered    Name Date Dose VIS Date Route   Pfizer COVID-19 Vaccine 01/21/2020 11:15 AM 0.3 mL 10/30/2018 Intramuscular   Manufacturer: [place]   Lot: [id number]   [place]: [id number]

## 2020-02-04 ENCOUNTER — Other Ambulatory Visit: Payer: Self-pay | Admitting: Family Medicine

## 2020-02-06 DIAGNOSIS — H353132 Nonexudative age-related macular degeneration, bilateral, intermediate dry stage: Secondary | ICD-10-CM | POA: Diagnosis not present

## 2020-02-06 DIAGNOSIS — H401113 Primary open-angle glaucoma, right eye, severe stage: Secondary | ICD-10-CM | POA: Diagnosis not present

## 2020-02-19 DIAGNOSIS — Z0100 Encounter for examination of eyes and vision without abnormal findings: Secondary | ICD-10-CM | POA: Diagnosis not present

## 2020-03-12 ENCOUNTER — Telehealth: Payer: Self-pay

## 2020-03-12 NOTE — Telephone Encounter (Signed)
Tavaborole 5% topical solution PA denied. Patient needs to try and fail Ciclopirox topical solution.

## 2020-03-15 NOTE — Telephone Encounter (Signed)
Send Ciclopirox topical solution w 1 yr rf

## 2020-03-16 MED ORDER — CICLOPIROX 8 % EX SOLN: Freq: Every day | CUTANEOUS | 11 refills | Status: DC

## 2020-03-16 NOTE — Telephone Encounter (Signed)
Prescription sent in and patient advised. 

## 2020-04-08 ENCOUNTER — Encounter: Payer: Self-pay | Admitting: Family Medicine

## 2020-05-06 ENCOUNTER — Telehealth: Payer: Self-pay | Admitting: Family Medicine

## 2020-05-06 ENCOUNTER — Other Ambulatory Visit: Payer: Self-pay

## 2020-05-06 ENCOUNTER — Ambulatory Visit (INDEPENDENT_AMBULATORY_CARE_PROVIDER_SITE_OTHER): Payer: Medicare HMO

## 2020-05-06 DIAGNOSIS — E038 Other specified hypothyroidism: Secondary | ICD-10-CM

## 2020-05-06 DIAGNOSIS — E559 Vitamin D deficiency, unspecified: Secondary | ICD-10-CM

## 2020-05-06 DIAGNOSIS — M810 Age-related osteoporosis without current pathological fracture: Secondary | ICD-10-CM

## 2020-05-06 DIAGNOSIS — I10 Essential (primary) hypertension: Secondary | ICD-10-CM

## 2020-05-06 DIAGNOSIS — Z0000 Encounter for general adult medical examination without abnormal findings: Secondary | ICD-10-CM | POA: Diagnosis not present

## 2020-05-06 DIAGNOSIS — E7800 Pure hypercholesterolemia, unspecified: Secondary | ICD-10-CM

## 2020-05-06 NOTE — Patient Instructions (Signed)
Monica Guerrero , Thank you for taking time to come for your Medicare Wellness Visit. I appreciate your ongoing commitment to your health goals. Please review the following plan we discussed and let me know if I can assist you in the future.   Screening recommendations/referrals: Colonoscopy:Up to date, completed cologuard 04/17/2019, due 04/2022 Mammogram: due, will call and schedule your own appointment Bone Density: due, will call and schedule your own appointment Recommended yearly ophthalmology/optometry visit for glaucoma screening and checkup Recommended yearly dental visit for hygiene and checkup  Vaccinations: Influenza vaccine: due, will get at upcoming office visit Pneumococcal vaccine: Completed series Tdap vaccine: Up to date, completed 08/08/2012, due 08/2022 Shingles vaccine: due, check with your insurance regarding coverage    Covid-19:Completed series  Advanced directives: Please bring a copy of your POA (Power of [place]) and/or Living Will to your next appointment.   Conditions/risks identified: hypertension, hypercholesterolemia  Next appointment: Follow up in one year for your annual wellness visit    Preventive Care 78 Years and Older, Female Preventive care refers to lifestyle choices and visits with your health care provider that can promote health and wellness. What does preventive care include?  A yearly physical exam. This is also called an annual well check.  Dental exams once or twice a year.  Routine eye exams. Ask your health care provider how often you should have your eyes checked.  Personal lifestyle choices, including:  Daily care of your teeth and gums.  Regular physical activity.  Eating a healthy diet.  Avoiding tobacco and drug use.  Limiting alcohol use.  Practicing safe sex.  Taking low-dose aspirin every day.  Taking vitamin and mineral supplements as recommended by your health care provider. What happens during an annual well  check? The services and screenings done by your health care provider during your annual well check will depend on your age, overall health, lifestyle risk factors, and family history of disease. Counseling  Your health care provider may ask you questions about your:  Alcohol use.  Tobacco use.  Drug use.  Emotional well-being.  Home and relationship well-being.  Sexual activity.  Eating habits.  History of falls.  Memory and ability to understand (cognition).  Work and work Statistician.  Reproductive health. Screening  You may have the following tests or measurements:  Height, weight, and BMI.  Blood pressure.  Lipid and cholesterol levels. These may be checked every 5 years, or more frequently if you are over 59 years old.  Skin check.  Lung cancer screening. You may have this screening every year starting at age 52 if you have a 30-pack-year history of smoking and currently smoke or have quit within the past 15 years.  Fecal occult blood test (FOBT) of the stool. You may have this test every year starting at age 40.  Flexible sigmoidoscopy or colonoscopy. You may have a sigmoidoscopy every 5 years or a colonoscopy every 10 years starting at age 17.  Hepatitis C blood test.  Hepatitis B blood test.  Sexually transmitted disease (STD) testing.  Diabetes screening. This is done by checking your blood sugar (glucose) after you have not eaten for a while (fasting). You may have this done every 1-3 years.  Bone density scan. This is done to screen for osteoporosis. You may have this done starting at age 30.  Mammogram. This may be done every 1-2 years. Talk to your health care provider about how often you should have regular mammograms. Talk with your health care  provider about your test results, treatment options, and if necessary, the need for more tests. Vaccines  Your health care provider may recommend certain vaccines, such as:  Influenza vaccine. This is  recommended every year.  Tetanus, diphtheria, and acellular pertussis (Tdap, Td) vaccine. You may need a Td booster every 10 years.  Zoster vaccine. You may need this after age 4.  Pneumococcal 13-valent conjugate (PCV13) vaccine. One dose is recommended after age 21.  Pneumococcal polysaccharide (PPSV23) vaccine. One dose is recommended after age 21. Talk to your health care provider about which screenings and vaccines you need and how often you need them. This information is not intended to replace advice given to you by your health care provider. Make sure you discuss any questions you have with your health care provider. Document Released: 09/18/2015 Document Revised: 05/11/2016 Document Reviewed: 06/23/2015 Elsevier Interactive Patient Education  2017 [place] Prevention in the Home Falls can cause injuries. They can happen to people of all ages. There are many things you can do to make your home safe and to help prevent falls. What can I do on the outside of my home?  Regularly fix the edges of walkways and driveways and fix any cracks.  Remove anything that might make you trip as you walk through a door, such as a raised step or threshold.  Trim any bushes or trees on the path to your home.  Use bright outdoor lighting.  Clear any walking paths of anything that might make someone trip, such as rocks or tools.  Regularly check to see if handrails are loose or broken. Make sure that both sides of any steps have handrails.  Any raised decks and porches should have guardrails on the edges.  Have any leaves, snow, or ice cleared regularly.  Use sand or salt on walking paths during winter.  Clean up any spills in your garage right away. This includes oil or grease spills. What can I do in the bathroom?  Use night lights.  Install grab bars by the toilet and in the tub and shower. Do not use towel bars as grab bars.  Use non-skid mats or decals in the tub or  shower.  If you need to sit down in the shower, use a plastic, non-slip stool.  Keep the floor dry. Clean up any water that spills on the floor as soon as it happens.  Remove soap buildup in the tub or shower regularly.  Attach bath mats securely with double-sided non-slip rug tape.  Do not have throw rugs and other things on the floor that can make you trip. What can I do in the bedroom?  Use night lights.  Make sure that you have a light by your bed that is easy to reach.  Do not use any sheets or blankets that are too big for your bed. They should not hang down onto the floor.  Have a firm chair that has side arms. You can use this for support while you get dressed.  Do not have throw rugs and other things on the floor that can make you trip. What can I do in the kitchen?  Clean up any spills right away.  Avoid walking on wet floors.  Keep items that you use a lot in easy-to-reach places.  If you need to reach something above you, use a strong step stool that has a grab bar.  Keep electrical cords out of the way.  Do not use floor polish  or wax that makes floors slippery. If you must use wax, use non-skid floor wax.  Do not have throw rugs and other things on the floor that can make you trip. What can I do with my stairs?  Do not leave any items on the stairs.  Make sure that there are handrails on both sides of the stairs and use them. Fix handrails that are broken or loose. Make sure that handrails are as long as the stairways.  Check any carpeting to make sure that it is firmly attached to the stairs. Fix any carpet that is loose or worn.  Avoid having throw rugs at the top or bottom of the stairs. If you do have throw rugs, attach them to the floor with carpet tape.  Make sure that you have a light switch at the top of the stairs and the bottom of the stairs. If you do not have them, ask someone to add them for you. What else can I do to help prevent  falls?  Wear shoes that:  Do not have high heels.  Have rubber bottoms.  Are comfortable and fit you well.  Are closed at the toe. Do not wear sandals.  If you use a stepladder:  Make sure that it is fully opened. Do not climb a closed stepladder.  Make sure that both sides of the stepladder are locked into place.  Ask someone to hold it for you, if possible.  Clearly mark and make sure that you can see:  Any grab bars or handrails.  First and last steps.  Where the edge of each step is.  Use tools that help you move around (mobility aids) if they are needed. These include:  Canes.  Walkers.  Scooters.  Crutches.  Turn on the lights when you go into a dark area. Replace any light bulbs as soon as they burn out.  Set up your furniture so you have a clear path. Avoid moving your furniture around.  If any of your floors are uneven, fix them.  If there are any pets around you, be aware of where they are.  Review your medicines with your doctor. Some medicines can make you feel dizzy. This can increase your chance of falling. Ask your doctor what other things that you can do to help prevent falls. This information is not intended to replace advice given to you by your health care provider. Make sure you discuss any questions you have with your health care provider. Document Released: 06/18/2009 Document Revised: 01/28/2016 Document Reviewed: 09/26/2014 Elsevier Interactive Patient Education  2017 [employer].

## 2020-05-06 NOTE — Telephone Encounter (Signed)
-----   Message from Cloyd Stagers, RT sent at 04/20/2020 11:26 AM EDT ----- Regarding: Lab Orders for Thursday 9.2.2021 Please place lab orders for Thursday 9.2.2021, office visit for physical on Friday 9.10.2021 Thank you, Dyke Maes RT(R)

## 2020-05-06 NOTE — Progress Notes (Signed)
PCP notes:  Health Maintenance: Mammogram- due Dexa- due Flu- due   Abnormal Screenings: none   Patient concerns: none   Nurse concerns: none   Next PCP appt.: 05/15/2020 @ 11:30 am

## 2020-05-06 NOTE — Progress Notes (Signed)
Subjective:   Monica Guerrero is a 79 y.o. female who presents for Medicare Annual (Subsequent) preventive examination.  Review of Systems: N/A     I connected with the patient today by telephone and verified that I am speaking with the correct person using two identifiers. Location patient: home Location nurse: work Persons participating in the telephone visit: patient, nurse.   I discussed the limitations, risks, security and privacy concerns of performing an evaluation and management service by telephone and the availability of in person appointments. I also discussed with the patient that there may be a patient responsible charge related to this service. The patient expressed understanding and verbally consented to this telephonic visit.        Cardiac Risk Factors include: advanced age (>82men, >12 women);hypertension;Other (see comment), Risk factor comments: hypercholesterolemia     Objective:    Today's Vitals   There is no height or weight on file to calculate BMI.  Advanced Directives 05/06/2020 12/28/2017 12/22/2016  Does Patient Have a Medical Advance Directive? Yes Yes Yes  Type of Paramedic of [place];Living will [place];Living will [place];Living will  Does patient want to make changes to medical advance directive? - No - Patient declined -  Copy of [place] in Chart? No - copy requested Yes No - copy requested  Would patient like information on creating a medical advance directive? - No - Patient declined -    Current Medications (verified) Outpatient Encounter Medications as of 05/06/2020  Medication Sig  . acetaminophen (TYLENOL ARTHRITIS PAIN) 650 MG CR tablet Take 650 mg by mouth every 8 (eight) hours as needed. Alternates with Ibuprofen.   Marland Kitchen apixaban (ELIQUIS) 5 MG TABS tablet Take 1 tablet (5 mg total) by mouth 2 (two) times daily.  . bisoprolol-hydrochlorothiazide (ZIAC)  2.5-6.25 MG tablet Take 1 tablet by mouth daily.  . calcium carbonate (OS-CAL) 600 MG TABS Take 600 mg by mouth daily.   . chlorpheniramine (CHLOR-TRIMETON) 4 MG tablet Take 4 mg by mouth 2 (two) times daily as needed.    . cholecalciferol (VITAMIN D) 1000 UNITS tablet Take 2,000 Units by mouth daily.  . ciclopirox (PENLAC) 8 % solution Apply topically at bedtime. Apply over nail and surrounding skin. Apply daily over previous coat.  Marland Kitchen GARLIC PO Take 1 capsule by mouth daily.  . hydrochlorothiazide (HYDRODIURIL) 25 MG tablet TAKE 1 TABLET EVERY DAY  . ketoconazole (NIZORAL) 2 % cream Apply 1 application topically at bedtime.  Marland Kitchen Lifitegrast (XIIDRA) 5 % SOLN Place 1 drop into both eyes 2 (two) times a day.  . loratadine (CLARITIN) 10 MG tablet Take 10 mg by mouth as directed.    . Multiple Vitamins-Minerals (PRESERVISION AREDS 2) CAPS Take 1 capsule by mouth 2 (two) times a day.  . phenylephrine (NEO-SYNEPHRINE) 0.125 % nasal drops Place 1 drop into the nose as needed.  Vladimir Faster Glycol-Propyl Glycol (SYSTANE OP) Apply to eye 2 (two) times daily as needed.  Corrie Dandy (KERYDIN) 5 % SOLN Apply 1 application topically daily. Apply to affected toenails qd  . timolol (BETIMOL) 0.5 % ophthalmic solution Place 1 drop into both eyes daily.   . timolol (TIMOPTIC) 0.5 % ophthalmic solution   . verapamil (CALAN-SR) 240 MG CR tablet Take 1 tablet (240 mg total) by mouth at bedtime.   No facility-administered encounter medications on file as of 05/06/2020.    Allergies (verified) Patient has no known allergies.  History: Past Medical History:  Diagnosis Date  . Allergy   . Arthritis    Osteoarthritis  . Basal cell carcinoma 12/06/2016   L ant lat neck anteriro  . Basal cell carcinoma 10/20/2016   L ant lat neck posterior  . Cancer ([place])    skin  . Hypertension   . Osteoporosis   . 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 BASAL CELL CARCINOMA EXCISION  12/2016   neck  . EYE SURGERY     Bilateral cataract Surgery  . SQUAMOUS CELL CARCINOMA EXCISION  12/2016   neck  . TONSILLECTOMY     Family History  Problem Relation Age of Onset  . Cancer Father        Kidney  . Heart disease Father   . Cancer Paternal Grandmother   . Dementia Mother   . Hypertension Brother   . Heart disease Maternal Uncle   . Stroke Maternal Grandmother   . Heart disease Maternal Grandfather   . Breast cancer Neg Hx    Social History   Socioeconomic History  . Marital status: Single    Spouse name: Not on file  . Number of children: 2  . Years of education: Not on file  . Highest education level: Not on file  Occupational History  . Occupation: Bank Teller  Tobacco Use  . Smoking status: Never Smoker  . Smokeless tobacco: Never Used  Vaping Use  . Vaping Use: Never used  Substance and Sexual Activity  . Alcohol use: No    Alcohol/week: 0.0 standard drinks  . Drug use: No  . Sexual activity: Not Currently  Other Topics Concern  . Not on file  Social History Narrative  . Not on file   Social Determinants of Health   Financial Resource Strain: Low Risk   . Difficulty of Paying Living Expenses: Not hard at all  Food Insecurity: No Food Insecurity  . Worried About Charity fundraiser in the Last Year: Never true  . Ran Out of Food in the Last Year: Never true  Transportation Needs: No Transportation Needs  . Lack of Transportation (Medical): No  . Lack of Transportation (Non-Medical): No  Physical Activity: Inactive  . Days of Exercise per Week: 0 days  . Minutes of Exercise per Session: 0 min  Stress: No Stress Concern Present  . Feeling of Stress : Not at all  Social Connections:   . Frequency of Communication with Friends and Family: Not on file  . Frequency of Social Gatherings with Friends and Family: Not on file  . Attends Religious Services: Not on file  .  Active Member of Clubs or Organizations: Not on file  . Attends Archivist Meetings: Not on file  . Marital Status: Not on file    Tobacco Counseling Counseling given: Not Answered   Clinical Intake:  Pre-visit preparation completed: Yes  Pain : No/denies pain     Nutritional Risks: None Diabetes: No  How often do you need to have someone help you when you read instructions, pamphlets, or other written materials from your doctor or pharmacy?: 1 - Never What is the last grade level you completed in school?: some college  Diabetic: No Nutrition Risk Assessment:  Has the patient had any N/V/D within the last 2 months?  No  Does the patient have any non-healing wounds?  No  Has the patient had any unintentional weight loss or weight gain?  No   Diabetes:  Is the patient diabetic?  No  If diabetic, was a CBG obtained today?  N/A Did the patient bring in their glucometer from home?  N/A How often do you monitor your CBG's? N/A.   Financial Strains and Diabetes Management:  Are you having any financial strains with the device, your supplies or your medication? N/A.  Does the patient want to be seen by Chronic Care Management for management of their diabetes?  N/A Would the patient like to be referred to a Nutritionist or for Diabetic Management?  N/A     Interpreter Needed?: No  Information entered by :: CJohnson, LPN   Activities of Daily Living In your present state of health, do you have any difficulty performing the following activities: 05/06/2020  Hearing? Y  Comment some hearing loss noted  Vision? Y  Comment has macular degeneration, glaucoma and dry eyes  Difficulty concentrating or making decisions? N  Walking or climbing stairs? N  Dressing or bathing? N  Doing errands, shopping? N  Preparing Food and eating ? N  Using the Toilet? N  In the past six months, have you accidently leaked urine? N  Do you have problems with loss of bowel control?  N  Managing your Medications? N  Managing your Finances? N  Housekeeping or managing your Housekeeping? N  Some recent data might be hidden    Patient Care Team: Tower, Wynelle Fanny, MD as PCP - General Leandrew Koyanagi, MD as Referring Physician (Ophthalmology)  Indicate any recent Medical Services you may have received from other than [facility] providers in the past year (date may be approximate).     Assessment:   This is a routine wellness examination for Doryce.  Hearing/Vision screen  Hearing Screening   125Hz  250Hz  500Hz  1000Hz  2000Hz  3000Hz  4000Hz  6000Hz  8000Hz   Right ear:           Left ear:           Vision Screening Comments: Patient gets annual eye exams   Dietary issues and exercise activities discussed: Current Exercise Habits: The patient does not participate in regular exercise at present, Exercise limited by: None identified  Goals    . DIET - INCREASE WATER INTAKE     Starting 12/28/2017, I will attempt to drink at least 6-8 glasses of water daily.     . Increase physical activity     When schedule permits, I plan to resume going to gym for 60 min at least twice weekly.     . Patient Stated     05/06/2020, I will maintain and continue medications as prescribed.      Depression Screen PHQ 2/9 Scores 05/06/2020 02/04/2019 12/28/2017 12/22/2016 12/08/2015 10/31/2014 09/13/2013  PHQ - 2 Score 0 0 0 0 0 0 0  PHQ- 9 Score 0 - 0 - - - -    Fall Risk Fall Risk  05/06/2020 03/28/2019 12/28/2017 12/22/2016 12/08/2015  Falls in the past year? 1 0 Yes No Yes  Comment tripped over something - foot became tangled in dog leash; fall caused injury to right hand; no medical treatment - -  Number falls in past yr: 0 0 1 - 1  Injury with Fall? 0 0 Yes - No  Risk for fall due to : Medication side effect - - - -  Follow up Falls evaluation completed;Falls prevention discussed Falls evaluation completed - - -  Any stairs in or around the home? Yes  If so, are there any without handrails? No    Home free of loose throw rugs in walkways, pet beds, electrical cords, etc? Yes  Adequate lighting in your home to reduce risk of falls? Yes   ASSISTIVE DEVICES UTILIZED TO PREVENT FALLS:  Life alert? No  Use of a cane, walker or w/c? No  Grab bars in the bathroom? No  Shower chair or bench in shower? No  Elevated toilet seat or a handicapped toilet? No   TIMED UP AND GO:  Was the test performed? N/A, telephonic visit .    Cognitive Function: MMSE - Mini Mental State Exam 05/06/2020 12/28/2017 12/22/2016  Orientation to time 5 5 5   Orientation to Place 5 5 5   Registration 3 3 3   Attention/ Calculation 5 0 0  Recall 3 3 3   Language- name 2 objects - 0 0  Language- repeat 1 1 1   Language- follow 3 step command - 3 3  Language- read & follow direction - 0 0  Write a sentence - 0 0  Copy design - 0 0  Total score - 20 20  Mini Cog  Mini-Cog screen was completed. Maximum score is 22. A value of 0 denotes this part of the MMSE was not completed or the patient failed this part of the Mini-Cog screening.       Immunizations Immunization History  Administered Date(s) Administered  . Fluad Quad(high Dose 65+) 05/30/2019  . Influenza Split 06/14/2012  . Influenza Whole 06/22/2007, 06/02/2008, 06/10/2009, 06/09/2010  . Influenza,inj,Quad PF,6+ Mos 05/30/2013, 06/16/2014, 06/19/2015, 06/07/2016, 06/08/2017, 06/14/2018  . PFIZER SARS-COV-2 Vaccination 12/26/2019, 01/21/2020  . PPD Test 09/13/2013, 11/05/2013  . Pneumococcal Conjugate-13 10/31/2014  . Pneumococcal Polysaccharide-23 04/19/2011  . Td 09/05/2002, 08/08/2012    TDAP status: Up to date Flu Vaccine status: due, will get at upcoming office visit  Pneumococcal vaccine status: Up to date Covid-19 vaccine status: Completed vaccines  Qualifies for Shingles Vaccine? Yes   Zostavax completed No   Shingrix Completed?: No.    Education has been provided regarding the importance of this vaccine. Patient has been advised to  call insurance company to determine out of pocket expense if they have not yet received this vaccine. Advised may also receive vaccine at local pharmacy or Health Dept. Verbalized acceptance and understanding.  Screening Tests Health Maintenance  Topic Date Due  . Hepatitis C Screening  Never done  . INFLUENZA VACCINE  04/05/2020  . MAMMOGRAM  04/24/2020  . TETANUS/TDAP  08/08/2022  . DEXA SCAN  Completed  . COVID-19 Vaccine  Completed  . PNA vac Low Risk Adult  Completed    Health Maintenance  Health Maintenance Due  Topic Date Due  . Hepatitis C Screening  Never done  . INFLUENZA VACCINE  04/05/2020  . MAMMOGRAM  04/24/2020    Colorectal cancer screening: Completed cologuard 04/17/2019. Repeat every 3 years Mammogram status: due, will schedule her own appointment Bone Density status: due, will schedule her own appointment   Lung Cancer Screening: (Low Dose CT Chest recommended if Age 61-80 years, 30 pack-year currently smoking OR have quit w/in 15years.) does not qualify.    Additional Screening:  Hepatitis C Screening: does qualify; Completed due  Vision Screening: Recommended annual ophthalmology exams for early detection of glaucoma and other disorders of the eye. Is the patient up to date with their annual eye exam?  Yes  Who is the provider or what is the  name of the office in which the patient attends annual eye exams? [facility] If pt is not established with a provider, would they like to be referred to a provider to establish care? No .   Dental Screening: Recommended annual dental exams for proper oral hygiene  Community Resource Referral / Chronic Care Management: CRR required this visit?  No   CCM required this visit?  No      Plan:     I have personally reviewed and noted the following in the patient's chart:   . Medical and social history . Use of alcohol, tobacco or illicit drugs  . Current medications and supplements . Functional  ability and status . Nutritional status . Physical activity . Advanced directives . List of other physicians . Hospitalizations, surgeries, and ER visits in previous 12 months . Vitals . Screenings to include cognitive, depression, and falls . Referrals and appointments  In addition, I have reviewed and discussed with patient certain preventive protocols, quality metrics, and best practice recommendations. A written personalized care plan for preventive services as well as general preventive health recommendations were provided to patient.   Due to this being a telephonic visit, the after visit summary with patients personalized plan was offered to patient via mail or my-chart. Patient preferred to pick up at office at next visit.   Andrez Grime, LPN   10/11/4156

## 2020-05-07 ENCOUNTER — Other Ambulatory Visit (INDEPENDENT_AMBULATORY_CARE_PROVIDER_SITE_OTHER): Payer: Medicare HMO

## 2020-05-07 ENCOUNTER — Other Ambulatory Visit: Payer: Self-pay

## 2020-05-07 ENCOUNTER — Ambulatory Visit: Payer: Medicare HMO

## 2020-05-07 DIAGNOSIS — E039 Hypothyroidism, unspecified: Secondary | ICD-10-CM

## 2020-05-07 DIAGNOSIS — I10 Essential (primary) hypertension: Secondary | ICD-10-CM | POA: Diagnosis not present

## 2020-05-07 DIAGNOSIS — E559 Vitamin D deficiency, unspecified: Secondary | ICD-10-CM | POA: Diagnosis not present

## 2020-05-07 DIAGNOSIS — E7800 Pure hypercholesterolemia, unspecified: Secondary | ICD-10-CM | POA: Diagnosis not present

## 2020-05-07 DIAGNOSIS — E038 Other specified hypothyroidism: Secondary | ICD-10-CM

## 2020-05-07 LAB — CBC WITH DIFFERENTIAL/PLATELET
Basophils Absolute: 0 10*3/uL (ref 0.0–0.1)
Basophils Relative: 0.3 % (ref 0.0–3.0)
Eosinophils Absolute: 0.1 10*3/uL (ref 0.0–0.7)
Eosinophils Relative: 2.2 % (ref 0.0–5.0)
HCT: 41.7 % (ref 36.0–46.0)
Hemoglobin: 13.9 g/dL (ref 12.0–15.0)
Lymphocytes Relative: 28.9 % (ref 12.0–46.0)
Lymphs Abs: 1.4 10*3/uL (ref 0.7–4.0)
MCHC: 33.2 g/dL (ref 30.0–36.0)
MCV: 89.8 fl (ref 78.0–100.0)
Monocytes Absolute: 0.4 10*3/uL (ref 0.1–1.0)
Monocytes Relative: 9.1 % (ref 3.0–12.0)
Neutro Abs: 2.8 10*3/uL (ref 1.4–7.7)
Neutrophils Relative %: 59.5 % (ref 43.0–77.0)
Platelets: 216 10*3/uL (ref 150.0–400.0)
RBC: 4.64 Mil/uL (ref 3.87–5.11)
RDW: 13.3 % (ref 11.5–15.5)
WBC: 4.8 10*3/uL (ref 4.0–10.5)

## 2020-05-07 LAB — COMPREHENSIVE METABOLIC PANEL
ALT: 14 U/L (ref 0–35)
AST: 18 U/L (ref 0–37)
Albumin: 4.3 g/dL (ref 3.5–5.2)
Alkaline Phosphatase: 58 U/L (ref 39–117)
BUN: 16 mg/dL (ref 6–23)
CO2: 33 mEq/L — ABNORMAL HIGH (ref 19–32)
Calcium: 9.6 mg/dL (ref 8.4–10.5)
Chloride: 101 mEq/L (ref 96–112)
Creatinine, Ser: 0.79 mg/dL (ref 0.40–1.20)
GFR: 70.29 mL/min (ref >60.00)
Glucose, Bld: 98 mg/dL (ref 70–99)
Potassium: 3.4 mEq/L — ABNORMAL LOW (ref 3.5–5.1)
Sodium: 142 mEq/L (ref 135–145)
Total Bilirubin: 0.6 mg/dL (ref 0.2–1.2)
Total Protein: 6.4 g/dL (ref 6.0–8.3)

## 2020-05-07 LAB — VITAMIN D 25 HYDROXY (VIT D DEFICIENCY, FRACTURES): VITD: 37.61 ng/mL (ref 30.00–100.00)

## 2020-05-07 LAB — LIPID PANEL
Cholesterol: 219 mg/dL — ABNORMAL HIGH (ref 0–200)
HDL: 50.1 mg/dL (ref >39.00)
LDL Cholesterol: 142 mg/dL — ABNORMAL HIGH (ref 0–99)
NonHDL: 168.64
Total CHOL/HDL Ratio: 4
Triglycerides: 134 mg/dL (ref 0.0–149.0)
VLDL: 26.8 mg/dL (ref 0.0–40.0)

## 2020-05-07 LAB — T4, FREE: Free T4: 0.93 ng/dL (ref 0.60–1.60)

## 2020-05-07 LAB — TSH: TSH: 3.49 u[IU]/mL (ref 0.35–4.50)

## 2020-05-08 ENCOUNTER — Other Ambulatory Visit: Payer: Self-pay | Admitting: Family Medicine

## 2020-05-09 ENCOUNTER — Other Ambulatory Visit: Payer: Self-pay | Admitting: Family Medicine

## 2020-05-15 ENCOUNTER — Other Ambulatory Visit: Payer: Self-pay

## 2020-05-15 ENCOUNTER — Ambulatory Visit (INDEPENDENT_AMBULATORY_CARE_PROVIDER_SITE_OTHER): Payer: Medicare HMO | Admitting: Family Medicine

## 2020-05-15 ENCOUNTER — Encounter: Payer: Self-pay | Admitting: Family Medicine

## 2020-05-15 VITALS — BP 130/80 | HR 51 | Temp 97.3°F | Ht 62.75 in | Wt 181.1 lb

## 2020-05-15 DIAGNOSIS — Z0000 Encounter for general adult medical examination without abnormal findings: Secondary | ICD-10-CM

## 2020-05-15 DIAGNOSIS — E039 Hypothyroidism, unspecified: Secondary | ICD-10-CM | POA: Diagnosis not present

## 2020-05-15 DIAGNOSIS — E7800 Pure hypercholesterolemia, unspecified: Secondary | ICD-10-CM | POA: Diagnosis not present

## 2020-05-15 DIAGNOSIS — E559 Vitamin D deficiency, unspecified: Secondary | ICD-10-CM | POA: Diagnosis not present

## 2020-05-15 DIAGNOSIS — Z23 Encounter for immunization: Secondary | ICD-10-CM | POA: Diagnosis not present

## 2020-05-15 DIAGNOSIS — E038 Other specified hypothyroidism: Secondary | ICD-10-CM

## 2020-05-15 DIAGNOSIS — I480 Paroxysmal atrial fibrillation: Secondary | ICD-10-CM | POA: Insufficient documentation

## 2020-05-15 DIAGNOSIS — M810 Age-related osteoporosis without current pathological fracture: Secondary | ICD-10-CM

## 2020-05-15 DIAGNOSIS — E669 Obesity, unspecified: Secondary | ICD-10-CM

## 2020-05-15 DIAGNOSIS — I10 Essential (primary) hypertension: Secondary | ICD-10-CM

## 2020-05-15 MED ORDER — HYDROCHLOROTHIAZIDE 25 MG PO TABS
25.0000 mg | ORAL_TABLET | Freq: Every day | ORAL | 3 refills | Status: DC
Start: 2020-05-15 — End: 2021-05-20

## 2020-05-15 MED ORDER — VERAPAMIL HCL ER 240 MG PO TBCR
240.0000 mg | EXTENDED_RELEASE_TABLET | Freq: Every day | ORAL | 3 refills | Status: DC
Start: 2020-05-15 — End: 2020-06-04

## 2020-05-15 MED ORDER — BISOPROLOL-HYDROCHLOROTHIAZIDE 2.5-6.25 MG PO TABS
1.0000 | ORAL_TABLET | Freq: Every day | ORAL | 3 refills | Status: DC
Start: 2020-05-15 — End: 2020-11-16

## 2020-05-15 NOTE — Progress Notes (Signed)
Subjective:    Patient ID: Monica Guerrero, female    DOB: 1941-07-05, 79 y.o.   MRN: [id number]  This visit occurred during the SARS-CoV-2 public health emergency.  Safety protocols were in place, including screening questions prior to the visit, additional usage of staff PPE, and extensive cleaning of exam room while observing appropriate contact time as indicated for disinfecting solutions.    HPI  Here for health maintenance exam and to review chronic medical problems    Wt Readings from Last 3 Encounters:  05/15/20 181 lb 1 oz (82.1 kg)  12/06/19 186 lb (84.4 kg)  09/16/19 187 lb (84.8 kg)  wt is down 5 lb  Trying to limit portion sizes - doing better  No regular exercise    (does chase children)  32.33 kg/m   Feeling fine  Doing well with a fib   Had amw on 9/1 Noted mammo/dexa due and flu shot  She is covid immunized  Zoster status -had zostavax -may be interested in shingrix   Mammogram 8/20-will schedule  Self breast exam -no lumps   dexa 6/19 -osteoporosis  Has taken a course of evista in the past  (unsure if she would consider treatment)  Falls-tripped over something and fell  She worries about falls  Fractures none  Supplements- taking d  Vit D level is 37.6 Exercise - not now   Colon screening -colonoscopy 7/11 reported normal  cologuard done 4/17 -normal and then negative again 8/20   HTN bp is stable today  No cp or palpitations or headaches or edema  No side effects to medicines  BP Readings from Last 3 Encounters:  05/15/20 130/80  12/06/19 130/80  09/16/19 130/76     Pulse Readings from Last 3 Encounters:  05/15/20 (!) 51  12/06/19 60  09/16/19 (!) 55   Seeing cardiology for paroxysmal a fib  Taking eliquis for anticoag   Lab Results  Component Value Date   CREATININE 0.79 05/07/2020   BUN 16 05/07/2020   NA 142 05/07/2020   K 3.4 (L) 05/07/2020   CL 101 05/07/2020   CO2 33 (H) 05/07/2020   She does take hctz  Lab Results    Component Value Date   CREATININE 0.79 05/07/2020   BUN 16 05/07/2020   NA 142 05/07/2020   K 3.4 (L) 05/07/2020   CL 101 05/07/2020   CO2 33 (H) 05/07/2020     Subclinical hypothyroidism  Lab Results  Component Value Date   TSH 3.49 05/07/2020   Free T4 nl at 0.93  Hyperlipidemia Lab Results  Component Value Date   CHOL 219 (H) 05/07/2020   CHOL 233 (H) 03/19/2019   CHOL 220 (H) 12/28/2017   Lab Results  Component Value Date   HDL 50.10 05/07/2020   HDL 62.30 03/19/2019   HDL 62.00 12/28/2017   Lab Results  Component Value Date   LDLCALC 142 (H) 05/07/2020   LDLCALC 154 (H) 03/19/2019   LDLCALC 140 (H) 12/28/2017   Lab Results  Component Value Date   TRIG 134.0 05/07/2020   TRIG 84.0 03/19/2019   TRIG 88.0 12/28/2017   Lab Results  Component Value Date   CHOLHDL 4 05/07/2020   CHOLHDL 4 03/19/2019   CHOLHDL 4 12/28/2017   Lab Results  Component Value Date   LDLDIRECT 175.7 09/09/2013   LDLDIRECT 184.5 08/01/2012   LDLDIRECT 169.6 10/20/2011    No big changes  Never took cholesterol medication  Eats very little fatty food  or red meat  Eats salmon and tuna  Father had heart problems at a young age     Hearing screen   Hearing Screening   125Hz  250Hz  500Hz  1000Hz  2000Hz  3000Hz  4000Hz  6000Hz  8000Hz   Right ear:  20 25 40 0  0    Left ear:  20 20 40 40  0    Vision Screening Comments: Last eye exam June 2021 at [facility]   She does struggle with hearing at times (when in background noise)  May consider hearing aides    Patient Active Problem List   Diagnosis Date Noted   Paroxysmal atrial fibrillation ([place]) 05/15/2020   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   Obesity (BMI 30-39.9) 12/27/2016   Routine general medical examination at a health care facility 12/08/2015   GERD (gastroesophageal reflux disease) 12/08/2015   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   Estrogen deficiency 10/31/2014   Vitamin D deficiency 10/31/2014    Shoulder pain, right 10/22/2014   Skin cancer screening 09/23/2013   Encounter for Medicare annual wellness exam 09/13/2013   Screening mammogram, encounter for 04/19/2011   IRRITABLE BOWEL SYNDROME 01/22/2010   Palpitations 12/18/2008   HYPERCHOLESTEROLEMIA 10/03/2007   GLAUCOMA 10/03/2007   Essential hypertension 10/03/2007   ALLERGIC RHINITIS 10/03/2007   OSTEOARTHRITIS 10/03/2007   Osteoporosis 10/03/2007   Past Medical History:  Diagnosis Date   Allergy    Arthritis    Osteoarthritis   Basal cell carcinoma 12/06/2016   L ant lat neck anteriro   Basal cell carcinoma 10/20/2016   L ant lat neck posterior   Cancer ([place])    skin   Hypertension    Osteoporosis    Squamous cell carcinoma of skin 11/23/2017   L neck lat near angle of mandible (SCCIS)   Past Surgical History:  Procedure Laterality Date   BASAL CELL CARCINOMA EXCISION  12/2016   neck   EYE SURGERY     Bilateral cataract Surgery   SQUAMOUS CELL CARCINOMA EXCISION  12/2016   neck   TONSILLECTOMY     Social History   Tobacco Use   Smoking status: Never Smoker   Smokeless tobacco: Never Used  Vaping Use   Vaping Use: Never used  Substance Use Topics   Alcohol use: No    Alcohol/week: 0.0 standard drinks   Drug use: No   Family History  Problem Relation Age of Onset   Cancer Father        Kidney   Heart disease Father    Cancer Paternal Grandmother    Dementia Mother    Hypertension Brother    Heart disease Maternal Uncle    Stroke Maternal Grandmother    Heart disease Maternal Grandfather    Breast cancer Neg Hx    No Known Allergies Current Outpatient Medications on File Prior to Visit  Medication Sig Dispense Refill   acetaminophen (TYLENOL ARTHRITIS PAIN) 650 MG CR tablet Take 650 mg by mouth every 8 (eight) hours as needed. Alternates with Ibuprofen.      apixaban (ELIQUIS) 5 MG TABS tablet Take 1 tablet (5 mg total) by mouth 2 (two) times  daily. 180 tablet 1   calcium carbonate (OS-CAL) 600 MG TABS Take 600 mg by mouth daily.      chlorpheniramine (CHLOR-TRIMETON) 4 MG tablet Take 4 mg by mouth 2 (two) times daily as needed.       cholecalciferol (VITAMIN D) 1000 UNITS tablet Take 2,000 Units by mouth daily.     ciclopirox (PENLAC) 8 %  solution Apply topically at bedtime. Apply over nail and surrounding skin. Apply daily over previous coat. 6.6 mL 11   GARLIC PO Take 1 capsule by mouth daily.     ketoconazole (NIZORAL) 2 % cream Apply 1 application topically at bedtime. 60 g 3   Lifitegrast (XIIDRA) 5 % SOLN Place 1 drop into both eyes 2 (two) times a day.     loratadine (CLARITIN) 10 MG tablet Take 10 mg by mouth as directed.       Multiple Vitamins-Minerals (PRESERVISION AREDS 2) CAPS Take 1 capsule by mouth 2 (two) times a day.     phenylephrine (NEO-SYNEPHRINE) 0.125 % nasal drops Place 1 drop into the nose as needed.     Polyethyl Glycol-Propyl Glycol (SYSTANE OP) Apply to eye 2 (two) times daily as needed.     timolol (BETIMOL) 0.5 % ophthalmic solution Place 1 drop into both eyes daily.      No current facility-administered medications on file prior to visit.     Review of Systems  Constitutional: Negative for activity change, appetite change, fatigue, fever and unexpected weight change.  HENT: Negative for congestion, ear pain, rhinorrhea, sinus pressure and sore throat.   Eyes: Negative for pain, redness and visual disturbance.  Respiratory: Negative for cough, shortness of breath and wheezing.   Cardiovascular: Negative for chest pain and palpitations.  Gastrointestinal: Negative for abdominal pain, blood in stool, constipation and diarrhea.  Endocrine: Negative for polydipsia and polyuria.  Genitourinary: Negative for dysuria, frequency and urgency.  Musculoskeletal: Negative for arthralgias, back pain and myalgias.  Skin: Negative for pallor and rash.  Allergic/Immunologic: Negative for  environmental allergies.  Neurological: Negative for dizziness, syncope and headaches.  Hematological: Negative for adenopathy. Does not bruise/bleed easily.  Psychiatric/Behavioral: Negative for decreased concentration and dysphoric mood. The patient is not nervous/anxious.        Objective:   Physical Exam Constitutional:      General: She is not in acute distress.    Appearance: Normal appearance. She is well-developed. She is obese. She is not ill-appearing or diaphoretic.  HENT:     Head: Normocephalic and atraumatic.     Right Ear: Tympanic membrane, ear canal and external ear normal.     Left Ear: Tympanic membrane, ear canal and external ear normal.     Nose: Nose normal. No congestion.     Mouth/Throat:     Mouth: Mucous membranes are moist.     Pharynx: Oropharynx is clear. No posterior oropharyngeal erythema.  Eyes:     General: No scleral icterus.    Extraocular Movements: Extraocular movements intact.     Conjunctiva/sclera: Conjunctivae normal.     Pupils: Pupils are equal, round, and reactive to light.  Neck:     Thyroid: No thyromegaly.     Vascular: No carotid bruit or JVD.  Cardiovascular:     Rate and Rhythm: Normal rate and regular rhythm.     Pulses: Normal pulses.     Heart sounds: Normal heart sounds. No gallop.   Pulmonary:     Effort: Pulmonary effort is normal. No respiratory distress.     Breath sounds: Normal breath sounds. No wheezing.     Comments: Good air exch Chest:     Chest wall: No tenderness.  Abdominal:     General: Bowel sounds are normal. There is no distension or abdominal bruit.     Palpations: Abdomen is soft. There is no mass.     Tenderness: There is no  abdominal tenderness.     Hernia: No hernia is present.  Genitourinary:    Comments: Breast exam: No mass, nodules, thickening, tenderness, bulging, retraction, inflamation, nipple discharge or skin changes noted.  No axillary or clavicular LA.     Musculoskeletal:         General: No tenderness. Normal range of motion.     Cervical back: Normal range of motion and neck supple. No rigidity. No muscular tenderness.     Right lower leg: No edema.     Left lower leg: No edema.  Lymphadenopathy:     Cervical: No cervical adenopathy.  Skin:    General: Skin is warm and dry.     Coloration: Skin is not pale.     Findings: No erythema or rash.     Comments: Solar lentigines diffusely Some sks  Neurological:     Mental Status: She is alert. Mental status is at baseline.     Cranial Nerves: No cranial nerve deficit.     Motor: No abnormal muscle tone.     Coordination: Coordination normal.     Gait: Gait normal.     Deep Tendon Reflexes: Reflexes are normal and symmetric. Reflexes normal.  Psychiatric:        Mood and Affect: Mood normal.        Cognition and Memory: Cognition and memory normal.           Assessment & Plan:   Problem List Items Addressed This Visit      Cardiovascular and Mediastinum   Essential hypertension    bp in fair control at this time  BP Readings from Last 1 Encounters:  05/15/20 130/80   No changes needed Most recent labs reviewed  Disc lifstyle change with low sodium diet and exercise        Relevant Medications   bisoprolol-hydrochlorothiazide (ZIAC) 2.5-6.25 MG tablet   hydrochlorothiazide (HYDRODIURIL) 25 MG tablet   verapamil (CALAN-SR) 240 MG CR tablet   Paroxysmal atrial fibrillation ([facility])    Doing well under cardiology care  HR is controlled  Tolerating eliquis      Relevant Medications   bisoprolol-hydrochlorothiazide (ZIAC) 2.5-6.25 MG tablet   hydrochlorothiazide (HYDRODIURIL) 25 MG tablet   verapamil (CALAN-SR) 240 MG CR tablet     Endocrine   Subclinical hypothyroidism    TSH and free T4 are normal today        Musculoskeletal and Integument   Osteoporosis    dexa 6/19 rev Pt wants to hold off on more imaging or treatment at this time  No fractures  One fall/discussed fall prevention    Taking D, level is tx  Disc options for exercise /she has resistance bands        Other   HYPERCHOLESTEROLEMIA    Disc goals for lipids and reasons to control them Rev last labs with pt Rev low sat fat diet in detail LDL in 140s Pt wants to avoid more mediation  Diet handout given      Relevant Medications   bisoprolol-hydrochlorothiazide (ZIAC) 2.5-6.25 MG tablet   hydrochlorothiazide (HYDRODIURIL) 25 MG tablet   verapamil (CALAN-SR) 240 MG CR tablet   Vitamin D deficiency    Level of 37 with current supplementation  Vitamin D level is therapeutic with current supplementation Disc importance of this to bone and overall health       Routine general medical examination at a health care facility - Primary    Reviewed health habits including diet and exercise  and skin cancer prevention Reviewed appropriate screening tests for age  Also reviewed health mt list, fam hx and immunization status , as well as social and family history   See HPI amw reviewed Pt will schedule her own mammogram  Flu shot given Declines dexa utd with cologuard screening        Obesity (BMI 30-39.9)    Discussed how this problem influences overall health and the risks it imposes  Reviewed plan for weight loss with lower calorie diet (via better food choices and also portion control or program like weight watchers) and exercise building up to or more than 30 minutes 5 days per week including some aerobic activity          Other Visit Diagnoses    Need for influenza vaccination       Relevant Orders   Flu Vaccine QUAD High Dose(Fluad) (Completed)

## 2020-05-15 NOTE — Assessment & Plan Note (Signed)
Doing well under cardiology care  HR is controlled  Tolerating eliquis

## 2020-05-15 NOTE — Assessment & Plan Note (Signed)
Discussed how this problem influences overall health and the risks it imposes  Reviewed plan for weight loss with lower calorie diet (via better food choices and also portion control or program like weight watchers) and exercise building up to or more than 30 minutes 5 days per week including some aerobic activity    

## 2020-05-15 NOTE — Assessment & Plan Note (Signed)
bp in fair control at this time  BP Readings from Last 1 Encounters:  05/15/20 130/80   No changes needed Most recent labs reviewed  Disc lifstyle change with low sodium diet and exercise

## 2020-05-15 NOTE — Assessment & Plan Note (Signed)
dexa 6/19 rev Pt wants to hold off on more imaging or treatment at this time  No fractures  One fall/discussed fall prevention  Taking D, level is tx  Disc options for exercise /she has resistance bands

## 2020-05-15 NOTE — Assessment & Plan Note (Signed)
Reviewed health habits including diet and exercise and skin cancer prevention Reviewed appropriate screening tests for age  Also reviewed health mt list, fam hx and immunization status , as well as social and family history   See HPI amw reviewed Pt will schedule her own mammogram  Flu shot given Declines dexa utd with cologuard screening

## 2020-05-15 NOTE — Assessment & Plan Note (Signed)
Level of 37 with current supplementation  Vitamin D level is therapeutic with current supplementation Disc importance of this to bone and overall health

## 2020-05-15 NOTE — Assessment & Plan Note (Signed)
TSH and free T4 are normal today

## 2020-05-15 NOTE — Patient Instructions (Addendum)
If you can add exercise- it would help bone strength  Walking is good  Resistance training with bands also  Chair yoga is great also - helps balance and strength   If you would be open to medication to prevent fractures- then we should get another bone density test   If you are interested in the new shingles vaccine (Shingrix) - call your local pharmacy to check on coverage and availability  If affordable, get on a wait list at your pharmacy to get the vaccine.  Please call and schedule mammogram   Eat some high potassium foods like bananas and dark leafy greens (your level is just slightly low)   For cholesterol Avoid red meat/ fried foods/ egg yolks/ fatty breakfast meats/ butter, cheese and high fat dairy/ and shellfish   You qualify for a very low dose of statin medication for cholesterol (like generic crestor) - let me know if you are interested

## 2020-05-15 NOTE — Assessment & Plan Note (Signed)
Disc goals for lipids and reasons to control them Rev last labs with pt Rev low sat fat diet in detail LDL in 140s Pt wants to avoid more mediation  Diet handout given

## 2020-05-20 ENCOUNTER — Other Ambulatory Visit: Payer: Self-pay | Admitting: Family Medicine

## 2020-05-20 DIAGNOSIS — Z1231 Encounter for screening mammogram for malignant neoplasm of breast: Secondary | ICD-10-CM

## 2020-05-27 ENCOUNTER — Other Ambulatory Visit: Payer: Self-pay

## 2020-05-27 DIAGNOSIS — B353 Tinea pedis: Secondary | ICD-10-CM

## 2020-05-27 MED ORDER — CICLOPIROX 8 % EX SOLN: CUTANEOUS | 9 refills | Status: DC

## 2020-05-27 NOTE — Progress Notes (Signed)
Pt requested new pharmacy  

## 2020-06-04 ENCOUNTER — Encounter: Payer: Self-pay | Admitting: Cardiology

## 2020-06-04 ENCOUNTER — Ambulatory Visit (INDEPENDENT_AMBULATORY_CARE_PROVIDER_SITE_OTHER): Payer: Medicare HMO | Admitting: Cardiology

## 2020-06-04 ENCOUNTER — Other Ambulatory Visit: Payer: Self-pay

## 2020-06-04 VITALS — BP 108/68 | HR 56 | Ht 63.0 in | Wt 182.6 lb

## 2020-06-04 DIAGNOSIS — E7800 Pure hypercholesterolemia, unspecified: Secondary | ICD-10-CM | POA: Diagnosis not present

## 2020-06-04 DIAGNOSIS — I10 Essential (primary) hypertension: Secondary | ICD-10-CM | POA: Diagnosis not present

## 2020-06-04 DIAGNOSIS — I480 Paroxysmal atrial fibrillation: Secondary | ICD-10-CM

## 2020-06-04 MED ORDER — VERAPAMIL HCL ER 240 MG PO TBCR
120.0000 mg | EXTENDED_RELEASE_TABLET | Freq: Every day | ORAL | 3 refills | Status: DC
Start: 2020-06-04 — End: 2020-11-16

## 2020-06-04 NOTE — Progress Notes (Signed)
Cardiology Office Note:    Date:  06/04/2020   ID:  PRAJNA VANDERPOOL, DOB [birth year], MRN [id number]  PCP:  Abner Greenspan, MD  Cardiologist:  No primary care provider on file.  Electrophysiologist:  None   Referring MD: Abner Greenspan, MD   No chief complaint on file.   History of Present Illness:    Monica Guerrero is a 79 y.o. female with a hx of hypertension, paroxysmal A. fib who presents for follow-up.  Patient is being seen due to history of paroxysmal atrial fibrillation.  She takes bisoprolol and verapamil for heart rate control.  Tolerating Eliquis without any bleeding side effects.  Patient feels well, her blood pressure is well controlled at home, denies palpitations, dizziness, presyncope or syncope.   Past Medical History:  Diagnosis Date  . Allergy   . Arthritis    Osteoarthritis  . Basal cell carcinoma 12/06/2016   L ant lat neck anteriro  . Basal cell carcinoma 10/20/2016   L ant lat neck posterior  . Cancer ([place])    skin  . Hypertension   . Osteoporosis   . 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 BASAL CELL CARCINOMA EXCISION  12/2016   neck  . EYE SURGERY     Bilateral cataract Surgery  . SQUAMOUS CELL CARCINOMA EXCISION  12/2016   neck  . TONSILLECTOMY      Current Medications: Current Meds  Medication Sig  . acetaminophen (TYLENOL ARTHRITIS PAIN) 650 MG CR tablet Take 650 mg by mouth every 8 (eight) hours as needed. Alternates with Ibuprofen.   Marland Kitchen apixaban (ELIQUIS) 5 MG TABS tablet Take 1 tablet (5 mg total) by mouth 2 (two) times daily.  . bisoprolol-hydrochlorothiazide (ZIAC) 2.5-6.25 MG tablet Take 1 tablet by mouth daily.  . calcium carbonate (OS-CAL) 600 MG TABS Take 600 mg by mouth daily.   . chlorpheniramine (CHLOR-TRIMETON) 4 MG tablet Take 4 mg by mouth 2 (two) times daily as needed.    . cholecalciferol (VITAMIN D) 1000 UNITS tablet  Take 2,000 Units by mouth daily.  . ciclopirox (PENLAC) 8 % solution Apply topically at bedtime. Apply over nail and surrounding skin. Apply daily over previous coat.  Marland Kitchen GARLIC PO Take 1 capsule by mouth daily.  . hydrochlorothiazide (HYDRODIURIL) 25 MG tablet Take 1 tablet (25 mg total) by mouth daily.  Marland Kitchen Lifitegrast (XIIDRA) 5 % SOLN Place 1 drop into both eyes 2 (two) times a day.  . loratadine (CLARITIN) 10 MG tablet Take 10 mg by mouth as directed.    . Multiple Vitamins-Minerals (PRESERVISION AREDS 2) CAPS Take 1 capsule by mouth 2 (two) times a day.  . phenylephrine (NEO-SYNEPHRINE) 0.125 % nasal drops Place 1 drop into the nose as needed.  Vladimir Faster Glycol-Propyl Glycol (SYSTANE OP) Apply to eye 2 (two) times daily as needed.  . timolol (BETIMOL) 0.5 % ophthalmic solution Place 1 drop into both eyes daily.   . verapamil (CALAN-SR) 240 MG CR tablet Take 0.5 tablets (120 mg total) by mouth at bedtime.  . [DISCONTINUED] verapamil (CALAN-SR) 240 MG CR tablet Take 1 tablet (240 mg total) by mouth at bedtime.     Allergies:   Patient has no known allergies.   Social History   Socioeconomic History  . Marital status: Single    Spouse name: Not on file  . Number of children:  2  . Years of education: Not on file  . Highest education level: Not on file  Occupational History  . Occupation: Bank Teller  Tobacco Use  . Smoking status: Never Smoker  . Smokeless tobacco: Never Used  Vaping Use  . Vaping Use: Never used  Substance and Sexual Activity  . Alcohol use: No    Alcohol/week: 0.0 standard drinks  . Drug use: No  . Sexual activity: Not Currently  Other Topics Concern  . Not on file  Social History Narrative  . Not on file   Social Determinants of Health   Financial Resource Strain: Low Risk   . Difficulty of Paying Living Expenses: Not hard at all  Food Insecurity: No Food Insecurity  . Worried About Charity fundraiser in the Last Year: Never true  . Ran Out of Food  in the Last Year: Never true  Transportation Needs: No Transportation Needs  . Lack of Transportation (Medical): No  . Lack of Transportation (Non-Medical): No  Physical Activity: Inactive  . Days of Exercise per Week: 0 days  . Minutes of Exercise per Session: 0 min  Stress: No Stress Concern Present  . Feeling of Stress : Not at all  Social Connections:   . Frequency of Communication with Friends and Family: Not on file  . Frequency of Social Gatherings with Friends and Family: Not on file  . Attends Religious Services: Not on file  . Active Member of Clubs or Organizations: Not on file  . Attends Archivist Meetings: Not on file  . Marital Status: Not on file     Family History: The patient's family history includes Cancer in her father and paternal grandmother; Dementia in her mother; Heart disease in her father, maternal grandfather, and maternal uncle; Hypertension in her brother; Stroke in her maternal grandmother. There is no history of Breast cancer.  ROS:   Please see the history of present illness.     All other systems reviewed and are negative.  EKGs/Labs/Other Studies Reviewed:    The following studies were reviewed today: 2-week cardiac monitor date 09/12/2019 Patient had a min HR of 41 bpm, max HR of 176 bpm, and avg HR of 61 bpm. Predominant underlying rhythm was Sinus Rhythm. 3191 Supraventricular Tachycardia runs occurred, the run with the fastest interval lasting 1 min 51 secs with a max rate of 176 bpm, the longest lasting 2 mins 52 secs with an avg rate of 149 bpm. Atrial Fibrillation occurred (5% burden), ranging from 57-146 bpm (avg of 87 bpm), the longest lasting 26 mins 10 secs with an avg rate of 91 bpm. Supraventricular Tachycardia and Atrial Fibrillation were detected within +/- 45 seconds of symptomatic patient event(s).  EKG:  EKG is  ordered today.  The ekg ordered today demonstrates sinus bradycardia, heart rate 56  Recent  Labs: 05/07/2020: ALT 14; BUN 16; Creatinine, Ser 0.79; Hemoglobin 13.9; Platelets 216.0; Potassium 3.4; Sodium 142; TSH 3.49  Recent Lipid Panel    Component Value Date/Time   CHOL 219 (H) 05/07/2020 0859   TRIG 134.0 05/07/2020 0859   HDL 50.10 05/07/2020 0859   CHOLHDL 4 05/07/2020 0859   VLDL 26.8 05/07/2020 0859   LDLCALC 142 (H) 05/07/2020 0859   LDLDIRECT 175.7 09/09/2013 0934    Physical Exam:    VS:  BP 108/68   Pulse (!) 56   Ht 5\' 3"  (1.6 m)   Wt 182 lb 9.6 oz (82.8 kg)   SpO2 97%  BMI 32.35 kg/m     Wt Readings from Last 3 Encounters:  06/04/20 182 lb 9.6 oz (82.8 kg)  05/15/20 181 lb 1 oz (82.1 kg)  12/06/19 186 lb (84.4 kg)     GEN:  Well nourished, well developed in no acute distress HEENT: Normal NECK: No JVD; No carotid bruits LYMPHATICS: No lymphadenopathy CARDIAC: RRR, no murmurs, rubs, gallops RESPIRATORY:  Clear to auscultation without rales, wheezing or rhonchi  ABDOMEN: Soft, non-tender, non-distended MUSCULOSKELETAL:  No edema; No deformity  SKIN: Warm and dry NEUROLOGIC:  Alert and oriented x 3 PSYCHIATRIC:  Normal affect   ASSESSMENT:    1. Paroxysmal atrial fibrillation (HCC)   2. Essential hypertension   3. Pure hypercholesterolemia    PLAN:    In order of problems listed above:  1. History of paroxysmal atrial fibrillation.  Currently in sinus bradycardia.  CHA2DS2-VASc score of 4 (age, htn, gender).  Last Echocardiogram showed normal systolic function with EF 60 to 65%, impaired relaxation.  Continue Eliquis 5 mg twice daily.  Heart rate 56.  Decrease verapamil SR to 120 mg daily. 2. History of hypertension, BP low normal,  continue HCTZ 25 daily, decrease verapamil 120 mg daily, continue bisoprolol 3. History of hyperlipidemia, management provided by primary care physician.  Medications recommended but patient trying to avoid more medications.  Continue low-cholesterol diet.  Follow-up with PCP.  Follow-up in 6 months.  Total  encounter time 40 minutes  Greater than 50% was spent in counseling and coordination of care with the patient, medication management   This note was generated in part or whole with voice recognition software. Voice recognition is usually quite accurate but there are transcription errors that can and very often do occur. I apologize for any typographical errors that were not detected and corrected.  Medication Adjustments/Labs and Tests Ordered: Current medicines are reviewed at length with the patient today.  Concerns regarding medicines are outlined above.  Orders Placed This Encounter  Procedures  . EKG 12-Lead   Meds ordered this encounter  Medications  . verapamil (CALAN-SR) 240 MG CR tablet    Sig: Take 0.5 tablets (120 mg total) by mouth at bedtime.    Dispense:  90 tablet    Refill:  3    Patient Instructions  Medication Instructions:  Your physician has recommended you make the following change in your medication:  1- DECREASE Verapamil to 120 mg (0.5 tablet) by mouth once a day.  *If you need a refill on your cardiac medications before your next appointment, please call your pharmacy*  Follow-Up: At [facility], you and your health needs are our priority.  As part of our continuing mission to provide you with exceptional heart care, we have created designated Provider Care Teams.  These Care Teams include your primary Cardiologist (physician) and Advanced Practice Providers (APPs -  Physician Assistants and Nurse Practitioners) who all work together to provide you with the care you need, when you need it.  We recommend signing up for the patient portal called "MyChart".  Sign up information is provided on this After Visit Summary.  MyChart is used to connect with patients for Virtual Visits (Telemedicine).  Patients are able to view lab/test results, encounter notes, upcoming appointments, etc.  Non-urgent messages can be sent to your provider as well.   To learn more about  what you can do with MyChart, go to [email or web address].    Your next appointment:   6 month(s)  The format for your  next appointment:   In Person  Provider:   You may see Dr. Garen Lah or one of the following Advanced Practice Providers on your designated Care Team:    Murray Hodgkins, NP  Christell Faith, PA-C  Marrianne Mood, PA-C  Cadence [place], [place]      Signed, Kate Sable, MD  06/04/2020 10:54 AM    [place]

## 2020-06-04 NOTE — Patient Instructions (Signed)
Medication Instructions:  Your physician has recommended you make the following change in your medication:  1- DECREASE Verapamil to 120 mg (0.5 tablet) by mouth once a day.  *If you need a refill on your cardiac medications before your next appointment, please call your pharmacy*  Follow-Up: At [facility] [place] [facility], you and your health needs are our priority.  As part of our continuing mission to provide you with exceptional heart care, we have created designated Provider Care Teams.  These Care Teams include your primary Cardiologist (physician) and Advanced Practice Providers (APPs -  Physician Assistants and Nurse Practitioners) who all work together to provide you with the care you need, when you need it.  We recommend signing up for the patient portal called "MyChart".  Sign up information is provided on this After Visit Summary.  MyChart is used to connect with patients for Virtual Visits (Telemedicine).  Patients are able to view lab/test results, encounter notes, upcoming appointments, etc.  Non-urgent messages can be sent to your provider as well.   To learn more about what you can do with MyChart, go to [email or web address].    Your next appointment:   6 month(s)  The format for your next appointment:   In Person  Provider:   You may see Dr. Garen Lah or one of the following Advanced Practice Providers on your designated Care Team:    Murray Hodgkins, NP  Christell Faith, PA-C  Marrianne Mood, PA-C  Cadence [place], [place]

## 2020-06-08 ENCOUNTER — Ambulatory Visit
Admission: RE | Admit: 2020-06-08 | Discharge: 2020-06-08 | Disposition: A | Discharge status: Home / Self Care | Payer: Medicare HMO | Source: Ambulatory Visit | Attending: Family Medicine | Admitting: Family Medicine

## 2020-06-08 ENCOUNTER — Other Ambulatory Visit: Payer: Self-pay

## 2020-06-08 DIAGNOSIS — Z1231 Encounter for screening mammogram for malignant neoplasm of breast: Secondary | ICD-10-CM | POA: Diagnosis not present

## 2020-06-15 ENCOUNTER — Other Ambulatory Visit: Payer: Self-pay

## 2020-06-15 MED ORDER — APIXABAN 5 MG PO TABS
5.0000 mg | ORAL_TABLET | Freq: Two times a day (BID) | ORAL | 1 refills | Status: DC
Start: 2020-06-15 — End: 2020-12-14

## 2020-07-21 ENCOUNTER — Telehealth: Payer: Self-pay

## 2020-07-21 NOTE — Telephone Encounter (Signed)
Pt left v/m; pt saw cardiologist and pt was advised to start taking verapamil 240 mg CR taking 1/2 tab or 120 mg po hs. Pt got notice from pharmacy today that a refill for verapamil 240 mg taking 1 tab daily from previous refill by our office was done. Pt request cb to verify [facility] and cardiology is the same. Pt does not want a refill done now.

## 2020-07-21 NOTE — Telephone Encounter (Signed)
Pt is taking 1/2 tablet daily, I advise pt we have not sent in any new Rxs for med saying take one whole tablet so it must be an old Rx, I advise her to keep taking what the cardiologist told her to take which is a 1/2 tablet. (I see they sent in a "No Print" Rx with new instructions)  Will route to cardiologist to confirm with pt since she was confused regarding the dose

## 2020-07-21 NOTE — Telephone Encounter (Signed)
I didn't refill med will route to PCP for review

## 2020-07-21 NOTE — Telephone Encounter (Signed)
Please clarify with pt what she is supposed to take/what she is taking  Let me know if she needs a refill now  Change in med list if needed

## 2020-07-23 NOTE — Telephone Encounter (Signed)
Spoke to patient and clarified that she is taking the correct dose as stated by Dr. Garen Lah in the following note:  Patient should be on verapamil 120 mg daily (0.5 tab of 240mg  as indicated in my last note.)    Patient was grateful for the call back.

## 2020-08-05 DIAGNOSIS — H401131 Primary open-angle glaucoma, bilateral, mild stage: Secondary | ICD-10-CM | POA: Diagnosis not present

## 2020-08-13 DIAGNOSIS — H401131 Primary open-angle glaucoma, bilateral, mild stage: Secondary | ICD-10-CM | POA: Diagnosis not present

## 2020-09-08 ENCOUNTER — Other Ambulatory Visit: Payer: Self-pay | Admitting: Internal Medicine

## 2020-09-08 ENCOUNTER — Ambulatory Visit: Payer: Medicare HMO | Attending: Internal Medicine

## 2020-09-08 DIAGNOSIS — Z23 Encounter for immunization: Secondary | ICD-10-CM

## 2020-09-08 NOTE — Progress Notes (Signed)
   Covid-19 Vaccination Clinic  Name:  Monica Guerrero    MRN: [id number] DOB: 05/09/1941  09/08/2020  [name] was observed post Covid-19 immunization for 15 minutes without incident. She was provided with Vaccine Information Sheet and instruction to access the V-Safe system.   [name] was instructed to call 911 with any severe reactions post vaccine: Marland Kitchen Difficulty breathing  . Swelling of face and throat  . A fast heartbeat  . A bad rash all over body  . Dizziness and weakness   Immunizations Administered    Name Date Dose VIS Date Route   Pfizer COVID-19 Vaccine 09/08/2020 12:08 PM 0.3 mL 06/24/2020 Intramuscular   Manufacturer: [employer], [employer]   Lot: [id number]   NDC: [address]-3

## 2020-11-16 ENCOUNTER — Ambulatory Visit: Payer: Medicare HMO | Admitting: Cardiology

## 2020-11-16 ENCOUNTER — Other Ambulatory Visit: Payer: Self-pay

## 2020-11-16 ENCOUNTER — Encounter: Payer: Self-pay | Admitting: Cardiology

## 2020-11-16 VITALS — BP 120/76 | HR 55 | Ht 63.0 in | Wt 180.5 lb

## 2020-11-16 DIAGNOSIS — I10 Essential (primary) hypertension: Secondary | ICD-10-CM | POA: Diagnosis not present

## 2020-11-16 DIAGNOSIS — I480 Paroxysmal atrial fibrillation: Secondary | ICD-10-CM | POA: Diagnosis not present

## 2020-11-16 MED ORDER — VERAPAMIL HCL ER 120 MG PO TBCR: 120.0000 mg | EXTENDED_RELEASE_TABLET | Freq: Every day | ORAL | 3 refills | Status: DC

## 2020-11-16 NOTE — Progress Notes (Signed)
Cardiology Office Note:    Date:  11/16/2020   ID:  [name], DOB 1941-07-17, MRN [id number]  PCP:  Abner Greenspan, MD  Cardiologist:  No primary care provider on file.  Electrophysiologist:  None   Referring MD: Abner Greenspan, MD   Chief Complaint  Patient presents with  . 6 month follow up     "doing well." Medications reviewed by the patient verbally.     History of Present Illness:    Monica Guerrero is a 80 y.o. female with a hx of hyperlipidemia, hypertension, paroxysmal A. fib who presents for follow-up.    Being seen for hypertension, paroxysmal atrial fibrillation.  BP was low normal after last visit, 23s.  Verapamil was decreased to 120 mg.  Tolerating Eliquis without any bleeding effects.  Tenderness palpitations, chest pain or syncope.  Doing well, has no concerns at this time   Past Medical History:  Diagnosis Date  . Allergy   . Arthritis    Osteoarthritis  . Basal cell carcinoma 12/06/2016   L ant lat neck anteriro  . Basal cell carcinoma 10/20/2016   L ant lat neck posterior  . Cancer ([place])    skin  . Hypertension   . Osteoporosis   . 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 BASAL CELL CARCINOMA EXCISION  12/2016   neck  . EYE SURGERY     Bilateral cataract Surgery  . SQUAMOUS CELL CARCINOMA EXCISION  12/2016   neck  . TONSILLECTOMY      Current Medications: Current Meds  Medication Sig  . acetaminophen (TYLENOL) 650 MG CR tablet Take 650 mg by mouth every 8 (eight) hours as needed. Alternates with Ibuprofen.  Marland Kitchen apixaban (ELIQUIS) 5 MG TABS tablet Take 1 tablet (5 mg total) by mouth 2 (two) times daily.  . calcium carbonate (OS-CAL) 600 MG TABS Take 600 mg by mouth daily.  . chlorpheniramine (CHLOR-TRIMETON) 4 MG tablet Take 4 mg by mouth 2 (two) times daily as needed.  . cholecalciferol (VITAMIN D) 1000 UNITS tablet Take 2,000 Units by  mouth daily.  . ciclopirox (PENLAC) 8 % solution Apply topically at bedtime. Apply over nail and surrounding skin. Apply daily over previous coat.  Marland Kitchen GARLIC PO Take 1 capsule by mouth daily.  . hydrochlorothiazide (HYDRODIURIL) 25 MG tablet Take 1 tablet (25 mg total) by mouth daily.  . Lifitegrast 5 % SOLN Place 1 drop into both eyes 2 (two) times a day.  . loratadine (CLARITIN) 10 MG tablet Take 10 mg by mouth as directed.  . Multiple Vitamins-Minerals (PRESERVISION AREDS 2) CAPS Take 1 capsule by mouth 2 (two) times a day.  . phenylephrine (NEO-SYNEPHRINE) 0.125 % nasal drops Place 1 drop into the nose as needed.  Vladimir Faster Glycol-Propyl Glycol (SYSTANE OP) Apply to eye 2 (two) times daily as needed.  . timolol (BETIMOL) 0.5 % ophthalmic solution Place 1 drop into both eyes daily.  . [DISCONTINUED] bisoprolol-hydrochlorothiazide (ZIAC) 2.5-6.25 MG tablet Take 1 tablet by mouth daily.  . [DISCONTINUED] verapamil (CALAN-SR) 120 MG CR tablet Take 120 mg by mouth at bedtime.     Allergies:   Patient has no known allergies.   Social History   Socioeconomic History  . Marital status: Single    Spouse name: Not on file  . Number of children: 2  . Years of education: Not  on file  . Highest education level: Not on file  Occupational History  . Occupation: Bank Teller  Tobacco Use  . Smoking status: Never Smoker  . Smokeless tobacco: Never Used  Vaping Use  . Vaping Use: Never used  Substance and Sexual Activity  . Alcohol use: No    Alcohol/week: 0.0 standard drinks  . Drug use: No  . Sexual activity: Not Currently  Other Topics Concern  . Not on file  Social History Narrative  . Not on file   Social Determinants of Health   Financial Resource Strain: Low Risk   . Difficulty of Paying Living Expenses: Not hard at all  Food Insecurity: No Food Insecurity  . Worried About Charity fundraiser in the Last Year: Never true  . Ran Out of Food in the Last Year: Never true   Transportation Needs: No Transportation Needs  . Lack of Transportation (Medical): No  . Lack of Transportation (Non-Medical): No  Physical Activity: Inactive  . Days of Exercise per Week: 0 days  . Minutes of Exercise per Session: 0 min  Stress: No Stress Concern Present  . Feeling of Stress : Not at all  Social Connections: Not on file     Family History: The patient's family history includes Cancer in her father and paternal grandmother; Dementia in her mother; Heart disease in her father, maternal grandfather, and maternal uncle; Hypertension in her brother; Stroke in her maternal grandmother. There is no history of Breast cancer.  ROS:   Please see the history of present illness.     All other systems reviewed and are negative.  EKGs/Labs/Other Studies Reviewed:      EKG:  EKG is  ordered today.  The ekg ordered today demonstrates sinus bradycardia, heart rate 55  Recent Labs: 05/07/2020: ALT 14; BUN 16; Creatinine, Ser 0.79; Hemoglobin 13.9; Platelets 216.0; Potassium 3.4; Sodium 142; TSH 3.49  Recent Lipid Panel    Component Value Date/Time   CHOL 219 (H) 05/07/2020 0859   TRIG 134.0 05/07/2020 0859   HDL 50.10 05/07/2020 0859   CHOLHDL 4 05/07/2020 0859   VLDL 26.8 05/07/2020 0859   LDLCALC 142 (H) 05/07/2020 0859   LDLDIRECT 175.7 09/09/2013 0934    Physical Exam:    VS:  BP 120/76 (BP Location: Left Arm, Patient Position: Sitting, Cuff Size: Normal)   Pulse (!) 55   Ht 5\' 3"  (1.6 m)   Wt 180 lb 8 oz (81.9 kg)   SpO2 98%   BMI 31.97 kg/m     Wt Readings from Last 3 Encounters:  11/16/20 180 lb 8 oz (81.9 kg)  06/04/20 182 lb 9.6 oz (82.8 kg)  05/15/20 181 lb 1 oz (82.1 kg)     GEN:  Well nourished, well developed in no acute distress HEENT: Normal NECK: No JVD; No carotid bruits LYMPHATICS: No lymphadenopathy CARDIAC: RRR, no murmurs, rubs, gallops RESPIRATORY:  Clear to auscultation without rales, wheezing or rhonchi  ABDOMEN: Soft, non-tender,  non-distended MUSCULOSKELETAL:  No edema; No deformity  SKIN: Warm and dry NEUROLOGIC:  Alert and oriented x 3 PSYCHIATRIC:  Normal affect   ASSESSMENT:    1. Paroxysmal atrial fibrillation (HCC)   2. Essential hypertension    PLAN:    In order of problems listed above:  1. paroxysmal atrial fibrillation.  Currently in sinus bradycardia.  CHA2DS2-VASc score of 4 (age, htn, gender).  Continue Eliquis 5 mg twice daily.  Heart rate 56.  Stop bisoprolol/ziac 2.5-6.25mg  bid.  Continue verapamil. 2. History of hypertension, BP normal,  continue HCTZ 25 daily, verapamil 120 mg daily, stop ziac.  Follow-up in 12 months.  Total encounter time 35 minutes  Greater than 50% was spent in counseling and coordination of care with the patient, medication management   This note was generated in part or whole with voice recognition software. Voice recognition is usually quite accurate but there are transcription errors that can and very often do occur. I apologize for any typographical errors that were not detected and corrected.  Medication Adjustments/Labs and Tests Ordered: Current medicines are reviewed at length with the patient today.  Concerns regarding medicines are outlined above.  Orders Placed This Encounter  Procedures  . EKG 12-Lead   Meds ordered this encounter  Medications  . verapamil (CALAN-SR) 120 MG CR tablet    Sig: Take 1 tablet (120 mg total) by mouth at bedtime.    Dispense:  90 tablet    Refill:  3    Patient Instructions  Medication Instructions:   Your physician has recommended you make the following change in your medication:   1.  STOP taking your bisoprolol-hydrochlorothiazide [facility]).  *If you need a refill on your cardiac medications before your next appointment, please call your pharmacy*   Lab Work: None ordered If you have labs (blood work) drawn today and your tests are completely normal, you will receive your results only by: Marland Kitchen MyChart Message (if  you have MyChart) OR . A paper copy in the mail If you have any lab test that is abnormal or we need to change your treatment, we will call you to review the results.   Testing/Procedures: None ordered   Follow-Up: At [facility], you and your health needs are our priority.  As part of our continuing mission to provide you with exceptional heart care, we have created designated Provider Care Teams.  These Care Teams include your primary Cardiologist (physician) and Advanced Practice Providers (APPs -  Physician Assistants and Nurse Practitioners) who all work together to provide you with the care you need, when you need it.  We recommend signing up for the patient portal called "MyChart".  Sign up information is provided on this After Visit Summary.  MyChart is used to connect with patients for Virtual Visits (Telemedicine).  Patients are able to view lab/test results, encounter notes, upcoming appointments, etc.  Non-urgent messages can be sent to your provider as well.   To learn more about what you can do with MyChart, go to [email or web address].    Your next appointment:   1 year(s)  The format for your next appointment:   In Person  Provider:   Kate Sable, MD   Other Instructions       Signed, Kate Sable, MD  11/16/2020 11:19 AM    [place]

## 2020-11-16 NOTE — Patient Instructions (Signed)
Medication Instructions:   Your physician has recommended you make the following change in your medication:   1.  STOP taking your bisoprolol-hydrochlorothiazide [facility]).  *If you need a refill on your cardiac medications before your next appointment, please call your pharmacy*   Lab Work: None ordered If you have labs (blood work) drawn today and your tests are completely normal, you will receive your results only by: Marland Kitchen MyChart Message (if you have MyChart) OR . A paper copy in the mail If you have any lab test that is abnormal or we need to change your treatment, we will call you to review the results.   Testing/Procedures: None ordered   Follow-Up: At [facility], you and your health needs are our priority.  As part of our continuing mission to provide you with exceptional heart care, we have created designated Provider Care Teams.  These Care Teams include your primary Cardiologist (physician) and Advanced Practice Providers (APPs -  Physician Assistants and Nurse Practitioners) who all work together to provide you with the care you need, when you need it.  We recommend signing up for the patient portal called "MyChart".  Sign up information is provided on this After Visit Summary.  MyChart is used to connect with patients for Virtual Visits (Telemedicine).  Patients are able to view lab/test results, encounter notes, upcoming appointments, etc.  Non-urgent messages can be sent to your provider as well.   To learn more about what you can do with MyChart, go to [email or web address].    Your next appointment:   1 year(s)  The format for your next appointment:   In Person  Provider:   Kate Sable, MD   Other Instructions

## 2020-11-23 ENCOUNTER — Other Ambulatory Visit: Payer: Self-pay | Admitting: *Deleted

## 2020-11-23 MED ORDER — BISOPROLOL-HYDROCHLOROTHIAZIDE 2.5-6.25 MG PO TABS: ORAL_TABLET | ORAL | Status: DC

## 2020-11-23 NOTE — Progress Notes (Signed)
[facility]

## 2020-12-02 ENCOUNTER — Other Ambulatory Visit: Payer: Self-pay

## 2020-12-02 ENCOUNTER — Encounter: Payer: Self-pay | Admitting: Dermatology

## 2020-12-02 ENCOUNTER — Ambulatory Visit: Payer: Medicare HMO | Admitting: Dermatology

## 2020-12-02 DIAGNOSIS — L820 Inflamed seborrheic keratosis: Secondary | ICD-10-CM

## 2020-12-02 DIAGNOSIS — L821 Other seborrheic keratosis: Secondary | ICD-10-CM

## 2020-12-02 DIAGNOSIS — Z85828 Personal history of other malignant neoplasm of skin: Secondary | ICD-10-CM | POA: Diagnosis not present

## 2020-12-02 DIAGNOSIS — B353 Tinea pedis: Secondary | ICD-10-CM | POA: Diagnosis not present

## 2020-12-02 DIAGNOSIS — L578 Other skin changes due to chronic exposure to nonionizing radiation: Secondary | ICD-10-CM | POA: Diagnosis not present

## 2020-12-02 DIAGNOSIS — L814 Other melanin hyperpigmentation: Secondary | ICD-10-CM | POA: Diagnosis not present

## 2020-12-02 DIAGNOSIS — L570 Actinic keratosis: Secondary | ICD-10-CM

## 2020-12-02 DIAGNOSIS — B351 Tinea unguium: Secondary | ICD-10-CM | POA: Diagnosis not present

## 2020-12-02 DIAGNOSIS — D229 Melanocytic nevi, unspecified: Secondary | ICD-10-CM

## 2020-12-02 DIAGNOSIS — Z1283 Encounter for screening for malignant neoplasm of skin: Secondary | ICD-10-CM | POA: Diagnosis not present

## 2020-12-02 DIAGNOSIS — D1800 Hemangioma unspecified site: Secondary | ICD-10-CM

## 2020-12-02 MED ORDER — CICLOPIROX 8 % EX SOLN: CUTANEOUS | 11 refills | Status: DC

## 2020-12-02 MED ORDER — KETOCONAZOLE 2 % EX CREA: TOPICAL_CREAM | CUTANEOUS | 11 refills | Status: DC

## 2020-12-02 NOTE — Patient Instructions (Signed)

## 2020-12-02 NOTE — Progress Notes (Signed)
Follow-Up Visit   Subjective  Monica Guerrero is a 80 y.o. female who presents for the following: Annual Exam (Total body exam today. Pt has hx of BCC and SCC. No hx of dysplastic nevi. She has been treating tinea pedis with tinea unguium). Patient here for full body skin exam and skin cancer screening.  The following portions of the chart were reviewed this encounter and updated as appropriate:  Tobacco  Allergies  Meds  Problems  Med Hx  Surg Hx  Fam Hx     Review of Systems: No other skin or systemic complaints except as noted in HPI or Assessment and Plan.  Objective  Well appearing patient in no apparent distress; mood and affect are within normal limits.  A full examination was performed including scalp, head, eyes, ears, nose, lips, neck, chest, axillae, abdomen, back, buttocks, bilateral upper extremities, bilateral lower extremities, hands, feet, fingers, toes, fingernails, and toenails. All findings within normal limits unless otherwise noted below.  Objective  right cheek x 4 (4): Erythematous thin papules/macules with gritty scale.   Objective  Left Forearm x 1: Erythematous keratotic or waxy stuck-on papule or plaque.   Objective  toenails: Scaling and maceration web spaces and over distal and lateral soles.  Nail dystrophy  Assessment & Plan  AK (actinic keratosis) (4) right cheek x 4 Prior to procedure, discussed risks of blister formation, small wound, skin dyspigmentation, or rare scar following cryotherapy.   Destruction of lesion - right cheek x 4 Complexity: simple   Destruction method: cryotherapy   Informed consent: discussed and consent obtained   Timeout:  patient name, date of birth, surgical site, and procedure verified Lesion destroyed using liquid nitrogen: Yes   Region frozen until ice ball extended beyond lesion: Yes   Outcome: patient tolerated procedure well with no complications   Post-procedure details: wound care instructions given     Inflamed seborrheic keratosis Left Forearm x 1 Prior to procedure, discussed risks of blister formation, small wound, skin dyspigmentation, or rare scar following cryotherapy.   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  Discussed oral treatment. Pt declines.  Continue Ciclopirox topical solution.  Continue ketoconazole cream. Apply to feet qhs for fungal infection.  If expensive can use otc Lamisil cream.   ketoconazole (NIZORAL) 2 % cream - toenails Reordered Medications ciclopirox (PENLAC) 8 % solution   Lentigines - Scattered tan macules - Due to sun exposure - Benign-appering, observe - Recommend daily broad spectrum sunscreen SPF 30+ to sun-exposed areas, reapply every 2 hours as needed. - Call for any changes  Seborrheic Keratoses - Stuck-on, waxy, tan-brown papules and/or plaques  - Benign-appearing - Discussed benign etiology and prognosis. - Observe - Call for any changes  Melanocytic Nevi - Tan-brown and/or pink-flesh-colored symmetric macules and papules - Benign appearing on exam today - Observation - Call clinic for new or changing moles - Recommend daily use of broad spectrum spf 30+ sunscreen to sun-exposed areas.   Hemangiomas - Red papules - Discussed benign nature - Observe -  Call for any changes  Actinic Damage - Chronic condition, secondary to cumulative UV/sun exposure - diffuse scaly erythematous macules with underlying dyspigmentation - Recommend daily broad  spectrum sunscreen SPF 30+ to sun-exposed areas, reapply every 2 hours as needed.  - Staying in the shade or wearing long sleeves, sun glasses (UVA+UVB protection) and wide brim hats (4-inch brim around the entire circumference of the hat) are also recommended for sun protection.  - Call for new or changing lesions.  Skin cancer screening performed today.  History of Basal Cell Carcinoma of the Skin - No evidence of recurrence today - Recommend regular full body skin exams - Recommend daily broad spectrum sunscreen SPF 30+ to sun-exposed areas, reapply every 2 hours as needed.  - Call if any new or changing lesions are noted between office visits  History of Squamous Cell Carcinoma of the Skin - No evidence of recurrence today - No lymphadenopathy - Recommend regular full body skin exams - Recommend daily broad spectrum sunscreen SPF 30+ to sun-exposed areas, reapply every 2 hours as needed.  - Call if any new or changing lesions are noted between office visits  Return in about 1 year (around 12/02/2021) for TBSE.   IHarriett Sine, CMA, am acting as scribe for Sarina Ser, MD.  Documentation: I have reviewed the above documentation for accuracy and completeness, and I agree with the above.  Sarina Ser, MD

## 2020-12-03 ENCOUNTER — Encounter: Payer: Self-pay | Admitting: Dermatology

## 2020-12-14 ENCOUNTER — Other Ambulatory Visit: Payer: Self-pay | Admitting: *Deleted

## 2020-12-14 MED ORDER — ELIQUIS 5 MG PO TABS: 5.0000 mg | ORAL_TABLET | Freq: Two times a day (BID) | ORAL | 1 refills | Status: DC

## 2020-12-14 NOTE — Telephone Encounter (Signed)
Pt's age 80, wt 81.9 kg, SCr 0.79, CrCl 74.66, last ov w/ BA 11/16/20.

## 2020-12-18 ENCOUNTER — Telehealth: Payer: Self-pay | Admitting: Cardiology

## 2020-12-18 MED ORDER — ELIQUIS 5 MG PO TABS: 5.0000 mg | ORAL_TABLET | Freq: Two times a day (BID) | ORAL | 1 refills | Status: DC

## 2020-12-18 NOTE — Telephone Encounter (Signed)
Patient calling for eliquis to be resent to [facility] mail order.

## 2020-12-18 NOTE — Telephone Encounter (Signed)
Refill request

## 2020-12-18 NOTE — Telephone Encounter (Signed)
Pt last saw Dr Garen Lah on 11/16/20, last labs 05/07/20 Creat 0.79, age 81, weight 81.9kg, based on specified criteria pt is on appropriate dosage of Eliquis 5mg  BID.  Will refill rx.

## 2021-02-12 DIAGNOSIS — Z0100 Encounter for examination of eyes and vision without abnormal findings: Secondary | ICD-10-CM | POA: Diagnosis not present

## 2021-02-12 DIAGNOSIS — H35371 Puckering of macula, right eye: Secondary | ICD-10-CM | POA: Diagnosis not present

## 2021-03-04 ENCOUNTER — Telehealth: Payer: Self-pay

## 2021-03-04 NOTE — Telephone Encounter (Signed)
Noted, will try to pinpoint symptom onset date Will evaluate as scheduled.

## 2021-03-04 NOTE — Telephone Encounter (Signed)
Pt left v/m that she tested + covid with home test on 03/04/21. I spoke with pt and has had head congestion on and off for 6 wks due to allergies.  No other covid symptoms. 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 Pt has afib but no symptoms recently; pt does have hypertension and obesity. Pt scheduled a video appt with Gentry Fitz NP on 03/05/21 to see if needs to start oral covid med. Pt will self quarantine, drink plenty of fluids, rest and take tylenol if spikes fever. UC & ED precautions given and pt voiced understanding. Sending note to Dr Glori Bickers as PCP and Gentry Fitz NP.

## 2021-03-05 ENCOUNTER — Telehealth (INDEPENDENT_AMBULATORY_CARE_PROVIDER_SITE_OTHER): Payer: Medicare HMO | Admitting: Primary Care

## 2021-03-05 DIAGNOSIS — U071 COVID-19: Secondary | ICD-10-CM | POA: Insufficient documentation

## 2021-03-05 MED ORDER — MOLNUPIRAVIR EUA 200MG CAPSULE: 4.0000 | ORAL_CAPSULE | Freq: Two times a day (BID) | ORAL | 0 refills | Status: AC

## 2021-03-05 NOTE — Assessment & Plan Note (Signed)
Sounds like symptoms really began yesterday, was exposed five days ago so this makes sense.   She is wanting to pursue Covid treatment. Molnupiravir is our option given her use of apixiban. Rx sent to pharmacy. Discussed to notify later next week if no improvement. ED precautions provided.

## 2021-03-05 NOTE — Patient Instructions (Signed)
Start the molnupiravir medication for Covid-19 infection as discussed.   Please call us next week if you are not better.   Go to the hospital if you get a lot worse.  It was a pleasure meeting you!

## 2021-03-05 NOTE — Progress Notes (Signed)
Patient ID: Monica Guerrero, female    DOB: [birth year], 80 y.o.   MRN: [id number]  Virtual visit completed through [place], a video enabled telemedicine application. Due to national recommendations of social distancing due to COVID-19, a virtual visit is felt to be most appropriate for this patient at this time. Reviewed limitations, risks, security and privacy concerns of performing a virtual visit and the availability of in person appointments. I also reviewed that there may be a patient responsible charge related to this service. The patient agreed to proceed.   Patient location: home Provider location: [place] [facility], office Persons participating in this virtual visit: patient, provider   If any vitals were documented, they were collected by patient at home unless specified below.    There were no vitals taken for this visit.   CC: Cough Subjective:   HPI: Monica Guerrero is a 80 y.o. female patient of Dr. Glori Bickers with a history of hypertension, paroxysmal atrial fibrillation, seasonal allergies, glaucoma, hypothyroidism presenting on 03/05/2021 for Covid Positive  Symptoms of head congestion and cough for the last 6 weeks for which she believed was secondary to allergies. 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 She was in Sunday school this past Sunday (five days ago). Covid test positive on 03/04/21.  Yesterday she began to experience fatigue, cough became worse yesterday. She's been taking Mucinex and Tylenol. She did not feel this way prior to her exposure five days ago.  She is interested in [place] treatment today. She is managed on apixaban for paroxysmal atrial fibrillation. She's had three Covid vaccines.      Relevant past medical, surgical, family and social history reviewed and updated as indicated. Interim medical history since our last visit reviewed. Allergies and medications reviewed and  updated. Outpatient Medications Prior to Visit  Medication Sig Dispense Refill   acetaminophen (TYLENOL) 650 MG CR tablet Take 650 mg by mouth every 8 (eight) hours as needed. Alternates with Ibuprofen.     apixaban (ELIQUIS) 5 MG TABS tablet Take 1 tablet (5 mg total) by mouth 2 (two) times daily. 180 tablet 1   bisoprolol-hydrochlorothiazide (ZIAC) 2.5-6.25 MG tablet Take 1 tablet (2.5-6.25 mg) by mouth once daily     calcium carbonate (OS-CAL) 600 MG TABS Take 600 mg by mouth daily.     chlorpheniramine (CHLOR-TRIMETON) 4 MG tablet Take 4 mg by mouth 2 (two) times daily as needed.     cholecalciferol (VITAMIN D) 1000 UNITS tablet Take 2,000 Units by mouth daily.     ciclopirox (PENLAC) 8 % solution Apply topically at bedtime. Apply over nail and surrounding skin. Apply daily over previous coat. 6.6 mL 11   COVID-19 mRNA vaccine, Pfizer, 30 MCG/0.3ML injection USE AS DIRECTED .3 mL 0   GARLIC PO Take 1 capsule by mouth daily.     hydrochlorothiazide (HYDRODIURIL) 25 MG tablet Take 1 tablet (25 mg total) by mouth daily. 90 tablet 3   ketoconazole (NIZORAL) 2 % cream Apply to feet qhs for fungal infection. 60 g 11   Lifitegrast 5 % SOLN Place 1 drop into both eyes 2 (two) times a day.     loratadine (CLARITIN) 10 MG tablet Take 10 mg by mouth as directed.     Multiple Vitamins-Minerals (PRESERVISION AREDS 2) CAPS Take 1 capsule by mouth 2 (two) times a day.     phenylephrine (NEO-SYNEPHRINE) 0.125 % nasal drops Place 1 drop into the  nose as needed.     Polyethyl Glycol-Propyl Glycol (SYSTANE OP) Apply to eye 2 (two) times daily as needed.     timolol (BETIMOL) 0.5 % ophthalmic solution Place 1 drop into both eyes daily.     verapamil (CALAN-SR) 120 MG CR tablet Take 1 tablet (120 mg total) by mouth at bedtime. 90 tablet 3   No facility-administered medications prior to visit.     Per HPI unless specifically indicated in ROS section below Review of Systems  Constitutional:  Positive for  fatigue. Negative for fever.  HENT:  Positive for congestion.   Respiratory:  Positive for cough.   Musculoskeletal:  Positive for myalgias.  Allergic/Immunologic: Positive for environmental allergies.  Objective:  There were no vitals taken for this visit.  Wt Readings from Last 3 Encounters:  11/16/20 180 lb 8 oz (81.9 kg)  06/04/20 182 lb 9.6 oz (82.8 kg)  05/15/20 181 lb 1 oz (82.1 kg)       Physical exam: Gen: alert, NAD, not ill appearing Pulm: speaks in complete sentences without increased work of breathing, cough noted a few times during visit Psych: normal mood, normal thought content      Results for orders placed or performed in visit on 05/07/20  VITAMIN D 25 Hydroxy (Vit-D Deficiency, Fractures)  Result Value Ref Range   VITD 37.61 30.00 - 100.00 ng/mL  TSH  Result Value Ref Range   TSH 3.49 0.35 - 4.50 uIU/mL  T4, free  Result Value Ref Range   Free T4 0.93 0.60 - 1.60 ng/dL  Lipid panel  Result Value Ref Range   Cholesterol 219 (H) 0 - 200 mg/dL   Triglycerides 134.0 0.0 - 149.0 mg/dL   HDL 50.10 >39.00 mg/dL   VLDL 26.8 0.0 - 40.0 mg/dL   LDL Cholesterol 142 (H) 0 - 99 mg/dL   Total CHOL/HDL Ratio 4    NonHDL 168.64   Comprehensive metabolic panel  Result Value Ref Range   Sodium 142 135 - 145 mEq/L   Potassium 3.4 (L) 3.5 - 5.1 mEq/L   Chloride 101 96 - 112 mEq/L   CO2 33 (H) 19 - 32 mEq/L   Glucose, Bld 98 70 - 99 mg/dL   BUN 16 6 - 23 mg/dL   Creatinine, Ser 0.79 0.40 - 1.20 mg/dL   Total Bilirubin 0.6 0.2 - 1.2 mg/dL   Alkaline Phosphatase 58 39 - 117 U/L   AST 18 0 - 37 U/L   ALT 14 0 - 35 U/L   Total Protein 6.4 6.0 - 8.3 g/dL   Albumin 4.3 3.5 - 5.2 g/dL   GFR 70.29 >60.00 mL/min   Calcium 9.6 8.4 - 10.5 mg/dL  CBC with Differential/Platelet  Result Value Ref Range   WBC 4.8 4.0 - 10.5 K/uL   RBC 4.64 3.87 - 5.11 Mil/uL   Hemoglobin 13.9 12.0 - 15.0 g/dL   HCT 41.7 36.0 - 46.0 %   MCV 89.8 78.0 - 100.0 fl   MCHC 33.2 30.0 - 36.0  g/dL   RDW 13.3 11.5 - 15.5 %   Platelets 216.0 150.0 - 400.0 K/uL   Neutrophils Relative % 59.5 43.0 - 77.0 %   Lymphocytes Relative 28.9 12.0 - 46.0 %   Monocytes Relative 9.1 3.0 - 12.0 %   Eosinophils Relative 2.2 0.0 - 5.0 %   Basophils Relative 0.3 0.0 - 3.0 %   Neutro Abs 2.8 1.4 - 7.7 K/uL   Lymphs Abs 1.4 0.7 - 4.0  K/uL   Monocytes Absolute 0.4 0.1 - 1.0 K/uL   Eosinophils Absolute 0.1 0.0 - 0.7 K/uL   Basophils Absolute 0.0 0.0 - 0.1 K/uL   Assessment & Plan:   Problem List Items Addressed This Visit       Other   COVID-19 virus infection   Relevant Medications   molnupiravir EUA 200 mg CAPS     Meds ordered this encounter  Medications   molnupiravir EUA 200 mg CAPS    Sig: Take 4 capsules (800 mg total) by mouth 2 (two) times daily for 5 days.    Dispense:  40 capsule    Refill:  0    Order Specific Question:   Supervising Provider    Answer:   BEDSOLE, AMY E [[id number]]    No orders of the defined types were placed in this encounter.   I discussed the assessment and treatment plan with the patient. The patient was provided an opportunity to ask questions and all were answered. The patient agreed with the plan and demonstrated an understanding of the instructions. The patient was advised to call back or seek an in-person evaluation if the symptoms worsen or if the condition fails to improve as anticipated.  Follow up plan:  Start the molnupiravir medication for Covid-19 infection as discussed.   Please call us next week if you are not better.   Go to the hospital if you get a lot worse.  It was a pleasure meeting you!   Pleas Koch, NP

## 2021-03-10 ENCOUNTER — Telehealth: Payer: Self-pay | Admitting: *Deleted

## 2021-03-10 NOTE — Chronic Care Management (AMB) (Signed)
  Chronic Care Management   Outreach Note  03/10/2021 Name: [name] MRN: [id number] DOB: 06-18-1941  [name] is a 80 y.o. year old female who is a primary care patient of Tower, Wynelle Fanny, MD. I reached out to Norva Pavlov by phone today in response to a referral sent by Ms. Antonietta Breach [place]'s PCP Tower, Wynelle Fanny, MD     An unsuccessful telephone outreach was attempted today. The patient was referred to the case management team for assistance with care management and care coordination.   Follow Up Plan: A HIPAA compliant phone message was left for the patient providing contact information and requesting a return call.  If patient returns call to provider office, please advise to call Embedded Care Management Care Guide Nickcole Bralley at [place], [place] [facility]  Direct Dial: [phone]

## 2021-03-11 ENCOUNTER — Telehealth: Payer: Self-pay

## 2021-03-11 NOTE — Telephone Encounter (Signed)
I already took a 12:30 so she will have to keep the appt with Dr Silvio Pate.  I am currently out of the office. So sorry about that

## 2021-03-11 NOTE — Telephone Encounter (Signed)
Pt called at 4:50pm waiting on cb from  [facility] sent on 03/10/21. Please see pt message on 03/10/21. Pt said she had video visit on 03/05/21 and pt finished molnupiravir on 03/10/21. Pt tested positive on 03/04/21.  Pt said now starting with head congestion, can't breathe thru nose that is worse later in the day, chills, prod cough with yellow phlegm. And BP today was 138/85 P 62 after taking morning meds and at 4PM 140/88 P 69. No CP or SOB or H/A or dizziness and not other covid symtpoms.Dr Glori Bickers was out of office and Dr Silvio Pate said to send note to Dr Glori Bickers to see what she would recommend; the only appt available on 03/12/21 is Dr Silvio Pate at 11:15 which Dr Silvio Pate said I could block in case Dr Glori Bickers wants pt to have another video visit; if 11:15 visit is not needed unblock that visit early enough for someone else to use.  Pt is in no distress. UC & ED precautions given and pt voiced understanding. [place] CMA is aware and will ck in AM. Sending note to Dr Glori Bickers and Dr Silvio Pate. Pt will wait on cb on 03/12/21 in AM.

## 2021-03-12 ENCOUNTER — Other Ambulatory Visit: Payer: Self-pay

## 2021-03-12 ENCOUNTER — Encounter: Payer: Self-pay | Admitting: Internal Medicine

## 2021-03-12 ENCOUNTER — Ambulatory Visit (INDEPENDENT_AMBULATORY_CARE_PROVIDER_SITE_OTHER): Payer: Medicare HMO | Admitting: Internal Medicine

## 2021-03-12 DIAGNOSIS — U071 COVID-19: Secondary | ICD-10-CM

## 2021-03-12 NOTE — Telephone Encounter (Signed)
Spoke to pt. Made appt at 11:15 this morning with Dr Silvio Pate.

## 2021-03-12 NOTE — Progress Notes (Signed)
Subjective:    Patient ID: Monica Guerrero, female    DOB: Oct 14, 1940, 80 y.o.   MRN: [id number]  HPI Video virtual visit due to ongoing respiratory symptoms after COVID infection Identification done Reviewed limitations and billing and she gave consent Participants--patient in her home and I am in my office  Recent COVID infection and Rx with molnupirvir Has had ongoing cough and congestion--perhaps some worse Mostly head congestion--gets stopped up and trouble breathing through her nose Feels like "head is going to pop" when she blows nose Low grade fever No SOB Not much post nasal drip----some cloudy sputum  Current Outpatient Medications on File Prior to Visit  Medication Sig Dispense Refill   acetaminophen (TYLENOL) 650 MG CR tablet Take 650 mg by mouth every 8 (eight) hours as needed. Alternates with Ibuprofen.     apixaban (ELIQUIS) 5 MG TABS tablet Take 1 tablet (5 mg total) by mouth 2 (two) times daily. 180 tablet 1   bisoprolol-hydrochlorothiazide (ZIAC) 2.5-6.25 MG tablet Take 1 tablet (2.5-6.25 mg) by mouth once daily     calcium carbonate (OS-CAL) 600 MG TABS Take 600 mg by mouth daily.     cholecalciferol (VITAMIN D) 1000 UNITS tablet Take 2,000 Units by mouth daily.     ciclopirox (PENLAC) 8 % solution Apply topically at bedtime. Apply over nail and surrounding skin. Apply daily over previous coat. 6.6 mL 11   GARLIC PO Take 1 capsule by mouth daily.     hydrochlorothiazide (HYDRODIURIL) 25 MG tablet Take 1 tablet (25 mg total) by mouth daily. 90 tablet 3   ketoconazole (NIZORAL) 2 % cream Apply to feet qhs for fungal infection. 60 g 11   Lifitegrast 5 % SOLN Place 1 drop into both eyes 2 (two) times a day.     loratadine (CLARITIN) 10 MG tablet Take 10 mg by mouth as directed.     Multiple Vitamins-Minerals (PRESERVISION AREDS 2) CAPS Take 1 capsule by mouth 2 (two) times a day.     phenylephrine (NEO-SYNEPHRINE) 0.125 % nasal drops Place 1 drop into the nose as  needed.     Polyethyl Glycol-Propyl Glycol (SYSTANE OP) Apply to eye 2 (two) times daily as needed.     timolol (BETIMOL) 0.5 % ophthalmic solution Place 1 drop into both eyes daily.     verapamil (CALAN-SR) 120 MG CR tablet Take 1 tablet (120 mg total) by mouth at bedtime. 90 tablet 3   chlorpheniramine (CHLOR-TRIMETON) 4 MG tablet Take 4 mg by mouth 2 (two) times daily as needed. (Patient not taking: Reported on 03/12/2021)     COVID-19 mRNA vaccine, Pfizer, 30 MCG/0.3ML injection USE AS DIRECTED .3 mL 0   No current facility-administered medications on file prior to visit.    No Known Allergies  Past Medical History:  Diagnosis Date   Allergy    Arthritis    Osteoarthritis   Basal cell carcinoma 12/06/2016   L ant lat neck anteriro   Basal cell carcinoma 10/20/2016   L ant lat neck posterior   Cancer ([place])    skin   Hypertension    Osteoporosis    Squamous cell carcinoma of skin 11/23/2017   L neck lat near angle of mandible (SCCIS)    Past Surgical History:  Procedure Laterality Date   BASAL CELL CARCINOMA EXCISION  12/2016   neck   EYE SURGERY     Bilateral cataract Surgery   SQUAMOUS CELL CARCINOMA EXCISION  12/2016   neck  TONSILLECTOMY      Family History  Problem Relation Age of Onset   Cancer Father        Kidney   Heart disease Father    Cancer Paternal Grandmother    Dementia Mother    Hypertension Brother    Heart disease Maternal Uncle    Stroke Maternal Grandmother    Heart disease Maternal Grandfather    Breast cancer Neg Hx     Social History   Socioeconomic History   Marital status: Single    Spouse name: Not on file   Number of children: 2   Years of education: Not on file   Highest education level: Not on file  Occupational History   Occupation: [employer]  Tobacco Use   Smoking status: Never   Smokeless tobacco: Never  Vaping Use   Vaping Use: Never used  Substance and Sexual Activity   Alcohol use: No    Alcohol/week: 0.0  standard drinks   Drug use: No   Sexual activity: Not Currently  Other Topics Concern   Not on file  Social History Narrative   Not on file   Social Determinants of Health   Financial Resource Strain: Low Risk    Difficulty of Paying Living Expenses: Not hard at all  Food Insecurity: No Food Insecurity   Worried About Charity fundraiser in the Last Year: Never true   [place] in the Last Year: Never true  Transportation Needs: No Transportation Needs   Lack of Transportation (Medical): No   Lack of Transportation (Non-Medical): No  Physical Activity: Inactive   Days of Exercise per Week: 0 days   Minutes of Exercise per Session: 0 min  Stress: No Stress Concern Present   Feeling of Stress : Not at all  Social Connections: Not on file  Intimate Partner Violence: Not At Risk   Fear of Current or Ex-Partner: No   Emotionally Abused: No   Physically Abused: No   Sexually Abused: No   Review of Systems No loss of smell or taste No N/V now Appetite is okay    Objective:   Physical Exam Constitutional:      General: She is not in acute distress.    Appearance: Normal appearance.  Pulmonary:     Effort: Pulmonary effort is normal. No respiratory distress.  Neurological:     Mental Status: She is alert.           Assessment & Plan:

## 2021-03-12 NOTE — Telephone Encounter (Signed)
Patient evaluated by Dr. Silvio Pate today.

## 2021-03-12 NOTE — Assessment & Plan Note (Signed)
Has persistent symptoms that still seem COVID related Nothing to suggest secondary bacterial pneumonia or sinusitis Discussed continuing mucinex (she thinks that helps loosen the mucus) and tylenol If she worsens next week, I would try an empiric antibiotic (z-pak vs augmentin)

## 2021-03-16 ENCOUNTER — Telehealth: Payer: Self-pay

## 2021-03-16 NOTE — Telephone Encounter (Signed)
Patient calls in to report she had virtual with Dr. Silvio Pate last week for positive covid.  Only mild symptoms last week and started Mucinex DM.  Over the weekend through today she has developed the following:  Increased cough prod yellow sputum Fever increasing at 100.4 Increased chest congestion  States BP is elevating some with increased cough.  Denies any SOB or Chest pain and overall doesn't feel too terrible.  But worst symptoms are cough that is persistent and results in her throwing up.      She does have some OTC delsym.   Spoke with Dr. Darnell Level as it is after hours when we received the voice mail she left on the non urgent nurse line.  In reviewing Dr. Alla German last note there is consideration for abx tx if symptoms worsen; however since sxs are not severe and patient is stable, Dr. Darnell Level would like to defer to Dr. Silvio Pate to give tx advice.  Patient will take delsym tonight to help with cough until Dr. Silvio Pate can advise first thing  in am.   Dr. Silvio Pate please advise tx plan for patient's progressing symptoms.   She uses [facility] at the corner of [address].

## 2021-03-17 MED ORDER — AZITHROMYCIN 250 MG PO TABS: ORAL_TABLET | ORAL | 0 refills | Status: AC

## 2021-03-17 NOTE — Telephone Encounter (Signed)
Spoke to pt. I have sent in the z-pak.

## 2021-03-17 NOTE — Telephone Encounter (Signed)
Please check on her this morning. If she feels sick, I would send Rx for z-pak----or suggest in person urgent care visit. If she just needs something to calm the cough, I will send narcotic cough syrup

## 2021-03-17 NOTE — Telephone Encounter (Signed)
Spoke to pt. She said she feels better in the mornings when she wakes up. Just feels tired. States she starts feeling bad in the afternoon. That is when the fever and her BP go up. Coughs more when talking or eating. Says she is taking Delsym for the cough so does not think she wants a rx cough syrup.

## 2021-03-22 NOTE — Chronic Care Management (AMB) (Signed)
  Chronic Care Management   Outreach Note  03/22/2021 Name: [name] MRN: [id number] DOB: 01/08/1941  Monica Guerrero is a 80 y.o. year old female who is a primary care patient of Tower, Wynelle Fanny, MD. I reached out to Norva Pavlov by phone today in response to a referral sent by Ms. Antonietta Breach Paiz's PCP Tower, Wynelle Fanny, MD     A second unsuccessful telephone outreach was attempted today. The patient was referred to the case management team for assistance with care management and care coordination.   Follow Up Plan: A HIPAA compliant phone message was left for the patient providing contact information and requesting a return call.  If patient returns call to provider office, please advise to call Embedded Care Management Care Guide Nahlia Hellmann at [place] [place] [facility]  Direct Dial: [phone]

## 2021-03-31 NOTE — Chronic Care Management (AMB) (Signed)
  Chronic Care Management   Note  03/31/2021 Name: MIRIYA CLOER MRN: [id number] DOB: May 08, 1941  KAITHLYN TEAGLE is a 80 y.o. year old female who is a primary care patient of Tower, Wynelle Fanny, MD. I reached out to Norva Pavlov by phone today in response to a referral sent by Ms. Antonietta Breach Uliano's PCP Tower, Wynelle Fanny, MD     Ms. Ballinas was given information about Chronic Care Management services today including:  CCM service includes personalized support from designated clinical staff supervised by her physician, including individualized plan of care and coordination with other care providers 24/7 contact phone numbers for assistance for urgent and routine care needs. Service will only be billed when office clinical staff spend 20 minutes or more in a month to coordinate care. Only one practitioner may furnish and bill the service in a calendar month. The patient may stop CCM services at any time (effective at the end of the month) by phone call to the office staff. The patient will be responsible for cost sharing (co-pay) of up to 20% of the service fee (after annual deductible is met).  Patient agreed to services and verbal consent obtained.   Follow up plan: Telephone appointment with care management team member scheduled for: 04/20/2021  Julian Hy, [place] [facility] [facility]  Direct Dial: [phone]

## 2021-04-20 ENCOUNTER — Ambulatory Visit: Payer: Medicare HMO

## 2021-04-20 NOTE — Telephone Encounter (Signed)
This encounter was created in error - please disregard.

## 2021-04-20 NOTE — Chronic Care Management (AMB) (Signed)
  Care Management   Follow Up Note   04/20/2021 Name: Monica Guerrero MRN: [id number] DOB: 06/07/1941   Referred by: Tower, Wynelle Fanny, MD Reason for referral : No chief complaint on file.   Successful contact was made with the patient to discuss care management and care coordination services. Patient declines engagement at this time.   Follow Up Plan: The patient has been provided with contact information for the care management team and has been advised to call with any health related questions or concerns.   Quinn Plowman RN,BSN,CCM RN Case Manager [facility]  [phone]

## 2021-04-20 NOTE — Patient Instructions (Signed)
Visit Information  Thank you for allowing me to share the care management and care coordination services that are available to you as part of your health plan and services through your primary care provider and medical home. Please reach out to me at [phone]  if the care management/care coordination team may be of assistance to you in the future.   Pati Thinnes RN,BSN,CCM RN Case Manager [place] [place]  [phone]  

## 2021-05-20 ENCOUNTER — Other Ambulatory Visit: Payer: Self-pay | Admitting: *Deleted

## 2021-05-20 MED ORDER — HYDROCHLOROTHIAZIDE 25 MG PO TABS: 25.0000 mg | ORAL_TABLET | Freq: Every day | ORAL | 1 refills | Status: DC

## 2021-05-20 NOTE — Telephone Encounter (Signed)
  Encourage patient to contact the pharmacy for refills or they can request refills through MYCHART  LAST APPOINTMENT DATE:  Please schedule appointment if longer than 1 year  NEXT APPOINTMENT DATE:  MEDICATION:  Is the patient out of medication?   PHARMACY:  Let patient know to contact pharmacy at the end of the day to make sure medication is ready.  Please notify patient to allow 48-72 hours to process  CLINICAL FILLS OUT ALL BELOW:   LAST REFILL:  QTY:  REFILL DATE:    OTHER COMMENTS:    Okay for refill?  Please advise     

## 2021-05-26 ENCOUNTER — Telehealth: Payer: Self-pay | Admitting: Family Medicine

## 2021-05-26 DIAGNOSIS — E7800 Pure hypercholesterolemia, unspecified: Secondary | ICD-10-CM

## 2021-05-26 DIAGNOSIS — E038 Other specified hypothyroidism: Secondary | ICD-10-CM

## 2021-05-26 DIAGNOSIS — E559 Vitamin D deficiency, unspecified: Secondary | ICD-10-CM

## 2021-05-26 DIAGNOSIS — M810 Age-related osteoporosis without current pathological fracture: Secondary | ICD-10-CM

## 2021-05-26 DIAGNOSIS — I10 Essential (primary) hypertension: Secondary | ICD-10-CM

## 2021-05-26 NOTE — Telephone Encounter (Signed)
-----   Message from Ellamae Sia sent at 05/11/2021 11:37 AM EDT ----- Regarding: Lab orders for Thursday, 9.22.22 Patient is scheduled for CPX labs, please order future labs, Thanks , Karna Christmas

## 2021-05-27 ENCOUNTER — Other Ambulatory Visit: Payer: Medicare HMO

## 2021-06-03 ENCOUNTER — Encounter: Payer: Medicare HMO | Admitting: Family Medicine

## 2021-06-10 ENCOUNTER — Other Ambulatory Visit: Payer: Self-pay | Admitting: Family Medicine

## 2021-06-10 NOTE — Telephone Encounter (Signed)
Will route to cardiology since they commented on Rx last

## 2021-06-10 NOTE — Telephone Encounter (Signed)
Medication (bisoprolol- hctz combo) was stopped at last clinic visit

## 2021-06-15 ENCOUNTER — Telehealth: Payer: Self-pay

## 2021-06-15 DIAGNOSIS — I480 Paroxysmal atrial fibrillation: Secondary | ICD-10-CM

## 2021-06-15 MED ORDER — APIXABAN 5 MG PO TABS: 5.0000 mg | ORAL_TABLET | Freq: Two times a day (BID) | ORAL | 0 refills | Status: DC

## 2021-06-15 NOTE — Telephone Encounter (Signed)
Prescription refill request for Eliquis received. Will send in one month supply with indication labs needed Indication:afib Last office visit:agbor etang 11/16/20 Scr:0.790 mg/ 05/07/2020 overdue labs  Age: 88f Weight:81.9kg

## 2021-06-23 NOTE — Telephone Encounter (Signed)
Patient states needs labs ordered for appt refill

## 2021-06-30 NOTE — Telephone Encounter (Signed)
Patient calling to check on status of lab orders Patient would also like to know if she should still be on this medication - states she hasn't had a problem with afib in a while Also would like to discuss assistance Please call to discuss

## 2021-07-06 NOTE — Telephone Encounter (Signed)
Called patient to inform her of the following recommendation from Dr. Garen Lah. Left a VM requesting a call back.  recommend patient stays on Eliquis for A. Fib due to risk for stroke.

## 2021-07-07 NOTE — Addendum Note (Signed)
Addended by: Kavin Leech on: 07/07/2021 11:50 AM   Modules accepted: Orders

## 2021-07-07 NOTE — Telephone Encounter (Signed)
Called patient back and scheduled her for a CBC and BMP in office on 07/12/21 so she can get her refills for the Eliquis. Also printed PAF forms for Eliquis which patient will pick up at her lab appointment to fill out and return to me. Patient was grateful for the call back.

## 2021-07-12 ENCOUNTER — Other Ambulatory Visit: Payer: Self-pay

## 2021-07-12 ENCOUNTER — Other Ambulatory Visit (INDEPENDENT_AMBULATORY_CARE_PROVIDER_SITE_OTHER): Payer: Medicare HMO

## 2021-07-12 DIAGNOSIS — I480 Paroxysmal atrial fibrillation: Secondary | ICD-10-CM

## 2021-07-13 LAB — BASIC METABOLIC PANEL
BUN/Creatinine Ratio: 21 (ref 12–28)
BUN: 16 mg/dL (ref 8–27)
CO2: 28 mmol/L (ref 20–29)
Calcium: 9.7 mg/dL (ref 8.7–10.3)
Chloride: 102 mmol/L (ref 96–106)
Creatinine, Ser: 0.75 mg/dL (ref 0.57–1.00)
Glucose: 88 mg/dL (ref 70–99)
Potassium: 3.7 mmol/L (ref 3.5–5.2)
Sodium: 145 mmol/L — ABNORMAL HIGH (ref 134–144)
eGFR: 81 mL/min/{1.73_m2} (ref >59)

## 2021-07-13 LAB — CBC
Hematocrit: 41.1 % (ref 34.0–46.6)
Hemoglobin: 13.5 g/dL (ref 11.1–15.9)
MCH: 29.6 pg (ref 26.6–33.0)
MCHC: 32.8 g/dL (ref 31.5–35.7)
MCV: 90 fL (ref 79–97)
Platelets: 217 10*3/uL (ref 150–450)
RBC: 4.56 x10E6/uL (ref 3.77–5.28)
RDW: 12.5 % (ref 11.7–15.4)
WBC: 4.3 10*3/uL (ref 3.4–10.8)

## 2021-07-13 NOTE — Telephone Encounter (Signed)
Patient dropped off Patient Assistance forms to be completed Placed in nurse box

## 2021-07-16 ENCOUNTER — Other Ambulatory Visit: Payer: Self-pay | Admitting: *Deleted

## 2021-07-16 MED ORDER — APIXABAN 5 MG PO TABS: 5.0000 mg | ORAL_TABLET | Freq: Two times a day (BID) | ORAL | 1 refills | Status: DC

## 2021-07-16 NOTE — Telephone Encounter (Signed)
Eliquis 5mg  paper refill request received. Patient is 80 years old, weight-81.9kg, Crea-0.75 on 07/12/2021, Diagnosis-Afib, and last seen by Dr. Garen Lah on 11/16/2020. Dose is appropriate based on dosing criteria. Will send in refill to requested pharmacy.

## 2021-07-20 NOTE — Telephone Encounter (Signed)
Started duplicate tele encounter. See tele encounter created on 07/20/21.

## 2021-07-23 NOTE — Telephone Encounter (Signed)
Pt approved for Patient Assistance 07/22/2021-09/04/2021.

## 2021-07-31 ENCOUNTER — Other Ambulatory Visit: Payer: Self-pay | Admitting: Family Medicine

## 2021-08-03 ENCOUNTER — Telehealth: Payer: Self-pay | Admitting: Cardiology

## 2021-08-03 NOTE — Telephone Encounter (Signed)
[facility] to inquire about patients PAF application. PAF was approved through the end of the year, and will have to reapply for PAF for 2023. Their mail pharmacy was trying to contact patient to verify address to mail out medication. I called the patient and gave her the phone number for them [phone]. Patient was grateful for all the help.

## 2021-08-03 NOTE — Telephone Encounter (Signed)
Patient has questions in regards to patient assistance Please call to discuss

## 2021-08-19 DIAGNOSIS — Z0100 Encounter for examination of eyes and vision without abnormal findings: Secondary | ICD-10-CM | POA: Diagnosis not present

## 2021-08-19 DIAGNOSIS — H401131 Primary open-angle glaucoma, bilateral, mild stage: Secondary | ICD-10-CM | POA: Diagnosis not present

## 2021-09-09 ENCOUNTER — Other Ambulatory Visit: Payer: Medicare HMO

## 2021-09-15 ENCOUNTER — Encounter: Payer: Medicare HMO | Admitting: Family Medicine

## 2021-10-18 ENCOUNTER — Other Ambulatory Visit: Payer: Self-pay

## 2021-10-18 ENCOUNTER — Other Ambulatory Visit (INDEPENDENT_AMBULATORY_CARE_PROVIDER_SITE_OTHER): Payer: Medicare HMO

## 2021-10-18 DIAGNOSIS — E038 Other specified hypothyroidism: Secondary | ICD-10-CM | POA: Diagnosis not present

## 2021-10-18 DIAGNOSIS — I10 Essential (primary) hypertension: Secondary | ICD-10-CM

## 2021-10-18 DIAGNOSIS — E7800 Pure hypercholesterolemia, unspecified: Secondary | ICD-10-CM

## 2021-10-18 DIAGNOSIS — E559 Vitamin D deficiency, unspecified: Secondary | ICD-10-CM | POA: Diagnosis not present

## 2021-10-18 LAB — CBC WITH DIFFERENTIAL/PLATELET
Basophils Absolute: 0 10*3/uL (ref 0.0–0.1)
Basophils Relative: 0.4 % (ref 0.0–3.0)
Eosinophils Absolute: 0.1 10*3/uL (ref 0.0–0.7)
Eosinophils Relative: 2.4 % (ref 0.0–5.0)
HCT: 42.5 % (ref 36.0–46.0)
Hemoglobin: 13.9 g/dL (ref 12.0–15.0)
Lymphocytes Relative: 28.8 % (ref 12.0–46.0)
Lymphs Abs: 1.3 10*3/uL (ref 0.7–4.0)
MCHC: 32.8 g/dL (ref 30.0–36.0)
MCV: 90.2 fl (ref 78.0–100.0)
Monocytes Absolute: 0.4 10*3/uL (ref 0.1–1.0)
Monocytes Relative: 8.9 % (ref 3.0–12.0)
Neutro Abs: 2.8 10*3/uL (ref 1.4–7.7)
Neutrophils Relative %: 59.5 % (ref 43.0–77.0)
Platelets: 224 10*3/uL (ref 150.0–400.0)
RBC: 4.72 Mil/uL (ref 3.87–5.11)
RDW: 14.2 % (ref 11.5–15.5)
WBC: 4.7 10*3/uL (ref 4.0–10.5)

## 2021-10-18 LAB — VITAMIN D 25 HYDROXY (VIT D DEFICIENCY, FRACTURES): VITD: 39.2 ng/mL (ref 30.00–100.00)

## 2021-10-18 LAB — TSH: TSH: 3.63 u[IU]/mL (ref 0.35–5.50)

## 2021-10-18 LAB — T4, FREE: Free T4: 0.91 ng/dL (ref 0.60–1.60)

## 2021-10-19 LAB — LIPID PANEL
Cholesterol: 260 mg/dL — ABNORMAL HIGH (ref 0–200)
HDL: 60 mg/dL (ref >39.00)
LDL Cholesterol: 172 mg/dL — ABNORMAL HIGH (ref 0–99)
NonHDL: 200.13
Total CHOL/HDL Ratio: 4
Triglycerides: 141 mg/dL (ref 0.0–149.0)
VLDL: 28.2 mg/dL (ref 0.0–40.0)

## 2021-10-19 LAB — COMPREHENSIVE METABOLIC PANEL
ALT: 13 U/L (ref 0–35)
AST: 18 U/L (ref 0–37)
Albumin: 4.6 g/dL (ref 3.5–5.2)
Alkaline Phosphatase: 57 U/L (ref 39–117)
BUN: 22 mg/dL (ref 6–23)
CO2: 35 mEq/L — ABNORMAL HIGH (ref 19–32)
Calcium: 10 mg/dL (ref 8.4–10.5)
Chloride: 101 mEq/L (ref 96–112)
Creatinine, Ser: 0.79 mg/dL (ref 0.40–1.20)
GFR: 70.87 mL/min (ref >60.00)
Glucose, Bld: 95 mg/dL (ref 70–99)
Potassium: 3.7 mEq/L (ref 3.5–5.1)
Sodium: 141 mEq/L (ref 135–145)
Total Bilirubin: 0.6 mg/dL (ref 0.2–1.2)
Total Protein: 6.8 g/dL (ref 6.0–8.3)

## 2021-10-26 ENCOUNTER — Ambulatory Visit (INDEPENDENT_AMBULATORY_CARE_PROVIDER_SITE_OTHER): Payer: Medicare HMO | Admitting: Family Medicine

## 2021-10-26 ENCOUNTER — Encounter: Payer: Self-pay | Admitting: Family Medicine

## 2021-10-26 ENCOUNTER — Other Ambulatory Visit: Payer: Self-pay

## 2021-10-26 VITALS — BP 132/76 | HR 56 | Temp 98.0°F | Ht 62.75 in | Wt 176.0 lb

## 2021-10-26 DIAGNOSIS — Z0000 Encounter for general adult medical examination without abnormal findings: Secondary | ICD-10-CM | POA: Diagnosis not present

## 2021-10-26 DIAGNOSIS — Z1211 Encounter for screening for malignant neoplasm of colon: Secondary | ICD-10-CM | POA: Diagnosis not present

## 2021-10-26 DIAGNOSIS — E038 Other specified hypothyroidism: Secondary | ICD-10-CM | POA: Diagnosis not present

## 2021-10-26 DIAGNOSIS — E669 Obesity, unspecified: Secondary | ICD-10-CM

## 2021-10-26 DIAGNOSIS — E2839 Other primary ovarian failure: Secondary | ICD-10-CM

## 2021-10-26 DIAGNOSIS — I10 Essential (primary) hypertension: Secondary | ICD-10-CM

## 2021-10-26 DIAGNOSIS — I480 Paroxysmal atrial fibrillation: Secondary | ICD-10-CM | POA: Diagnosis not present

## 2021-10-26 DIAGNOSIS — E559 Vitamin D deficiency, unspecified: Secondary | ICD-10-CM

## 2021-10-26 DIAGNOSIS — Z1231 Encounter for screening mammogram for malignant neoplasm of breast: Secondary | ICD-10-CM

## 2021-10-26 DIAGNOSIS — E7800 Pure hypercholesterolemia, unspecified: Secondary | ICD-10-CM | POA: Diagnosis not present

## 2021-10-26 DIAGNOSIS — M810 Age-related osteoporosis without current pathological fracture: Secondary | ICD-10-CM | POA: Diagnosis not present

## 2021-10-26 MED ORDER — HYDROCHLOROTHIAZIDE 25 MG PO TABS: 25.0000 mg | ORAL_TABLET | Freq: Every day | ORAL | 3 refills | Status: DC

## 2021-10-26 NOTE — Assessment & Plan Note (Signed)
Reviewed health habits including diet and exercise and skin cancer prevention Reviewed appropriate screening tests for age  Also reviewed health mt list, fam hx and immunization status , as well as social and family history   See HPI Labs reviewed cologuard ordered Mammogram ordered  dexa ordered, no falls or fx Declines shingrix vaccine  Advance directive is up to date No cognitive concerns Hearing screen reviewed/pt declines addn eval or treatment utd eye/vision care No help needed with ADLs Good functionality

## 2021-10-26 NOTE — Assessment & Plan Note (Signed)
Under care of cardiology Stable rate Continue eliquis and ziac

## 2021-10-26 NOTE — Assessment & Plan Note (Signed)
Nl tsh this visit  Lab Results  Component Value Date   TSH 3.63 10/18/2021   No clinical changes

## 2021-10-26 NOTE — Assessment & Plan Note (Signed)
Vitamin D level is therapeutic with current supplementation Disc importance of this to bone and overall health Level 39.2 Urged to continue current supplement

## 2021-10-26 NOTE — Progress Notes (Signed)
Subjective:    Patient ID: Monica Guerrero, female    DOB: 12/06/1940, 81 y.o.   MRN: [id number]  This visit occurred during the SARS-CoV-2 public health emergency.  Safety protocols were in place, including screening questions prior to the visit, additional usage of staff PPE, and extensive cleaning of exam room while observing appropriate contact time as indicated for disinfecting solutions.   HPI Pt presents for amw and health mt visit  I have personally reviewed the Medicare Annual Wellness questionnaire and have noted 1. The patient's medical and social history 2. Their use of alcohol, tobacco or illicit drugs 3. Their current medications and supplements 4. The patient's functional ability including ADL's, fall risks, home safety risks and hearing or visual             impairment. 5. Diet and physical activities 6. Evidence for depression or mood disorders  The patients weight, height, BMI have been recorded in the chart and visual acuity is per eye clinic.  I have made referrals, counseling and provided education to the patient based review of the above and I have provided the pt with a written personalized care plan for preventive services. Reviewed and updated provider list, see scanned forms.  See scanned forms.  Routine anticipatory guidance given to patient.  See health maintenance. Colon cancer screening  colonoscopy 2011, cologuard neg 2017  Wants to do cologuard again  Breast cancer screening  mammogram 06/2020, will schedule Self breast exam: no lumps or changes  Flu vaccine-fall 2022 Tetanus vaccine 2013 Pneumovax utd Zoster vaccine: declines  Covid vaccine 09/2020 Dexa 02/2018 OP  Falls-none Fractures-none Supplements ca and D Exercise : some walking and lifting grand child  D level is 39.2   Advance directive up to date  Cognitive function addressed- see scanned forms- and if abnormal then additional documentation follows.   No concerns at all  Handles her  own affairs  Balances check book and does her taxes   PMH and SH reviewed  Meds, vitals, and allergies reviewed.   ROS: See HPI.  Otherwise negative.    Weight : Wt Readings from Last 3 Encounters:  10/26/21 176 lb (79.8 kg)  11/16/20 180 lb 8 oz (81.9 kg)  06/04/20 182 lb 9.6 oz (82.8 kg)   31.43 kg/m  Busy Eating a little better also    Doing well  Church activity-visiting shut ins  Great grandchild   Feels fine   Hearing/vision: Hearing Screening   500Hz  1000Hz  2000Hz  4000Hz   Right ear 40 0 0 0  Left ear 40 0 0 0  Vision Screening - Comments:: Eye exam in Dec 2022 at [facility]. Struggling with vision   Declines further eval or hearing aides   PHQ: Depression screen [facility] 2/9 10/26/2021 05/06/2020 02/04/2019 12/28/2017 12/22/2016  Decreased Interest 0 0 0 0 0  Down, Depressed, Hopeless 0 0 0 0 0  PHQ - 2 Score 0 0 0 0 0  Altered sleeping - 0 - 0 -  Tired, decreased energy - 0 - 0 -  Change in appetite - 0 - 0 -  Feeling bad or failure about yourself  - 0 - 0 -  Trouble concentrating - 0 - 0 -  Moving slowly or fidgety/restless - 0 - 0 -  Suicidal thoughts - 0 - 0 -  PHQ-9 Score - 0 - 0 -  Difficult doing work/chores - Not difficult at all - Not difficult at all -  ADLs: no help needed  Functionality: very high fuxn   Care team : Nirvaan Frett-pcp Agbor-Etang- cardiology Kowalski-derm [place]-oph  HTN bp is stable today  No cp or palpitations or headaches or edema  No side effects to medicines  BP Readings from Last 3 Encounters:  10/26/21 132/76  03/12/21 (!) 142/87  11/16/20 120/76     Hctz 25 mg daily  Verapamil SR 120 mg daily Ziac 2.5-6.25 mg daily   Pulse Readings from Last 3 Encounters:  10/26/21 (!) 56  11/16/20 (!) 55  06/04/20 (!) 56      H/o a fib - no symptoms/rate controlled  Eliquis Card care    Subclinical hypothyroid Lab Results  Component Value Date   TSH 3.63 10/18/2021     Hyperlipidemia Lab Results   Component Value Date   CHOL 260 (H) 10/18/2021   CHOL 219 (H) 05/07/2020   CHOL 233 (H) 03/19/2019   Lab Results  Component Value Date   HDL 60.00 10/18/2021   HDL 50.10 05/07/2020   HDL 62.30 03/19/2019   Lab Results  Component Value Date   LDLCALC 172 (H) 10/18/2021   LDLCALC 142 (H) 05/07/2020   LDLCALC 154 (H) 03/19/2019   Lab Results  Component Value Date   TRIG 141.0 10/18/2021   TRIG 134.0 05/07/2020   TRIG 84.0 03/19/2019   Lab Results  Component Value Date   CHOLHDL 4 10/18/2021   CHOLHDL 4 05/07/2020   CHOLHDL 4 03/19/2019   Lab Results  Component Value Date   LDLDIRECT 175.7 09/09/2013   LDLDIRECT 184.5 08/01/2012   LDLDIRECT 169.6 10/20/2011   Declined medication in the past /still declines  Not paying attention to diet   No fatty meats  No red meat  Very rarely eats fried food  Shrimp -very rarely   Eating too much cheese and mayo   The 10-year ASCVD risk score (Arnett DK, et al., 2019) is: 32.7%   Values used to calculate the score:     Age: 71 years     Sex: Female     Is Non-Hispanic African American: No     Diabetic: No     Tobacco smoker: No     Systolic Blood Pressure: 389 mmHg     Is BP treated: Yes     HDL Cholesterol: 60 mg/dL     Total Cholesterol: 260 mg/dL    Other labs Lab Results  Component Value Date   CREATININE 0.79 10/18/2021   BUN 22 10/18/2021   NA 141 10/18/2021   K 3.7 10/18/2021   CL 101 10/18/2021   CO2 35 (H) 10/18/2021   Lab Results  Component Value Date   ALT 13 10/18/2021   AST 18 10/18/2021   ALKPHOS 57 10/18/2021   BILITOT 0.6 10/18/2021   Lab Results  Component Value Date   WBC 4.7 10/18/2021   HGB 13.9 10/18/2021   HCT 42.5 10/18/2021   MCV 90.2 10/18/2021   PLT 224.0 10/18/2021   Patient Active Problem List   Diagnosis Date Noted   Colon cancer screening 10/26/2021   COVID-19 virus infection 03/05/2021   Paroxysmal atrial fibrillation ([place]) 05/15/2020   Subclinical hypothyroidism  01/02/2018   Obesity (BMI 30-39.9) 12/27/2016   Routine general medical examination at a health care facility 12/08/2015   GERD (gastroesophageal reflux disease) 12/08/2015   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   Estrogen deficiency 10/31/2014   Vitamin D deficiency 10/31/2014   Skin cancer screening 09/23/2013   Encounter for Medicare annual  wellness exam 09/13/2013   Screening mammogram, encounter for 04/19/2011   IRRITABLE BOWEL SYNDROME 01/22/2010   HYPERCHOLESTEROLEMIA 10/03/2007   GLAUCOMA 10/03/2007   Essential hypertension 10/03/2007   ALLERGIC RHINITIS 10/03/2007   OSTEOARTHRITIS 10/03/2007   Osteoporosis 10/03/2007   Past Medical History:  Diagnosis Date   Allergy    Arthritis    Osteoarthritis   Basal cell carcinoma 12/06/2016   L ant lat neck anteriro   Basal cell carcinoma 10/20/2016   L ant lat neck posterior   Cancer ([place])    skin   Hypertension    Osteoporosis    Squamous cell carcinoma of skin 11/23/2017   L neck lat near angle of mandible (SCCIS)   Past Surgical History:  Procedure Laterality Date   BASAL CELL CARCINOMA EXCISION  12/2016   neck   EYE SURGERY     Bilateral cataract Surgery   SQUAMOUS CELL CARCINOMA EXCISION  12/2016   neck   TONSILLECTOMY     Social History   Tobacco Use   Smoking status: Never   Smokeless tobacco: Never  Vaping Use   Vaping Use: Never used  Substance Use Topics   Alcohol use: No    Alcohol/week: 0.0 standard drinks   Drug use: No   Family History  Problem Relation Age of Onset   Cancer Father        Kidney   Heart disease Father    Cancer Paternal Grandmother    Dementia Mother    Hypertension Brother    Heart disease Maternal Uncle    Stroke Maternal Grandmother    Heart disease Maternal Grandfather    Breast cancer Neg Hx    No Known Allergies Current Outpatient Medications on File Prior to Visit  Medication Sig Dispense Refill   acetaminophen (TYLENOL) 650 MG CR tablet Take 650 mg by mouth  every 8 (eight) hours as needed. Alternates with Ibuprofen.     apixaban (ELIQUIS) 5 MG TABS tablet Take 1 tablet (5 mg total) by mouth 2 (two) times daily. 180 tablet 1   bisoprolol-hydrochlorothiazide (ZIAC) 2.5-6.25 MG tablet TAKE 1 TABLET EVERY DAY 90 tablet 0   calcium carbonate (OS-CAL) 600 MG TABS Take 600 mg by mouth daily.     chlorpheniramine (CHLOR-TRIMETON) 4 MG tablet Take 4 mg by mouth 2 (two) times daily as needed.     cholecalciferol (VITAMIN D) 1000 UNITS tablet Take 2,000 Units by mouth daily.     ciclopirox (PENLAC) 8 % solution Apply topically at bedtime. Apply over nail and surrounding skin. Apply daily over previous coat. 6.6 mL 11   GARLIC PO Take 1 capsule by mouth daily.     ketoconazole (NIZORAL) 2 % cream Apply to feet qhs for fungal infection. 60 g 11   Lifitegrast 5 % SOLN Place 1 drop into both eyes 2 (two) times a day.     loratadine (CLARITIN) 10 MG tablet Take 10 mg by mouth as directed.     Multiple Vitamins-Minerals (PRESERVISION AREDS 2) CAPS Take 1 capsule by mouth 2 (two) times a day.     phenylephrine (NEO-SYNEPHRINE) 0.125 % nasal drops Place 1 drop into the nose as needed.     Polyethyl Glycol-Propyl Glycol (SYSTANE OP) Apply to eye 2 (two) times daily as needed.     timolol (BETIMOL) 0.5 % ophthalmic solution Place 1 drop into both eyes daily.     verapamil (CALAN-SR) 120 MG CR tablet Take 1 tablet (120 mg total) by mouth at bedtime.  90 tablet 3   No current facility-administered medications on file prior to visit.     Review of Systems  Constitutional:  Negative for activity change, appetite change, fatigue, fever and unexpected weight change.  HENT:  Negative for congestion, ear pain, rhinorrhea, sinus pressure and sore throat.   Eyes:  Negative for pain, redness and visual disturbance.  Respiratory:  Negative for cough, shortness of breath and wheezing.   Cardiovascular:  Negative for chest pain and palpitations.  Gastrointestinal:  Negative for  abdominal pain, blood in stool, constipation and diarrhea.  Endocrine: Negative for polydipsia and polyuria.  Genitourinary:  Negative for dysuria, frequency and urgency.  Musculoskeletal:  Positive for arthralgias. Negative for back pain and myalgias.  Skin:  Negative for pallor and rash.  Allergic/Immunologic: Negative for environmental allergies.  Neurological:  Negative for dizziness, syncope and headaches.  Hematological:  Negative for adenopathy. Does not bruise/bleed easily.  Psychiatric/Behavioral:  Negative for decreased concentration and dysphoric mood. The patient is not nervous/anxious.       Objective:   Physical Exam Constitutional:      General: She is not in acute distress.    Appearance: Normal appearance. She is well-developed. She is obese. She is not ill-appearing or diaphoretic.  HENT:     Head: Normocephalic and atraumatic.     Right Ear: Tympanic membrane, ear canal and external ear normal.     Left Ear: Tympanic membrane, ear canal and external ear normal.     Nose: Nose normal. No congestion.     Mouth/Throat:     Mouth: Mucous membranes are moist.     Pharynx: Oropharynx is clear. No posterior oropharyngeal erythema.  Eyes:     General: No scleral icterus.    Extraocular Movements: Extraocular movements intact.     Conjunctiva/sclera: Conjunctivae normal.     Pupils: Pupils are equal, round, and reactive to light.  Neck:     Thyroid: No thyromegaly.     Vascular: No carotid bruit or JVD.  Cardiovascular:     Rate and Rhythm: Bradycardia present.     Pulses: Normal pulses.     Heart sounds: Normal heart sounds.    No gallop.  Pulmonary:     Effort: Pulmonary effort is normal. No respiratory distress.     Breath sounds: Normal breath sounds. No wheezing.     Comments: Good air exch Chest:     Chest wall: No tenderness.  Abdominal:     General: Bowel sounds are normal. There is no distension or abdominal bruit.     Palpations: Abdomen is soft. There  is no mass.     Tenderness: There is no abdominal tenderness.     Hernia: No hernia is present.  Genitourinary:    Comments: Breast exam: No mass, nodules, thickening, tenderness, bulging, retraction, inflamation, nipple discharge or skin changes noted.  No axillary or clavicular LA.     Musculoskeletal:        General: No tenderness. Normal range of motion.     Cervical back: Normal range of motion and neck supple. No rigidity. No muscular tenderness.     Right lower leg: No edema.     Left lower leg: No edema.     Comments: No kyphosis   Lymphadenopathy:     Cervical: No cervical adenopathy.  Skin:    General: Skin is warm and dry.     Coloration: Skin is not pale.     Findings: No erythema or rash.  Comments: Solar lentigines diffusely   Neurological:     Mental Status: She is alert. Mental status is at baseline.     Cranial Nerves: No cranial nerve deficit.     Motor: No abnormal muscle tone.     Coordination: Coordination normal.     Gait: Gait normal.     Deep Tendon Reflexes: Reflexes are normal and symmetric. Reflexes normal.  Psychiatric:        Mood and Affect: Mood normal.        Cognition and Memory: Cognition and memory normal.          Assessment & Plan:   Problem List Items Addressed This Visit       Cardiovascular and Mediastinum   Essential hypertension    bp in fair control at this time  BP Readings from Last 1 Encounters:  10/26/21 132/76  No changes needed Most recent labs reviewed  Disc lifstyle change with low sodium diet and exercise  Plan to continue  Hctz 25 mg daily  Verapamil SR 120 mg daily Ziac 2.5-6.25 mg daily       Relevant Medications   hydrochlorothiazide (HYDRODIURIL) 25 MG tablet   Paroxysmal atrial fibrillation (HCC)    Under care of cardiology Stable rate Continue eliquis and ziac      Relevant Medications   hydrochlorothiazide (HYDRODIURIL) 25 MG tablet     Endocrine   Subclinical hypothyroidism    Nl tsh  this visit  Lab Results  Component Value Date   TSH 3.63 10/18/2021   No clinical changes        Musculoskeletal and Integument   Osteoporosis    dexa due/ordere done for pt to schedule No falls or fractures Taking ca and D  Encouraged more exercise         Other   Colon cancer screening    cologuard ordered      Relevant Orders   Cologuard   Encounter for Medicare annual wellness exam - Primary    Reviewed health habits including diet and exercise and skin cancer prevention Reviewed appropriate screening tests for age  Also reviewed health mt list, fam hx and immunization status , as well as social and family history   See HPI Labs reviewed cologuard ordered Mammogram ordered  dexa ordered, no falls or fx Declines shingrix vaccine  Advance directive is up to date No cognitive concerns Hearing screen reviewed/pt declines addn eval or treatment utd eye/vision care No help needed with ADLs Good functionality      Estrogen deficiency   Relevant Orders   DG Bone Density   HYPERCHOLESTEROLEMIA    Disc goals for lipids and reasons to control them Rev last labs with pt Rev low sat fat diet in detail LDL up to 172 Pt declines medication  Urged strongly to watch diet and ASCVD risk score reviewed       Relevant Medications   hydrochlorothiazide (HYDRODIURIL) 25 MG tablet   Obesity (BMI 30-39.9)    Discussed how this problem influences overall health and the risks it imposes  Reviewed plan for weight loss with lower calorie diet (via better food choices and also portion control or program like weight watchers) and exercise building up to or more than 30 minutes 5 days per week including some aerobic activity         Routine general medical examination at a health care facility    Reviewed health habits including diet and exercise and skin cancer prevention Reviewed appropriate screening  tests for age  Also reviewed health mt list, fam hx and immunization  status , as well as social and family history   See HPI Labs reviewed cologuard ordered Mammogram ordered  dexa ordered, no falls or fx Declines shingrix vaccine  Advance directive is up to date No cognitive concerns Hearing screen reviewed/pt declines addn eval or treatment utd eye/vision care No help needed with ADLs Good functionality      Screening mammogram, encounter for    Screening mammogram ordered Pt to schedule      Relevant Orders   MM 3D SCREEN BREAST BILATERAL   Vitamin D deficiency    Vitamin D level is therapeutic with current supplementation Disc importance of this to bone and overall health Level 39.2 Urged to continue current supplement

## 2021-10-26 NOTE — Assessment & Plan Note (Signed)
bp in fair control at this time  BP Readings from Last 1 Encounters:  10/26/21 132/76   No changes needed Most recent labs reviewed  Disc lifstyle change with low sodium diet and exercise  Plan to continue  Hctz 25 mg daily  Verapamil SR 120 mg daily Ziac 2.5-6.25 mg daily

## 2021-10-26 NOTE — Assessment & Plan Note (Signed)
Disc goals for lipids and reasons to control them Rev last labs with pt Rev low sat fat diet in detail LDL up to 172 Pt declines medication  Urged strongly to watch diet and ASCVD risk score reviewed

## 2021-10-26 NOTE — Assessment & Plan Note (Signed)
dexa due/ordere done for pt to schedule No falls or fractures Taking ca and D  Encouraged more exercise

## 2021-10-26 NOTE — Assessment & Plan Note (Signed)
Discussed how this problem influences overall health and the risks it imposes  Reviewed plan for weight loss with lower calorie diet (via better food choices and also portion control or program like weight watchers) and exercise building up to or more than 30 minutes 5 days per week including some aerobic activity    

## 2021-10-26 NOTE — Assessment & Plan Note (Signed)
Screening mammogram ordered Pt to schedule

## 2021-10-26 NOTE — Patient Instructions (Addendum)
I ordered the cologuard test If you don't hear from someone in 2 weeks let us know   For cholesterol Avoid red meat/ fried foods/ egg yolks/ fatty breakfast meats/ butter, cheese and high fat dairy/ and shellfish   If you change your mind about medicine let us know    Call and schedule your mammogram and bone density test at [facility]   Please call the location of your choice from the menu below to schedule your Mammogram and/or Bone Density appointment.    [facility]                      Phone:  [phone], [place] [address]                                                             Services: Traditional and 3D Mammogram, [place] Bone Density                 Phone: [phone] [address], [place] [address]    Service: Bone Density ONLY   *this site does NOT perform mammograms  [facility]                        Phone:  [phone] [address], [place] [address]                                            Services:  3D Mammogram and [place] at [facility]   Phone:  [phone]   [place], [place] [address]                                            Services: [place]  and Bone Density  [place] at [facility])  Phone:  [phone]   [address] Room [place], [place] [address]                                              Services:  3D Mammogram and Bone Density

## 2021-10-26 NOTE — Assessment & Plan Note (Signed)
cologuard ordered.

## 2021-10-27 ENCOUNTER — Telehealth: Payer: Self-pay | Admitting: Family Medicine

## 2021-10-27 NOTE — Telephone Encounter (Signed)
Thanks for letting me know Ask her to update [place] in august and I will re order it

## 2021-10-27 NOTE — Telephone Encounter (Signed)
Monica Guerrero called in and stated that Dr. Glori Bickers put in a order for cologuard and when she went home and looked at papers she saw that her 15yrs aren't up until august and she then called cologuard and cancelled due to insurance will not cover it.

## 2021-10-28 NOTE — Telephone Encounter (Signed)
Pt.notified

## 2021-11-19 ENCOUNTER — Other Ambulatory Visit: Payer: Self-pay

## 2021-11-19 ENCOUNTER — Encounter: Payer: Self-pay | Admitting: Cardiology

## 2021-11-19 ENCOUNTER — Ambulatory Visit: Payer: Medicare HMO | Admitting: Cardiology

## 2021-11-19 VITALS — BP 120/70 | HR 53 | Ht 63.0 in | Wt 176.0 lb

## 2021-11-19 DIAGNOSIS — I10 Essential (primary) hypertension: Secondary | ICD-10-CM

## 2021-11-19 DIAGNOSIS — I480 Paroxysmal atrial fibrillation: Secondary | ICD-10-CM | POA: Diagnosis not present

## 2021-11-19 MED ORDER — VERAPAMIL HCL ER 120 MG PO TBCR: 120.0000 mg | EXTENDED_RELEASE_TABLET | Freq: Every day | ORAL | 1 refills | Status: DC

## 2021-11-19 NOTE — Patient Instructions (Signed)

## 2021-11-19 NOTE — Progress Notes (Signed)
?Cardiology Office Note:   ? ?Date:  11/19/2021  ? ?ID:  DRUE CAMERA, DOB 20-Jan-1941, MRN [id number] ? ?PCP:  Abner Greenspan, MD  ?Cardiologist:  None  ?Electrophysiologist:  None  ? ?Referring MD: Abner Greenspan, MD  ? ?Chief Complaint  ?Patient presents with  ? Follow-up  ?  12 month F/U-No new cardiac concerns  ? ? ?History of Present Illness:   ? ?Monica Guerrero is a 81 y.o. female with a hx of paroxysmal atrial fibrillation, hyperlipidemia, hypertension, who presents for follow-up. ? ?Being seen for hypertension, paroxysmal atrial fibrillation. States doing well , has rare palpitations lasting a few minutes. Denies cp, sob, dizziness. Feels well ortherwise. Bp well controlled.  ? ? ?Prior notes ?Echocardiogram 10/2019 EF 60 to 65% ?Cardiac monitor 09/2019 SVT and atrial fibrillation noted ? ?Past Medical History:  ?Diagnosis Date  ? Allergy   ? Arthritis   ? Osteoarthritis  ? Basal cell carcinoma 12/06/2016  ? L ant lat neck anteriro  ? Basal cell carcinoma 10/20/2016  ? L ant lat neck posterior  ? Cancer [facility])   ? skin  ? Hypertension   ? Osteoporosis   ? 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 L neck lat near angle of mandible (SCCIS)  ? ? ?Past Surgical History:  ?Procedure Laterality Date  ? BASAL CELL CARCINOMA EXCISION  12/2016  ? neck  ? EYE SURGERY    ? Bilateral cataract Surgery  ? SQUAMOUS CELL CARCINOMA EXCISION  12/2016  ? neck  ? TONSILLECTOMY    ? TOOTH EXTRACTION    ? ? ?Current Medications: ?Current Meds  ?Medication Sig  ? acetaminophen (TYLENOL) 650 MG CR tablet Take 650 mg by mouth every 8 (eight) hours as needed. Alternates with Ibuprofen.  ? apixaban (ELIQUIS) 5 MG TABS tablet Take 1 tablet (5 mg total) by mouth 2 (two) times daily.  ? bisoprolol-hydrochlorothiazide (ZIAC) 2.5-6.25 MG tablet TAKE 1 TABLET EVERY DAY  ? calcium carbonate (OS-CAL) 600 MG TABS Take 600 mg by mouth daily.  ? chlorpheniramine (CHLOR-TRIMETON) 4 MG tablet Take 4 mg by mouth 2 (two) times daily as needed.  ?  cholecalciferol (VITAMIN D) 1000 UNITS tablet Take 2,000 Units by mouth daily.  ? ciclopirox (PENLAC) 8 % solution Apply topically at bedtime. Apply over nail and surrounding skin. Apply daily over previous coat.  ? GARLIC PO Take 1 capsule by mouth daily.  ? hydrochlorothiazide (HYDRODIURIL) 25 MG tablet Take 1 tablet (25 mg total) by mouth daily.  ? ketoconazole (NIZORAL) 2 % cream Apply to feet qhs for fungal infection.  ? Lifitegrast 5 % SOLN Place 1 drop into both eyes 2 (two) times a day.  ? loratadine (CLARITIN) 10 MG tablet Take 10 mg by mouth as directed.  ? Multiple Vitamins-Minerals (PRESERVISION AREDS 2) CAPS Take 1 capsule by mouth 2 (two) times a day.  ? phenylephrine (NEO-SYNEPHRINE) 0.125 % nasal drops Place 1 drop into the nose as needed.  ? Polyethyl Glycol-Propyl Glycol (SYSTANE OP) Apply to eye 2 (two) times daily as needed.  ? timolol (BETIMOL) 0.5 % ophthalmic solution Place 1 drop into both eyes daily.  ? [DISCONTINUED] verapamil (CALAN-SR) 120 MG CR tablet Take 1 tablet (120 mg total) by mouth at bedtime.  ?  ? ?Allergies:   Patient has no known allergies.  ? ?Social History  ? ?Socioeconomic History  ? Marital status: Single  ?  Spouse name: Not on file  ? Number of children:  2  ? Years of education: Not on file  ? Highest education level: Not on file  ?Occupational History  ? Occupation: Secretary/administrator  ?Tobacco Use  ? Smoking status: Never  ? Smokeless tobacco: Never  ?Vaping Use  ? Vaping Use: Never used  ?Substance and Sexual Activity  ? Alcohol use: No  ?  Alcohol/week: 0.0 standard drinks  ? Drug use: No  ? Sexual activity: Not Currently  ?Other Topics Concern  ? Not on file  ?Social History Narrative  ? Not on file  ? ?Social Determinants of Health  ? ?Financial Resource Strain: Not on file  ?Food Insecurity: Not on file  ?Transportation Needs: Not on file  ?Physical Activity: Not on file  ?Stress: Not on file  ?Social Connections: Not on file  ?  ? ?Family History: ?The patient's family  history includes Cancer in her father and paternal grandmother; Dementia in her mother; Heart disease in her father, maternal grandfather, and maternal uncle; Hypertension in her brother; Stroke in her maternal grandmother. There is no history of Breast cancer. ? ?ROS:   ?Please see the history of present illness.    ? All other systems reviewed and are negative. ? ?EKGs/Labs/Other Studies Reviewed:   ? ? ? ?EKG:  EKG is  ordered today.  The ekg ordered today demonstrates sinus bradycardia, heart rate 53 ? ?Recent Labs: ?10/18/2021: ALT 13; BUN 22; Creatinine, Ser 0.79; Hemoglobin 13.9; Platelets 224.0; Potassium 3.7; Sodium 141; TSH 3.63  ?Recent Lipid Panel ?   ?Component Value Date/Time  ? CHOL 260 (H) 10/18/2021 0914  ? TRIG 141.0 10/18/2021 0914  ? HDL 60.00 10/18/2021 0914  ? CHOLHDL 4 10/18/2021 0914  ? VLDL 28.2 10/18/2021 0914  ? [place] 172 (H) 10/18/2021 0914  ? LDLDIRECT 175.7 09/09/2013 0934  ? ? ?Physical Exam:   ? ?VS:  BP 120/70 (BP Location: Left Arm, Patient Position: Sitting, Cuff Size: Large)   Pulse (!) 53   Ht '5\' 3"'$  (1.6 m)   Wt 176 lb (79.8 kg)   SpO2 98%   BMI 31.18 kg/m?    ? ?Wt Readings from Last 3 Encounters:  ?11/19/21 176 lb (79.8 kg)  ?10/26/21 176 lb (79.8 kg)  ?11/16/20 180 lb 8 oz (81.9 kg)  ?  ? ?GEN:  Well nourished, well developed in no acute distress ?HEENT: Normal ?NECK: No JVD; No carotid bruits ?LYMPHATICS: No lymphadenopathy ?CARDIAC: RRR, no murmurs, rubs, gallops ?RESPIRATORY:  Clear to auscultation without rales, wheezing or rhonchi  ?ABDOMEN: Soft, non-tender, non-distended ?MUSCULOSKELETAL:  No edema; No deformity  ?SKIN: Warm and dry ?NEUROLOGIC:  Alert and oriented x 3 ?PSYCHIATRIC:  Normal affect  ? ?ASSESSMENT:   ? ?1. Paroxysmal atrial fibrillation (HCC)   ?2. Essential hypertension   ? ? ?PLAN:   ? ?In order of problems listed above: ? ?paroxysmal atrial fibrillation.  Currently in sinus bradycardia.  CHA2DS2-VASc score of 4 (age, htn, gender).  Continue  Eliquis 5 mg twice daily, verapamil ?History of hypertension, BP normal,  continue verapamil, HCTZ. ? ?Follow-up yearly. ? ?Total encounter time 35 minutes ? Greater than 50% was spent in counseling and coordination of care with the patient, medication management ? ? ?This note was generated in part or whole with voice recognition software. Voice recognition is usually quite accurate but there are transcription errors that can and very often do occur. I apologize for any typographical errors that were not detected and corrected. ? ?Medication Adjustments/Labs and Tests Ordered: ?Current medicines  are reviewed at length with the patient today.  Concerns regarding medicines are outlined above.  ?Orders Placed This Encounter  ?Procedures  ? EKG 12-Lead  ? ?Meds ordered this encounter  ?Medications  ? verapamil (CALAN-SR) 120 MG CR tablet  ?  Sig: Take 1 tablet (120 mg total) by mouth at bedtime.  ?  Dispense:  90 tablet  ?  Refill:  1  ? ? ?Patient Instructions  ?Medication Instructions:  ?Your physician recommends that you continue on your current medications as directed. Please refer to the Current Medication list given to you today. ? ?*If you need a refill on your cardiac medications before your next appointment, please call your pharmacy* ? ? ?Lab Work: ?None ordered ?If you have labs (blood work) drawn today and your tests are completely normal, you will receive your results only by: ?MyChart Message (if you have MyChart) OR ?A paper copy in the mail ?If you have any lab test that is abnormal or we need to change your treatment, we will call you to review the results. ? ? ?Testing/Procedures: ?None ordered ? ? ?Follow-Up: ?At [facility], you and your health needs are our priority.  As part of our continuing mission to provide you with exceptional heart care, we have created designated Provider Care Teams.  These Care Teams include your primary Cardiologist (physician) and Advanced Practice Providers (APPs -   Physician Assistants and Nurse Practitioners) who all work together to provide you with the care you need, when you need it. ? ?We recommend signing up for the patient portal called "MyChart".  Sign up informatio

## 2021-12-08 ENCOUNTER — Ambulatory Visit (INDEPENDENT_AMBULATORY_CARE_PROVIDER_SITE_OTHER): Payer: Medicare HMO | Admitting: Dermatology

## 2021-12-08 DIAGNOSIS — I8393 Asymptomatic varicose veins of bilateral lower extremities: Secondary | ICD-10-CM

## 2021-12-08 DIAGNOSIS — L821 Other seborrheic keratosis: Secondary | ICD-10-CM | POA: Diagnosis not present

## 2021-12-08 DIAGNOSIS — D1800 Hemangioma unspecified site: Secondary | ICD-10-CM | POA: Diagnosis not present

## 2021-12-08 DIAGNOSIS — L814 Other melanin hyperpigmentation: Secondary | ICD-10-CM

## 2021-12-08 DIAGNOSIS — B351 Tinea unguium: Secondary | ICD-10-CM

## 2021-12-08 DIAGNOSIS — D229 Melanocytic nevi, unspecified: Secondary | ICD-10-CM | POA: Diagnosis not present

## 2021-12-08 DIAGNOSIS — L570 Actinic keratosis: Secondary | ICD-10-CM

## 2021-12-08 DIAGNOSIS — Z1283 Encounter for screening for malignant neoplasm of skin: Secondary | ICD-10-CM

## 2021-12-08 DIAGNOSIS — Z85828 Personal history of other malignant neoplasm of skin: Secondary | ICD-10-CM

## 2021-12-08 DIAGNOSIS — L578 Other skin changes due to chronic exposure to nonionizing radiation: Secondary | ICD-10-CM

## 2021-12-08 DIAGNOSIS — L820 Inflamed seborrheic keratosis: Secondary | ICD-10-CM | POA: Diagnosis not present

## 2021-12-08 DIAGNOSIS — B353 Tinea pedis: Secondary | ICD-10-CM | POA: Diagnosis not present

## 2021-12-08 DIAGNOSIS — Z872 Personal history of diseases of the skin and subcutaneous tissue: Secondary | ICD-10-CM

## 2021-12-08 DIAGNOSIS — L918 Other hypertrophic disorders of the skin: Secondary | ICD-10-CM

## 2021-12-08 MED ORDER — TAVABOROLE 5 % EX SOLN: CUTANEOUS | 11 refills | Status: DC

## 2021-12-08 MED ORDER — KETOCONAZOLE 2 % EX CREA: TOPICAL_CREAM | CUTANEOUS | 11 refills | Status: DC

## 2021-12-08 NOTE — Patient Instructions (Signed)

## 2021-12-08 NOTE — Progress Notes (Signed)
? ?Follow-Up Visit ?  ?Subjective  ?Monica Guerrero is a 81 y.o. female who presents for the following: Annual Exam (Hx BCC, SCC, AK's - patient has noticed a lesion on the nasal tip and R cheek that she would like checked today). The patient presents for Total-Body Skin Exam (TBSE) for skin cancer screening and mole check.  The patient has spots, moles and lesions to be evaluated, some may be new or changing and the patient has concerns that these could be cancer. ? ?The following portions of the chart were reviewed this encounter and updated as appropriate:  ? Tobacco  Allergies  Meds  Problems  Med Hx  Surg Hx  Fam Hx   ?  ?Review of Systems:  No other skin or systemic complaints except as noted in HPI or Assessment and Plan. ? ?Objective  ?Well appearing patient in no apparent distress; mood and affect are within normal limits. ? ?A full examination was performed including scalp, head, eyes, ears, nose, lips, neck, chest, axillae, abdomen, back, buttocks, bilateral upper extremities, bilateral lower extremities, hands, feet, fingers, toes, fingernails, and toenails. All findings within normal limits unless otherwise noted below. ? ?Nasal tip x 1 ?Erythematous thin papules/macules with gritty scale.  ? ?R cheek x 4 (4) ?Erythematous stuck-on, waxy papule or plaque ? ?B/L foot and toenails ?Toenail dystrophy and scale of the feet. ? ? ?Assessment & Plan  ?AK (actinic keratosis) ?Nasal tip x 1 ? ?Destruction of lesion - Nasal tip x 1 ?Complexity: simple   ?Destruction method: cryotherapy   ?Informed consent: discussed and consent obtained   ?Timeout:  patient name, date of birth, surgical site, and procedure verified ?Lesion destroyed using liquid nitrogen: Yes   ?Region frozen until ice ball extended beyond lesion: Yes   ?Outcome: patient tolerated procedure well with no complications   ?Post-procedure details: wound care instructions given   ? ?Inflamed seborrheic keratosis (4) ?R cheek x 4 ? ?Destruction  of lesion - R cheek x 4 ?Complexity: simple   ?Destruction method: cryotherapy   ?Informed consent: discussed and consent obtained   ?Timeout:  patient name, date of birth, surgical site, and procedure verified ?Lesion destroyed using liquid nitrogen: Yes   ?Region frozen until ice ball extended beyond lesion: Yes   ?Outcome: patient tolerated procedure well with no complications   ?Post-procedure details: wound care instructions given   ? ?Tinea unguium ?B/L foot and toenails ? ?With tinea pedis -recent labs WNL, no hx of liver disease. Chronic and persistent condition with duration or expected duration over one year. Condition is symptomatic / bothersome to patient. Not to goal. ? ?Patient defers oral treatment today and states Ciclopirox is not as effective as Kerydin solution.  ? ?Continue Ketoconazole 2% cream to feet QHS.  ? ?Start Kerydin solution QHS.  ? ?Tavaborole 5 % SOLN - B/L foot and toenails ?Apply to the toenails QHS. ? ?Tinea pedis of both feet ? ?Related Medications ?ketoconazole (NIZORAL) 2 % cream ?Apply to feet qhs for fungal infection. ? ?Lentigines ?- Scattered tan macules ?- Due to sun exposure ?- Benign-appearing, observe ?- Recommend daily broad spectrum sunscreen SPF 30+ to sun-exposed areas, reapply every 2 hours as needed. ?- Call for any changes ? ?Seborrheic Keratoses ?- Stuck-on, waxy, tan-brown papules and/or plaques  ?- Benign-appearing ?- Discussed benign etiology and prognosis. ?- Observe ?- Call for any changes ? ?Melanocytic Nevi ?- Tan-brown and/or pink-flesh-colored symmetric macules and papules ?- Benign appearing on exam today ?- Observation ?-  Call clinic for new or changing moles ?- Recommend daily use of broad spectrum spf 30+ sunscreen to sun-exposed areas.  ? ?Hemangiomas ?- Red papules ?- Discussed benign nature ?- Observe ?- Call for any changes ? ?Actinic Damage ?- Chronic condition, secondary to cumulative UV/sun exposure ?- diffuse scaly erythematous macules with  underlying dyspigmentation ?- Recommend daily broad spectrum sunscreen SPF 30+ to sun-exposed areas, reapply every 2 hours as needed.  ?- Staying in the shade or wearing long sleeves, sun glasses (UVA+UVB protection) and wide brim hats (4-inch brim around the entire circumference of the hat) are also recommended for sun protection.  ?- Call for new or changing lesions. ? ?History of Basal Cell Carcinoma of the Skin ?- No evidence of recurrence today ?- Recommend regular full body skin exams ?- Recommend daily broad spectrum sunscreen SPF 30+ to sun-exposed areas, reapply every 2 hours as needed.  ?- Call if any new or changing lesions are noted between office visits ? ?History of Squamous Cell Carcinoma of the Skin ?- No evidence of recurrence today ?- No lymphadenopathy ?- Recommend regular full body skin exams ?- Recommend daily broad spectrum sunscreen SPF 30+ to sun-exposed areas, reapply every 2 hours as needed.  ?- Call if any new or changing lesions are noted between office visits ? ?Acrochordons (Skin Tags) ?- Fleshy, skin-colored pedunculated papules ?- Benign appearing.  ?- Observe. ?- If desired, they can be removed with an in office procedure that is not covered by insurance. ?- Please call the clinic if you notice any new or changing lesions. ? ?Varicose Veins/Spider Veins ?- Dilated blue, purple or red veins at the lower extremities ?- Reassured ?- Smaller vessels can be treated by sclerotherapy (a procedure to inject a medicine into the veins to make them disappear) if desired, but the treatment is not covered by insurance. Larger vessels may be covered if symptomatic and we would refer to vascular surgeon if treatment desired. ? ?Skin cancer screening performed today. ? ?Return in about 1 year (around 12/09/2022) for TBSE. ? ?I, Rudell Cobb, CMA, am acting as scribe for Sarina Ser, MD . ?Documentation: I have reviewed the above documentation for accuracy and completeness, and I agree with the  above. ? ?Sarina Ser, MD ? ?

## 2021-12-09 ENCOUNTER — Encounter: Payer: Self-pay | Admitting: Dermatology

## 2021-12-13 ENCOUNTER — Ambulatory Visit
Admission: RE | Admit: 2021-12-13 | Discharge: 2021-12-13 | Disposition: A | Discharge status: Home / Self Care | Payer: Medicare HMO | Source: Ambulatory Visit | Attending: Family Medicine | Admitting: Family Medicine

## 2021-12-13 DIAGNOSIS — M8588 Other specified disorders of bone density and structure, other site: Secondary | ICD-10-CM | POA: Diagnosis not present

## 2021-12-13 DIAGNOSIS — M810 Age-related osteoporosis without current pathological fracture: Secondary | ICD-10-CM | POA: Diagnosis not present

## 2021-12-13 DIAGNOSIS — Z1231 Encounter for screening mammogram for malignant neoplasm of breast: Secondary | ICD-10-CM | POA: Diagnosis not present

## 2021-12-13 DIAGNOSIS — E2839 Other primary ovarian failure: Secondary | ICD-10-CM | POA: Diagnosis not present

## 2021-12-13 DIAGNOSIS — Z780 Asymptomatic menopausal state: Secondary | ICD-10-CM | POA: Diagnosis not present

## 2022-02-05 ENCOUNTER — Other Ambulatory Visit: Payer: Self-pay | Admitting: Family Medicine

## 2022-02-28 DIAGNOSIS — H353132 Nonexudative age-related macular degeneration, bilateral, intermediate dry stage: Secondary | ICD-10-CM | POA: Diagnosis not present

## 2022-02-28 DIAGNOSIS — Z0100 Encounter for examination of eyes and vision without abnormal findings: Secondary | ICD-10-CM | POA: Diagnosis not present

## 2022-02-28 DIAGNOSIS — H401131 Primary open-angle glaucoma, bilateral, mild stage: Secondary | ICD-10-CM | POA: Diagnosis not present

## 2022-02-28 DIAGNOSIS — H35371 Puckering of macula, right eye: Secondary | ICD-10-CM | POA: Diagnosis not present

## 2022-04-12 ENCOUNTER — Other Ambulatory Visit: Payer: Self-pay | Admitting: *Deleted

## 2022-04-12 DIAGNOSIS — I480 Paroxysmal atrial fibrillation: Secondary | ICD-10-CM

## 2022-04-12 MED ORDER — APIXABAN 5 MG PO TABS: 5.0000 mg | ORAL_TABLET | Freq: Two times a day (BID) | ORAL | 1 refills | Status: DC

## 2022-04-12 NOTE — Telephone Encounter (Signed)
Eliquis '5mg'$  refill request received. Patient is 81 years old, weight-79.8kg, Crea-0.79 on 10/18/2021, Diagnosis-Afib, and last seen by Dr. Garen Lah on 11/19/2021. Dose is appropriate based on dosing criteria. Will send in refill to requested pharmacy.

## 2022-05-23 ENCOUNTER — Other Ambulatory Visit: Payer: Self-pay | Admitting: *Deleted

## 2022-05-23 MED ORDER — VERAPAMIL HCL ER 120 MG PO TBCR: 120.0000 mg | EXTENDED_RELEASE_TABLET | Freq: Every day | ORAL | 2 refills | Status: DC

## 2022-06-02 ENCOUNTER — Encounter: Payer: Self-pay | Admitting: Family Medicine

## 2022-06-03 NOTE — Telephone Encounter (Signed)
Copy of immunizations mailed, will route message to PCP to advise on the RSV

## 2022-06-14 DIAGNOSIS — Z1211 Encounter for screening for malignant neoplasm of colon: Secondary | ICD-10-CM | POA: Diagnosis not present

## 2022-06-22 LAB — COLOGUARD: COLOGUARD: NEGATIVE

## 2022-07-06 ENCOUNTER — Telehealth: Payer: Self-pay | Admitting: Cardiology

## 2022-07-06 NOTE — Telephone Encounter (Signed)
Patient dropped off PAF, placed in box °

## 2022-07-14 NOTE — Telephone Encounter (Signed)
Received response from [employer], patients Eliquis PAF has been approved from 07/12/22-09/04/22.

## 2022-07-27 ENCOUNTER — Other Ambulatory Visit: Payer: Self-pay | Admitting: Family Medicine

## 2022-08-30 DIAGNOSIS — H401131 Primary open-angle glaucoma, bilateral, mild stage: Secondary | ICD-10-CM | POA: Diagnosis not present

## 2022-09-09 DIAGNOSIS — H04123 Dry eye syndrome of bilateral lacrimal glands: Secondary | ICD-10-CM | POA: Diagnosis not present

## 2022-09-09 DIAGNOSIS — H401131 Primary open-angle glaucoma, bilateral, mild stage: Secondary | ICD-10-CM | POA: Diagnosis not present

## 2022-09-09 DIAGNOSIS — H353132 Nonexudative age-related macular degeneration, bilateral, intermediate dry stage: Secondary | ICD-10-CM | POA: Diagnosis not present

## 2022-10-22 ENCOUNTER — Other Ambulatory Visit: Payer: Self-pay | Admitting: Family Medicine

## 2022-11-09 ENCOUNTER — Telehealth: Payer: Self-pay | Admitting: Family Medicine

## 2022-11-09 DIAGNOSIS — I10 Essential (primary) hypertension: Secondary | ICD-10-CM

## 2022-11-09 DIAGNOSIS — E7800 Pure hypercholesterolemia, unspecified: Secondary | ICD-10-CM

## 2022-11-09 DIAGNOSIS — M810 Age-related osteoporosis without current pathological fracture: Secondary | ICD-10-CM

## 2022-11-09 DIAGNOSIS — E038 Other specified hypothyroidism: Secondary | ICD-10-CM

## 2022-11-09 DIAGNOSIS — E559 Vitamin D deficiency, unspecified: Secondary | ICD-10-CM

## 2022-11-09 NOTE — Telephone Encounter (Signed)
-----   Message from Velna Hatchet, RT sent at 10/25/2022  2:08 PM EST ----- Regarding: Thu 3/7 lab Patient is scheduled for cpx, please order future labs.  Thanks, Anda Kraft

## 2022-11-10 ENCOUNTER — Other Ambulatory Visit (INDEPENDENT_AMBULATORY_CARE_PROVIDER_SITE_OTHER): Payer: Medicare HMO

## 2022-11-10 DIAGNOSIS — E559 Vitamin D deficiency, unspecified: Secondary | ICD-10-CM

## 2022-11-10 DIAGNOSIS — E038 Other specified hypothyroidism: Secondary | ICD-10-CM

## 2022-11-10 DIAGNOSIS — I10 Essential (primary) hypertension: Secondary | ICD-10-CM | POA: Diagnosis not present

## 2022-11-10 DIAGNOSIS — E7800 Pure hypercholesterolemia, unspecified: Secondary | ICD-10-CM | POA: Diagnosis not present

## 2022-11-10 LAB — CBC WITH DIFFERENTIAL/PLATELET
Basophils Absolute: 0 10*3/uL (ref 0.0–0.1)
Basophils Relative: 0.3 % (ref 0.0–3.0)
Eosinophils Absolute: 0.1 10*3/uL (ref 0.0–0.7)
Eosinophils Relative: 2 % (ref 0.0–5.0)
HCT: 43.7 % (ref 36.0–46.0)
Hemoglobin: 14.5 g/dL (ref 12.0–15.0)
Lymphocytes Relative: 34.6 % (ref 12.0–46.0)
Lymphs Abs: 1.4 10*3/uL (ref 0.7–4.0)
MCHC: 33.3 g/dL (ref 30.0–36.0)
MCV: 91.2 fl (ref 78.0–100.0)
Monocytes Absolute: 0.4 10*3/uL (ref 0.1–1.0)
Monocytes Relative: 10.6 % (ref 3.0–12.0)
Neutro Abs: 2.2 10*3/uL (ref 1.4–7.7)
Neutrophils Relative %: 52.5 % (ref 43.0–77.0)
Platelets: 235 10*3/uL (ref 150.0–400.0)
RBC: 4.79 Mil/uL (ref 3.87–5.11)
RDW: 13.3 % (ref 11.5–15.5)
WBC: 4.1 10*3/uL (ref 4.0–10.5)

## 2022-11-10 LAB — COMPREHENSIVE METABOLIC PANEL
ALT: 14 U/L (ref 0–35)
AST: 20 U/L (ref 0–37)
Albumin: 4.2 g/dL (ref 3.5–5.2)
Alkaline Phosphatase: 56 U/L (ref 39–117)
BUN: 18 mg/dL (ref 6–23)
CO2: 33 mEq/L — ABNORMAL HIGH (ref 19–32)
Calcium: 10.2 mg/dL (ref 8.4–10.5)
Chloride: 100 mEq/L (ref 96–112)
Creatinine, Ser: 0.82 mg/dL (ref 0.40–1.20)
GFR: 67.27 mL/min (ref >60.00)
Glucose, Bld: 94 mg/dL (ref 70–99)
Potassium: 3.4 mEq/L — ABNORMAL LOW (ref 3.5–5.1)
Sodium: 142 mEq/L (ref 135–145)
Total Bilirubin: 0.6 mg/dL (ref 0.2–1.2)
Total Protein: 6.8 g/dL (ref 6.0–8.3)

## 2022-11-10 LAB — LIPID PANEL
Cholesterol: 244 mg/dL — ABNORMAL HIGH (ref 0–200)
HDL: 63 mg/dL (ref >39.00)
LDL Cholesterol: 157 mg/dL — ABNORMAL HIGH (ref 0–99)
NonHDL: 180.71
Total CHOL/HDL Ratio: 4
Triglycerides: 119 mg/dL (ref 0.0–149.0)
VLDL: 23.8 mg/dL (ref 0.0–40.0)

## 2022-11-10 LAB — TSH: TSH: 3.05 u[IU]/mL (ref 0.35–5.50)

## 2022-11-10 LAB — T4, FREE: Free T4: 0.78 ng/dL (ref 0.60–1.60)

## 2022-11-10 LAB — VITAMIN D 25 HYDROXY (VIT D DEFICIENCY, FRACTURES): VITD: 42.22 ng/mL (ref 30.00–100.00)

## 2022-11-16 ENCOUNTER — Ambulatory Visit (INDEPENDENT_AMBULATORY_CARE_PROVIDER_SITE_OTHER): Payer: Medicare HMO

## 2022-11-16 VITALS — Ht 63.0 in | Wt 175.0 lb

## 2022-11-16 DIAGNOSIS — Z0000 Encounter for general adult medical examination without abnormal findings: Secondary | ICD-10-CM

## 2022-11-16 NOTE — Progress Notes (Signed)
I connected with  Monica Guerrero on 11/16/22 by a audio enabled telemedicine application and verified that I am speaking with the correct person using two identifiers.  Patient Location: Home  Provider Location: Home Office  I discussed the limitations of evaluation and management by telemedicine. The patient expressed understanding and agreed to proceed.  Subjective:   Monica Guerrero is a 82 y.o. female who presents for Medicare Annual (Subsequent) preventive examination.  Review of Systems      Cardiac Risk Factors include: advanced age (>89mn, >>38women);hypertension;sedentary lifestyle     Objective:    Today's Vitals   11/16/22 1011  Weight: 175 lb (79.4 kg)  Height: '5\' 3"'$  (1.6 m)   Body mass index is 31 kg/m.     11/16/2022   10:19 AM 05/06/2020    1:44 PM 12/28/2017    8:25 AM 12/22/2016    2:08 PM  Advanced Directives  Does Patient Have a Medical Advance Directive? Yes Yes Yes Yes  Type of AParamedicof [place]aver[place]Living will [place]Living will HRafter [place] RanchLiving will H[place]ersLiving will  Does patient want to make changes to medical advance directive?   No - Patient declined   Copy of HLeedsin Chart? No - copy requested No - copy requested Yes No - copy requested  Would patient like information on creating a medical advance directive?   No - Patient declined     Current Medications (verified) Outpatient Encounter Medications as of 11/16/2022  Medication Sig   acetaminophen (TYLENOL) 650 MG CR tablet Take 650 mg by mouth every 8 (eight) hours as needed. Alternates with Ibuprofen.   apixaban (ELIQUIS) 5 MG TABS tablet Take 1 tablet (5 mg total) by mouth 2 (two) times daily.   bisoprolol-hydrochlorothiazide (ZIAC) 2.5-6.25 MG tablet TAKE 1 TABLET EVERY DAY   calcium carbonate (OS-CAL) 600 MG TABS Take 600 mg by mouth daily.   chlorpheniramine (CHLOR-TRIMETON) 4 MG  tablet Take 4 mg by mouth 2 (two) times daily as needed.   cholecalciferol (VITAMIN D) 1000 UNITS tablet Take 2,000 Units by mouth daily.   GARLIC PO Take 1 capsule by mouth daily.   hydrochlorothiazide (HYDRODIURIL) 25 MG tablet Take 1 tablet (25 mg total) by mouth daily.   ketoconazole (NIZORAL) 2 % cream Apply to feet qhs for fungal infection.   Lifitegrast 5 % SOLN Place 1 drop into both eyes 2 (two) times a day.   Multiple Vitamins-Minerals (PRESERVISION AREDS 2) CAPS Take 1 capsule by mouth 2 (two) times a day.   phenylephrine (NEO-SYNEPHRINE) 0.125 % nasal drops Place 1 drop into the nose as needed.   Polyethyl Glycol-Propyl Glycol (SYSTANE OP) Apply to eye 2 (two) times daily as needed.   Tavaborole 5 % SOLN Apply to the toenails QHS.   timolol (BETIMOL) 0.5 % ophthalmic solution Place 1 drop into both eyes daily.   verapamil (CALAN-SR) 120 MG CR tablet Take 1 tablet (120 mg total) by mouth at bedtime.   loratadine (CLARITIN) 10 MG tablet Take 10 mg by mouth as directed.   No facility-administered encounter medications on file as of 11/16/2022.    Allergies (verified) Patient has no known allergies.   History: Past Medical History:  Diagnosis Date   Allergy    Arthritis    Osteoarthritis   Basal cell carcinoma 12/06/2016   L ant lat neck anteriro   Basal cell carcinoma 10/20/2016   L ant lat  neck posterior   Cancer ([place])    skin   Hypertension    Osteoporosis    Squamous cell carcinoma of skin 11/23/2017   L neck lat near angle of mandible (SCCIS)   Past Surgical History:  Procedure Laterality Date   BASAL CELL CARCINOMA EXCISION  12/2016   neck   EYE SURGERY     Bilateral cataract Surgery   SQUAMOUS CELL CARCINOMA EXCISION  12/2016   neck   TONSILLECTOMY     TOOTH EXTRACTION     Family History  Problem Relation Age of Onset   Cancer Father        Kidney   Heart disease Father    Cancer Paternal Grandmother    Dementia Mother    Hypertension Brother     Heart disease Maternal Uncle    Stroke Maternal Grandmother    Heart disease Maternal Grandfather    Breast cancer Neg Hx    Social History   Socioeconomic History   Marital status: Single    Spouse name: Not on file   Number of children: 2   Years of education: Not on file   Highest education level: Not on file  Occupational History   Occupation: Bank Teller  Tobacco Use   Smoking status: Never   Smokeless tobacco: Never  Vaping Use   Vaping Use: Never used  Substance and Sexual Activity   Alcohol use: No    Alcohol/week: 0.0 standard drinks of alcohol   Drug use: No   Sexual activity: Not Currently  Other Topics Concern   Not on file  Social History Narrative   Not on file   Social Determinants of Health   Financial Resource Strain: Low Risk  (11/16/2022)   Overall Financial Resource Strain (CARDIA)    Difficulty of Paying Living Expenses: Not hard at all  Food Insecurity: No Food Insecurity (11/16/2022)   Hunger Vital Sign    Worried About Running Out of Food in the Last Year: Never true    Ran Out of Food in the Last Year: Never true  Transportation Needs: No Transportation Needs (11/16/2022)   PRAPARE - Hydrologist (Medical): No    Lack of Transportation (Non-Medical): No  Physical Activity: Inactive (11/16/2022)   Exercise Vital Sign    Days of Exercise per Week: 0 days    Minutes of Exercise per Session: 0 min  Stress: No Stress Concern Present (11/16/2022)   [place]    Feeling of Stress : Not at all  Social Connections: Moderately Integrated (11/16/2022)   Social Connection and Isolation Panel [NHANES]    Frequency of Communication with Friends and Family: More than three times a week    Frequency of Social Gatherings with Friends and Family: Three times a week    Attends Religious Services: More than 4 times per year    Active Member of Clubs or Organizations:  Yes    Attends Music therapist: More than 4 times per year    Marital Status: Divorced    Tobacco Counseling Counseling given: Not Answered   Clinical Intake:  Pre-visit preparation completed: Yes  Pain : No/denies pain     Nutritional Risks: None Diabetes: No  How often do you need to have someone help you when you read instructions, pamphlets, or other written materials from your doctor or pharmacy?: 1 - Never  Diabetic? no  Interpreter Needed?: No  Information entered  by :: C.Maddi Collar LPN   Activities of Daily Living    11/16/2022   10:20 AM  In your present state of health, do you have any difficulty performing the following activities:  Hearing? 0  Vision? 1  Comment when reading  Difficulty concentrating or making decisions? 0  Walking or climbing stairs? 0  Dressing or bathing? 0  Doing errands, shopping? 0  Preparing Food and eating ? N  Using the Toilet? N  In the past six months, have you accidently leaked urine? N  Do you have problems with loss of bowel control? N  Managing your Medications? N  Managing your Finances? N  Housekeeping or managing your Housekeeping? N    Patient Care Team: Tower, Wynelle Fanny, MD as PCP - General Leandrew Koyanagi, MD as Referring Physician (Ophthalmology) Dannielle Karvonen, RN as [place] any recent Medical Services you may have received from other than [facility] providers in the past year (date may be approximate).     Assessment:   This is a routine wellness examination for Yanin.  Hearing/Vision screen Hearing Screening - Comments:: No aid Vision Screening - Comments:: Glasses - Monica Guerrero Vision  Dietary issues and exercise activities discussed: Current Exercise Habits: The patient does not participate in regular exercise at present, Exercise limited by: None identified   Goals Addressed             This Visit's Progress    Patient Stated       Lose 20  lbs.       Depression Screen    11/16/2022   10:19 AM 10/26/2021    9:42 AM 05/06/2020    1:44 PM 02/04/2019    2:12 PM 12/28/2017    8:23 AM 12/22/2016    1:52 PM 12/08/2015   10:28 AM  PHQ 2/9 Scores  PHQ - 2 Score 0 0 0 0 0 0 0  PHQ- 9 Score   0  0      Fall Risk    11/16/2022   10:20 AM 10/26/2021    9:27 AM 05/06/2020    1:44 PM 03/28/2019   12:28 PM 12/28/2017    8:23 AM  [place] in the past year? 0 0 1 0 Yes  Comment   tripped over something  foot became tangled in dog leash; fall caused injury to right hand; no medical treatment  Number falls in past yr: 0  0 0 1  Injury with Fall? 0  0 0 Yes  Risk for fall due to : No Fall Risks  Medication side effect    Follow up Falls prevention discussed;Falls evaluation completed Falls evaluation completed Falls evaluation completed;Falls prevention discussed Falls evaluation completed     FALL RISK PREVENTION PERTAINING TO THE HOME:  Any stairs in or around the home? Yes  If so, are there any without handrails? No  Home free of loose throw rugs in walkways, pet beds, electrical cords, etc? Yes  Adequate lighting in your home to reduce risk of falls? Yes   ASSISTIVE DEVICES UTILIZED TO PREVENT FALLS:  Life alert? No  Use of a cane, walker or w/c? No  Grab bars in the bathroom? Yes  Shower chair or bench in shower? Yes  Elevated toilet seat or a handicapped toilet? No    Cognitive Function:    05/06/2020    1:46 PM 12/28/2017    8:25 AM 12/22/2016    1:51 PM  MMSE -  Mini Mental State Exam  Orientation to time '5 5 5  '$ Orientation to Place '5 5 5  '$ Registration '3 3 3  '$ Attention/ Calculation 5 0 0  Recall '3 3 3  '$ Language- name 2 objects  0 0  Language- repeat '1 1 1  '$ Language- follow 3 step command  3 3  Language- read & follow direction  0 0  Write a sentence  0 0  Copy design  0 0  Total score  20 20        11/16/2022   10:22 AM  6CIT Screen  What Year? 0 points  What month? 0 points  What time? 0 points   Count back from 20 0 points  Months in reverse 0 points  Repeat phrase 0 points  Total Score 0 points    Immunizations Immunization History  Administered Date(s) Administered   Fluad Quad(high Dose 65+) 05/30/2019, 05/15/2020   Influenza Split 06/14/2012   Influenza Whole 06/22/2007, 06/02/2008, 06/10/2009, 06/09/2010   Influenza, High Dose Seasonal PF 06/07/2021, 05/30/2022   Influenza,inj,Quad PF,6+ Mos 05/30/2013, 06/16/2014, 06/19/2015, 06/07/2016, 06/08/2017, 06/14/2018   PFIZER(Purple Top)SARS-COV-2 Vaccination 12/26/2019, 01/21/2020, 09/08/2020   PPD Test 09/13/2013, 11/05/2013   Pneumococcal Conjugate-13 10/31/2014   Pneumococcal Polysaccharide-23 04/19/2011   Td 09/05/2002, 08/08/2012    TDAP status: Due, Education has been provided regarding the importance of this vaccine. Advised may receive this vaccine at local pharmacy or Health Dept. Aware to provide a copy of the vaccination record if obtained from local pharmacy or Health Dept. Verbalized acceptance and understanding.  Flu Vaccine status: Up to date  Pneumococcal vaccine status: Up to date  Covid-19 vaccine status: Information provided on how to obtain vaccines.   Qualifies for Shingles Vaccine? Yes   Zostavax completed Yes   Shingrix Completed?: No.    Education has been provided regarding the importance of this vaccine. Patient has been advised to call insurance company to determine out of pocket expense if they have not yet received this vaccine. Advised may also receive vaccine at local pharmacy or Health Dept. Verbalized acceptance and understanding.  Screening Tests Health Maintenance  Topic Date Due   Zoster Vaccines- Shingrix (1 of 2) Never done   COVID-19 Vaccine (4 - 2023-24 season) 05/06/2022   DTaP/Tdap/Td (3 - Tdap) 08/08/2022   MAMMOGRAM  12/14/2022   Medicare Annual Wellness (AWV)  11/16/2023   Pneumonia Vaccine 33+ Years old  Completed   INFLUENZA VACCINE  Completed   DEXA SCAN  Completed    HPV VACCINES  Aged Out    Health Maintenance  Health Maintenance Due  Topic Date Due   Zoster Vaccines- Shingrix (1 of 2) Never done   COVID-19 Vaccine (4 - 2023-24 season) 05/06/2022   DTaP/Tdap/Td (3 - Tdap) 08/08/2022    Colorectal cancer screening: No longer required.   Mammogram status: Completed 12/13/21. Repeat every year  Bone Density status: Completed 12/13/21. Results reflect: Bone density results: OSTEOPOROSIS. Repeat every 2 years.  Lung Cancer Screening: (Low Dose CT Chest recommended if Age 66-80 years, 30 pack-year currently smoking OR have quit w/in 15years.) does not qualify.   Lung Cancer Screening Referral: no  Additional Screening:  Hepatitis C Screening: does not qualify; Completed no  Vision Screening: Recommended annual ophthalmology exams for early detection of glaucoma and other disorders of the eye. Is the patient up to date with their annual eye exam?  Yes  Who is the provider or what is the name of the office in which the  patient attends annual eye exams? [facility] If pt is not established with a provider, would they like to be referred to a provider to establish care? No .   Dental Screening: Recommended annual dental exams for proper oral hygiene  Community Resource Referral / Chronic Care Management: CRR required this visit?  No   CCM required this visit?  No      Plan:     I have personally reviewed and noted the following in the patient's chart:   Medical and social history Use of alcohol, tobacco or illicit drugs  Current medications and supplements including opioid prescriptions. Patient is not currently taking opioid prescriptions. Functional ability and status Nutritional status Physical activity Advanced directives List of other physicians Hospitalizations, surgeries, and ER visits in previous 12 months Vitals Screenings to include cognitive, depression, and falls Referrals and appointments  In addition, I have  reviewed and discussed with patient certain preventive protocols, quality metrics, and best practice recommendations. A written personalized care plan for preventive services as well as general preventive health recommendations were provided to patient.     Lebron Conners, LPN   [id number]   Nurse Notes: Pt will call for mammogram appointment.

## 2022-11-16 NOTE — Patient Instructions (Signed)
Monica Guerrero , Thank you for taking time to come for your Medicare Wellness Visit. I appreciate your ongoing commitment to your health goals. Please review the following plan we discussed and let me know if I can assist you in the future.   These are the goals we discussed:  Goals      DIET - INCREASE WATER INTAKE     Starting 12/28/2017, I will attempt to drink at least 6-8 glasses of water daily.      Increase physical activity     When schedule permits, I plan to resume going to gym for 60 min at least twice weekly.      Patient Stated     05/06/2020, I will maintain and continue medications as prescribed.     Patient Stated     Lose 20 lbs.        This is a list of the screening recommended for you and due dates:  Health Maintenance  Topic Date Due   Zoster (Shingles) Vaccine (1 of 2) Never done   COVID-19 Vaccine (4 - 2023-24 season) 05/06/2022   DTaP/Tdap/Td vaccine (3 - Tdap) 08/08/2022   Mammogram  12/14/2022   Medicare Annual Wellness Visit  11/16/2023   Pneumonia Vaccine  Completed   Flu Shot  Completed   DEXA scan (bone density measurement)  Completed   HPV Vaccine  Aged Out    Advanced directives: Advance directive discussed with you today. Even though you declined this today, please call our office should you change your mind, and we can give you the proper paperwork for you to fill out.   Conditions/risks identified: Aim for 30 minutes of exercise or brisk walking, 6-8 glasses of water, and 5 servings of fruits and vegetables each day.   Next appointment: Follow up in one year for your annual wellness visit 11/21/2023 @ 10:15 via telephone.   Preventive Care 81 Years and Older, Female Preventive care refers to lifestyle choices and visits with your health care provider that can promote health and wellness. What does preventive care include? A yearly physical exam. This is also called an annual well check. Dental exams once or twice a year. Routine eye exams.  Ask your health care provider how often you should have your eyes checked. Personal lifestyle choices, including: Daily care of your teeth and gums. Regular physical activity. Eating a healthy diet. Avoiding tobacco and drug use. Limiting alcohol use. Practicing safe sex. Taking low-dose aspirin every day. Taking vitamin and mineral supplements as recommended by your health care provider. What happens during an annual well check? The services and screenings done by your health care provider during your annual well check will depend on your age, overall health, lifestyle risk factors, and family history of disease. Counseling  Your health care provider may ask you questions about your: Alcohol use. Tobacco use. Drug use. Emotional well-being. Home and relationship well-being. Sexual activity. Eating habits. History of falls. Memory and ability to understand (cognition). Work and work Statistician. Reproductive health. Screening  You may have the following tests or measurements: Height, weight, and BMI. Blood pressure. Lipid and cholesterol levels. These may be checked every 5 years, or more frequently if you are over 33 years old. Skin check. Lung cancer screening. You may have this screening every year starting at age 81 if you have a 30-pack-year history of smoking and currently smoke or have quit within the past 15 years. Fecal occult blood test (FOBT) of the stool. You may have  this test every year starting at age 81. Flexible sigmoidoscopy or colonoscopy. You may have a sigmoidoscopy every 5 years or a colonoscopy every 10 years starting at age 40. Hepatitis C blood test. Hepatitis B blood test. Sexually transmitted disease (STD) testing. Diabetes screening. This is done by checking your blood sugar (glucose) after you have not eaten for a while (fasting). You may have this done every 1-3 years. Bone density scan. This is done to screen for osteoporosis. You may have this  done starting at age 64. Mammogram. This may be done every 1-2 years. Talk to your health care provider about how often you should have regular mammograms. Talk with your health care provider about your test results, treatment options, and if necessary, the need for more tests. Vaccines  Your health care provider may recommend certain vaccines, such as: Influenza vaccine. This is recommended every year. Tetanus, diphtheria, and acellular pertussis (Tdap, Td) vaccine. You may need a Td booster every 10 years. Zoster vaccine. You may need this after age 81. Pneumococcal 13-valent conjugate (PCV13) vaccine. One dose is recommended after age 81. Pneumococcal polysaccharide (PPSV23) vaccine. One dose is recommended after age 81. Talk to your health care provider about which screenings and vaccines you need and how often you need them. This information is not intended to replace advice given to you by your health care provider. Make sure you discuss any questions you have with your health care provider. Document Released: 09/18/2015 Document Revised: 05/11/2016 Document Reviewed: 06/23/2015 Elsevier Interactive Patient Education  2017 [place] Prevention in the Home Falls can cause injuries. They can happen to people of all ages. There are many things you can do to make your home safe and to help prevent falls. What can I do on the outside of my home? Regularly fix the edges of walkways and driveways and fix any cracks. Remove anything that might make you trip as you walk through a door, such as a raised step or threshold. Trim any bushes or trees on the path to your home. Use bright outdoor lighting. Clear any walking paths of anything that might make someone trip, such as rocks or tools. Regularly check to see if handrails are loose or broken. Make sure that both sides of any steps have handrails. Any raised decks and porches should have guardrails on the edges. Have any leaves,  snow, or ice cleared regularly. Use sand or salt on walking paths during winter. Clean up any spills in your garage right away. This includes oil or grease spills. What can I do in the bathroom? Use night lights. Install grab bars by the toilet and in the tub and shower. Do not use towel bars as grab bars. Use non-skid mats or decals in the tub or shower. If you need to sit down in the shower, use a plastic, non-slip stool. Keep the floor dry. Clean up any water that spills on the floor as soon as it happens. Remove soap buildup in the tub or shower regularly. Attach bath mats securely with double-sided non-slip rug tape. Do not have throw rugs and other things on the floor that can make you trip. What can I do in the bedroom? Use night lights. Make sure that you have a light by your bed that is easy to reach. Do not use any sheets or blankets that are too big for your bed. They should not hang down onto the floor. Have a firm chair that has side arms. You  can use this for support while you get dressed. Do not have throw rugs and other things on the floor that can make you trip. What can I do in the kitchen? Clean up any spills right away. Avoid walking on wet floors. Keep items that you use a lot in easy-to-reach places. If you need to reach something above you, use a strong step stool that has a grab bar. Keep electrical cords out of the way. Do not use floor polish or wax that makes floors slippery. If you must use wax, use non-skid floor wax. Do not have throw rugs and other things on the floor that can make you trip. What can I do with my stairs? Do not leave any items on the stairs. Make sure that there are handrails on both sides of the stairs and use them. Fix handrails that are broken or loose. Make sure that handrails are as long as the stairways. Check any carpeting to make sure that it is firmly attached to the stairs. Fix any carpet that is loose or worn. Avoid having throw  rugs at the top or bottom of the stairs. If you do have throw rugs, attach them to the floor with carpet tape. Make sure that you have a light switch at the top of the stairs and the bottom of the stairs. If you do not have them, ask someone to add them for you. What else can I do to help prevent falls? Wear shoes that: Do not have high heels. Have rubber bottoms. Are comfortable and fit you well. Are closed at the toe. Do not wear sandals. If you use a stepladder: Make sure that it is fully opened. Do not climb a closed stepladder. Make sure that both sides of the stepladder are locked into place. Ask someone to hold it for you, if possible. Clearly mark and make sure that you can see: Any grab bars or handrails. First and last steps. Where the edge of each step is. Use tools that help you move around (mobility aids) if they are needed. These include: Canes. Walkers. Scooters. Crutches. Turn on the lights when you go into a dark area. Replace any light bulbs as soon as they burn out. Set up your furniture so you have a clear path. Avoid moving your furniture around. If any of your floors are uneven, fix them. If there are any pets around you, be aware of where they are. Review your medicines with your doctor. Some medicines can make you feel dizzy. This can increase your chance of falling. Ask your doctor what other things that you can do to help prevent falls. This information is not intended to replace advice given to you by your health care provider. Make sure you discuss any questions you have with your health care provider. Document Released: 06/18/2009 Document Revised: 01/28/2016 Document Reviewed: 09/26/2014 Elsevier Interactive Patient Education  2017 [employer].

## 2022-11-17 ENCOUNTER — Encounter: Payer: Self-pay | Admitting: Family Medicine

## 2022-11-17 ENCOUNTER — Ambulatory Visit (INDEPENDENT_AMBULATORY_CARE_PROVIDER_SITE_OTHER): Payer: Medicare HMO | Admitting: Family Medicine

## 2022-11-17 VITALS — BP 131/80 | HR 53 | Temp 97.7°F | Ht 62.5 in | Wt 173.4 lb

## 2022-11-17 DIAGNOSIS — Z1231 Encounter for screening mammogram for malignant neoplasm of breast: Secondary | ICD-10-CM | POA: Diagnosis not present

## 2022-11-17 DIAGNOSIS — E038 Other specified hypothyroidism: Secondary | ICD-10-CM

## 2022-11-17 DIAGNOSIS — E559 Vitamin D deficiency, unspecified: Secondary | ICD-10-CM

## 2022-11-17 DIAGNOSIS — I10 Essential (primary) hypertension: Secondary | ICD-10-CM

## 2022-11-17 DIAGNOSIS — I480 Paroxysmal atrial fibrillation: Secondary | ICD-10-CM

## 2022-11-17 DIAGNOSIS — Z0000 Encounter for general adult medical examination without abnormal findings: Secondary | ICD-10-CM

## 2022-11-17 DIAGNOSIS — E669 Obesity, unspecified: Secondary | ICD-10-CM

## 2022-11-17 DIAGNOSIS — Z1211 Encounter for screening for malignant neoplasm of colon: Secondary | ICD-10-CM

## 2022-11-17 DIAGNOSIS — E7800 Pure hypercholesterolemia, unspecified: Secondary | ICD-10-CM

## 2022-11-17 DIAGNOSIS — M810 Age-related osteoporosis without current pathological fracture: Secondary | ICD-10-CM

## 2022-11-17 MED ORDER — HYDROCHLOROTHIAZIDE 25 MG PO TABS: 25.0000 mg | ORAL_TABLET | Freq: Every day | ORAL | 3 refills | Status: DC

## 2022-11-17 NOTE — Assessment & Plan Note (Signed)
Disc goals for lipids and reasons to control them Rev last labs with pt Rev low sat fat diet in detail   LDL down to 157  Declines medication  Wants to keep working on diet

## 2022-11-17 NOTE — Assessment & Plan Note (Signed)
Mammogram ordered Pt will call to schedule No changes on exam

## 2022-11-17 NOTE — Assessment & Plan Note (Signed)
Discussed how this problem influences overall health and the risks it imposes  Reviewed plan for weight loss with lower calorie diet (via better food choices and also portion control or program like weight watchers) and exercise building up to or more than 30 minutes 5 days per week including some aerobic activity   Enc to add some strength training

## 2022-11-17 NOTE — Assessment & Plan Note (Signed)
Under care of cardiology °Stable rate °Continue eliquis and ziac °

## 2022-11-17 NOTE — Assessment & Plan Note (Addendum)
Cologuard nl 06/2022

## 2022-11-17 NOTE — Assessment & Plan Note (Signed)
Reviewed health habits including diet and exercise and skin cancer prevention Reviewed appropriate screening tests for age  Also reviewed health mt list, fam hx and immunization status , as well as social and family history   See HPI Labs reviewed  Will get tetanus shot at pharm or health dept Will schedule mammogram / this was ordered Dexa utd    no falls or fx Cologaurd neg 06/2022

## 2022-11-17 NOTE — Assessment & Plan Note (Signed)
Vitamin D level is therapeutic with current supplementation Disc importance of this to bone and overall health In setting of OP

## 2022-11-17 NOTE — Assessment & Plan Note (Signed)
Nl tsh this visit  Lab Results  Component Value Date   TSH 3.05 11/10/2022   No clinical changes FT4 in nl range

## 2022-11-17 NOTE — Assessment & Plan Note (Signed)
Dexa rev 12/2021 No falls or fx Taking ca and D and D level is tx  Enc more strength building exercise

## 2022-11-17 NOTE — Patient Instructions (Addendum)
Consider a tetanus shot in the pharmacy or health dept   Call Monica Guerrero to schedule your mammogram- due next month    In addition to walking , any strength training is good for bones and brain    For cholesterol Avoid red meat/ fried foods/ egg yolks/ fatty breakfast meats/ butter, cheese and high fat dairy/ and shellfish

## 2022-11-17 NOTE — Assessment & Plan Note (Addendum)
bp in fair control at this time  BP Readings from Last 1 Encounters:  11/17/22 131/80   No changes needed Most recent labs reviewed  Disc lifstyle change with low sodium diet and exercise  Plan to continue  Hctz 25 mg daily  Verapamil SR 120 mg daily Ziac 2.5-6.25 mg daily   K is 3.4 Not unusual for her  She will try and eat high K foods

## 2022-11-17 NOTE — Progress Notes (Signed)
Subjective:    Patient ID: Monica Guerrero, female    DOB: [birth year], 82 y.o.   MRN: [id number]  HPI Here for health maintenance exam and to review chronic medical problems    Wt Readings from Last 3 Encounters:  11/17/22 173 lb 6 oz (78.6 kg)  11/16/22 175 lb (79.4 kg)  11/19/21 176 lb (79.8 kg)   31.21 kg/m  Vitals:   11/17/22 1038  BP: (!) 142/82  Pulse: (!) 53  Temp: 97.7 F (36.5 C)  SpO2: 98%   Keeping great grand kids 24,16,45 years old - usually one at a time   Trying to care for herself   Working in the yard   Immunization History  Administered Date(s) Administered   Fluad Quad(high Dose 65+) 05/30/2019, 05/15/2020   Influenza Split 06/14/2012   Influenza Whole 06/22/2007, 06/02/2008, 06/10/2009, 06/09/2010   Influenza, High Dose Seasonal PF 06/07/2021, 05/30/2022   Influenza,inj,Quad PF,6+ Mos 05/30/2013, 06/16/2014, 06/19/2015, 06/07/2016, 06/08/2017, 06/14/2018   PFIZER(Purple Top)SARS-COV-2 Vaccination 12/26/2019, 01/21/2020, 09/08/2020   PPD Test 09/13/2013, 11/05/2013   Pneumococcal Conjugate-13 10/31/2014   Pneumococcal Polysaccharide-23 04/19/2011   Td 09/05/2002, 08/08/2012   Health Maintenance Due  Topic Date Due   DTaP/Tdap/Td (3 - Tdap) 08/08/2022   Tetanus shot : needs   Mammogram 12/2021- due for  Self breast exam-no lumps   Dexa  12/2021  OP Falls: none Fractures-none  Supplements  ca plus D D level 42.2  Exercise  - is busy  Trying to start walking more-using a new smart watch and increasing steps per day   Cologuard- oct 2023 was normal   HTN bp is stable today  No cp or palpitations or headaches or edema  No side effects to medicines  BP Readings from Last 3 Encounters:  11/17/22 131/80  11/19/21 120/70  10/26/21 132/76    Hctz 25 mg daily  Verapamil SR 120 mg daily Ziac 2.5-6.25 mg daily   Lab Results  Component Value Date   CREATININE 0.82 11/10/2022   BUN 18 11/10/2022   NA 142 11/10/2022   K 3.4 (L) 11/10/2022    CL 100 11/10/2022   CO2 33 (H) 11/10/2022   K stays a little low   Subclinical hypothyroid Lab Results  Component Value Date   TSH 3.05 11/10/2022   Hyperlipidemia Lab Results  Component Value Date   CHOL 244 (H) 11/10/2022   CHOL 260 (H) 10/18/2021   CHOL 219 (H) 05/07/2020   Lab Results  Component Value Date   HDL 63.00 11/10/2022   HDL 60.00 10/18/2021   HDL 50.10 05/07/2020   Lab Results  Component Value Date   LDLCALC 157 (H) 11/10/2022   LDLCALC 172 (H) 10/18/2021   LDLCALC 142 (H) 05/07/2020   Lab Results  Component Value Date   TRIG 119.0 11/10/2022   TRIG 141.0 10/18/2021   TRIG 134.0 05/07/2020   Lab Results  Component Value Date   CHOLHDL 4 11/10/2022   CHOLHDL 4 10/18/2021   CHOLHDL 4 05/07/2020   Lab Results  Component Value Date   LDLDIRECT 175.7 09/09/2013   LDLDIRECT 184.5 08/01/2012   LDLDIRECT 169.6 10/20/2011   Declines medication   Watching what she eats  Patient Active Problem List   Diagnosis Date Noted   Colon cancer screening 10/26/2021   Paroxysmal atrial fibrillation ([place]) 05/15/2020   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   Obesity (BMI 30-39.9) 12/27/2016   Routine general medical examination at a health care facility 12/08/2015   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   Estrogen deficiency 10/31/2014   Vitamin D deficiency 10/31/2014   Encounter for Medicare annual wellness exam 09/13/2013   Screening mammogram, encounter for 04/19/2011   IRRITABLE BOWEL SYNDROME 01/22/2010   HYPERCHOLESTEROLEMIA 10/03/2007   GLAUCOMA 10/03/2007   Essential hypertension 10/03/2007   ALLERGIC RHINITIS 10/03/2007   OSTEOARTHRITIS 10/03/2007   Osteoporosis 10/03/2007   Past Medical History:  Diagnosis Date   Allergy    Arthritis    Osteoarthritis   Basal cell carcinoma 12/06/2016   L ant lat neck anteriro   Basal cell carcinoma 10/20/2016   L ant lat neck posterior   Cancer (HCC)    skin   Hypertension    Osteoporosis     Squamous cell carcinoma of skin 11/23/2017   L neck lat near angle of mandible (SCCIS)   Past Surgical History:  Procedure Laterality Date   BASAL CELL CARCINOMA EXCISION  12/2016   neck   EYE SURGERY     Bilateral cataract Surgery   SQUAMOUS CELL CARCINOMA EXCISION  12/2016   neck   TONSILLECTOMY     TOOTH EXTRACTION     Social History   Tobacco Use   Smoking status: Never   Smokeless tobacco: Never  Vaping Use   Vaping Use: Never used  Substance Use Topics   Alcohol use: No    Alcohol/week: 0.0 standard drinks of alcohol   Drug use: No   Family History  Problem Relation Age of Onset   Cancer Father        Kidney   Heart disease Father    Cancer Paternal Grandmother    Dementia Mother    Hypertension Brother    Heart disease Maternal Uncle    Stroke Maternal Grandmother    Heart disease Maternal Grandfather    Breast cancer Neg Hx    No Known Allergies Current Outpatient Medications on File Prior to Visit  Medication Sig Dispense Refill   acetaminophen (TYLENOL) 650 MG CR tablet Take 650 mg by mouth every 8 (eight) hours as needed. Alternates with Ibuprofen.     apixaban (ELIQUIS) 5 MG TABS tablet Take 1 tablet (5 mg total) by mouth 2 (two) times daily. 180 tablet 1   bisoprolol-hydrochlorothiazide (ZIAC) 2.5-6.25 MG tablet TAKE 1 TABLET EVERY DAY 90 tablet 1   calcium carbonate (OS-CAL) 600 MG TABS Take 600 mg by mouth daily.     chlorpheniramine (CHLOR-TRIMETON) 4 MG tablet Take 4 mg by mouth 2 (two) times daily as needed.     cholecalciferol (VITAMIN D) 1000 UNITS tablet Take 2,000 Units by mouth daily.     GARLIC PO Take 1 capsule by mouth daily.     ketoconazole (NIZORAL) 2 % cream Apply to feet qhs for fungal infection. 60 g 11   levocetirizine (XYZAL) 5 MG tablet Take 5 mg by mouth daily.     Lifitegrast 5 % SOLN Place 1 drop into both eyes 2 (two) times a day.     Multiple Vitamins-Minerals (PRESERVISION AREDS 2) CAPS Take 1 capsule by mouth 2 (two) times  a day.     phenylephrine (NEO-SYNEPHRINE) 0.125 % nasal drops Place 1 drop into the nose as needed.     Polyethyl Glycol-Propyl Glycol (SYSTANE OP) Apply to eye 2 (two) times daily as needed.     Tavaborole 5 % SOLN Apply to the toenails QHS. 10 mL 11   timolol (BETIMOL) 0.5 % ophthalmic solution Place 1 drop into both eyes daily.  verapamil (CALAN-SR) 120 MG CR tablet Take 1 tablet (120 mg total) by mouth at bedtime. 90 tablet 2   No current facility-administered medications on file prior to visit.     Review of Systems  Constitutional:  Negative for activity change, appetite change, fatigue, fever and unexpected weight change.  HENT:  Negative for congestion, ear pain, rhinorrhea, sinus pressure and sore throat.   Eyes:  Negative for pain, redness and visual disturbance.  Respiratory:  Negative for cough, shortness of breath and wheezing.   Cardiovascular:  Negative for chest pain and palpitations.  Gastrointestinal:  Negative for abdominal pain, blood in stool, constipation and diarrhea.  Endocrine: Negative for polydipsia and polyuria.  Genitourinary:  Negative for dysuria, frequency and urgency.  Musculoskeletal:  Positive for arthralgias. Negative for back pain and myalgias.  Skin:  Negative for pallor and rash.  Allergic/Immunologic: Negative for environmental allergies.  Neurological:  Negative for dizziness, syncope and headaches.  Hematological:  Negative for adenopathy. Does not bruise/bleed easily.  Psychiatric/Behavioral:  Negative for decreased concentration and dysphoric mood. The patient is not nervous/anxious.        Objective:   Physical Exam Constitutional:      General: She is not in acute distress.    Appearance: Normal appearance. She is well-developed. She is obese. She is not ill-appearing or diaphoretic.  HENT:     Head: Normocephalic and atraumatic.     Right Ear: Tympanic membrane, ear canal and external ear normal.     Left Ear: Tympanic membrane,  ear canal and external ear normal.     Nose: Nose normal. No congestion.     Mouth/Throat:     Mouth: Mucous membranes are moist.     Pharynx: Oropharynx is clear. No posterior oropharyngeal erythema.  Eyes:     General: No scleral icterus.    Extraocular Movements: Extraocular movements intact.     Conjunctiva/sclera: Conjunctivae normal.     Pupils: Pupils are equal, round, and reactive to light.  Neck:     Thyroid: No thyromegaly.     Vascular: No carotid bruit or JVD.  Cardiovascular:     Rate and Rhythm: Normal rate and regular rhythm.     Pulses: Normal pulses.     Heart sounds: Normal heart sounds.     No gallop.  Pulmonary:     Effort: Pulmonary effort is normal. No respiratory distress.     Breath sounds: Normal breath sounds. No wheezing.     Comments: Good air exch Chest:     Chest wall: No tenderness.  Abdominal:     General: Bowel sounds are normal. There is no distension or abdominal bruit.     Palpations: Abdomen is soft. There is no mass.     Tenderness: There is no abdominal tenderness.     Hernia: No hernia is present.  Genitourinary:    Comments: Breast exam: No mass, nodules, thickening, tenderness, bulging, retraction, inflamation, nipple discharge or skin changes noted.  No axillary or clavicular LA.     Musculoskeletal:        General: No tenderness. Normal range of motion.     Cervical back: Normal range of motion and neck supple. No rigidity. No muscular tenderness.     Right lower leg: No edema.     Left lower leg: No edema.     Comments: No kyphosis   Lymphadenopathy:     Cervical: No cervical adenopathy.  Skin:    General: Skin is warm and dry.  Coloration: Skin is not pale.     Findings: No erythema or rash.     Comments: Solar lentigines diffusely Some sks  Neurological:     Mental Status: She is alert. Mental status is at baseline.     Cranial Nerves: No cranial nerve deficit.     Motor: No abnormal muscle tone.     Coordination:  Coordination normal.     Gait: Gait normal.     Deep Tendon Reflexes: Reflexes are normal and symmetric. Reflexes normal.  Psychiatric:        Mood and Affect: Mood normal.        Cognition and Memory: Cognition and memory normal.           Assessment & Plan:   Problem List Items Addressed This Visit       Cardiovascular and Mediastinum   Essential hypertension    bp in fair control at this time  BP Readings from Last 1 Encounters:  11/17/22 131/80  No changes needed Most recent labs reviewed  Disc lifstyle change with low sodium diet and exercise  Plan to continue  Hctz 25 mg daily  Verapamil SR 120 mg daily Ziac 2.5-6.25 mg daily   K is 3.4 Not unusual for her  She will try and eat high K foods      Relevant Medications   hydrochlorothiazide (HYDRODIURIL) 25 MG tablet   Paroxysmal atrial fibrillation (HCC)    Under care of cardiology Stable rate Continue eliquis and ziac      Relevant Medications   hydrochlorothiazide (HYDRODIURIL) 25 MG tablet     Endocrine   Subclinical hypothyroidism    Nl tsh this visit  Lab Results  Component Value Date   TSH 3.05 11/10/2022  No clinical changes FT4 in nl range        Musculoskeletal and Integument   Osteoporosis    Dexa rev 12/2021 No falls or fx Taking ca and D and D level is tx  Enc more strength building exercise          Other   Colon cancer screening    Cologuard nl 06/2022       HYPERCHOLESTEROLEMIA    Disc goals for lipids and reasons to control them Rev last labs with pt Rev low sat fat diet in detail   LDL down to 157  Declines medication  Wants to keep working on diet       Relevant Medications   hydrochlorothiazide (HYDRODIURIL) 25 MG tablet   Obesity (BMI 30-39.9)    Discussed how this problem influences overall health and the risks it imposes  Reviewed plan for weight loss with lower calorie diet (via better food choices and also portion control or program like weight  watchers) and exercise building up to or more than 30 minutes 5 days per week including some aerobic activity   Enc to add some strength training       Routine general medical examination at a health care facility - Primary    Reviewed health habits including diet and exercise and skin cancer prevention Reviewed appropriate screening tests for age  Also reviewed health mt list, fam hx and immunization status , as well as social and family history   See HPI Labs reviewed  Will get tetanus shot at pharm or health dept Will schedule mammogram / this was ordered Dexa utd    no falls or fx Cologaurd neg 06/2022        Screening mammogram, encounter  for    Mammogram ordered Pt will call to schedule No changes on exam      Relevant Orders   MM 3D SCREENING MAMMOGRAM BILATERAL BREAST   Vitamin D deficiency    Vitamin D level is therapeutic with current supplementation Disc importance of this to bone and overall health In setting of OP

## 2022-12-14 ENCOUNTER — Encounter: Payer: Self-pay | Admitting: Dermatology

## 2022-12-14 ENCOUNTER — Ambulatory Visit: Payer: Medicare HMO | Admitting: Dermatology

## 2022-12-14 VITALS — BP 123/71

## 2022-12-14 DIAGNOSIS — L820 Inflamed seborrheic keratosis: Secondary | ICD-10-CM

## 2022-12-14 DIAGNOSIS — Z86007 Personal history of in-situ neoplasm of skin: Secondary | ICD-10-CM

## 2022-12-14 DIAGNOSIS — Z85828 Personal history of other malignant neoplasm of skin: Secondary | ICD-10-CM | POA: Diagnosis not present

## 2022-12-14 DIAGNOSIS — Z8589 Personal history of malignant neoplasm of other organs and systems: Secondary | ICD-10-CM

## 2022-12-14 DIAGNOSIS — L570 Actinic keratosis: Secondary | ICD-10-CM

## 2022-12-14 DIAGNOSIS — L814 Other melanin hyperpigmentation: Secondary | ICD-10-CM | POA: Diagnosis not present

## 2022-12-14 DIAGNOSIS — B353 Tinea pedis: Secondary | ICD-10-CM

## 2022-12-14 DIAGNOSIS — Z1283 Encounter for screening for malignant neoplasm of skin: Secondary | ICD-10-CM | POA: Diagnosis not present

## 2022-12-14 DIAGNOSIS — L821 Other seborrheic keratosis: Secondary | ICD-10-CM | POA: Diagnosis not present

## 2022-12-14 DIAGNOSIS — D1801 Hemangioma of skin and subcutaneous tissue: Secondary | ICD-10-CM

## 2022-12-14 DIAGNOSIS — L578 Other skin changes due to chronic exposure to nonionizing radiation: Secondary | ICD-10-CM

## 2022-12-14 DIAGNOSIS — Z79899 Other long term (current) drug therapy: Secondary | ICD-10-CM

## 2022-12-14 DIAGNOSIS — L918 Other hypertrophic disorders of the skin: Secondary | ICD-10-CM

## 2022-12-14 DIAGNOSIS — D229 Melanocytic nevi, unspecified: Secondary | ICD-10-CM

## 2022-12-14 MED ORDER — TERBINAFINE HCL 250 MG PO TABS: 250.0000 mg | ORAL_TABLET | Freq: Every day | ORAL | 0 refills | Status: DC

## 2022-12-14 NOTE — Progress Notes (Signed)
Follow-Up Visit   Subjective  Monica Guerrero is a 82 y.o. female who presents for the following: Skin Cancer Screening and Full Body Skin Exam, hx of BCCs, SCC IS, Aks, check spot R dorsum hand, not sure how long it has been there, painful, scaly spot R cheek  The patient presents for Total-Body Skin Exam (TBSE) for skin cancer screening and mole check. The patient has spots, moles and lesions to be evaluated, some may be new or changing and the patient has concerns that these could be cancer.    The following portions of the chart were reviewed this encounter and updated as appropriate: medications, allergies, medical history  Review of Systems:  No other skin or systemic complaints except as noted in HPI or Assessment and Plan.  Objective  Well appearing patient in no apparent distress; mood and affect are within normal limits.  A full examination was performed including scalp, head, eyes, ears, nose, lips, neck, chest, axillae, abdomen, back, buttocks, bilateral upper extremities, bilateral lower extremities, hands, feet, fingers, toes, fingernails, and toenails. All findings within normal limits unless otherwise noted below.   Relevant physical exam findings are noted in the Assessment and Plan.    Assessment & Plan   LENTIGINES, SEBORRHEIC KERATOSES, HEMANGIOMAS - Benign normal skin lesions - Benign-appearing - Call for any changes  MELANOCYTIC NEVI - Tan-brown and/or pink-flesh-colored symmetric macules and papules - Benign appearing on exam today - Observation - Call clinic for new or changing moles - Recommend daily use of broad spectrum spf 30+ sunscreen to sun-exposed areas.   ACTINIC DAMAGE - Chronic condition, secondary to cumulative UV/sun exposure - diffuse scaly erythematous macules with underlying dyspigmentation - Recommend daily broad spectrum sunscreen SPF 30+ to sun-exposed areas, reapply every 2 hours as needed.  - Staying in the shade or wearing long  sleeves, sun glasses (UVA+UVB protection) and wide brim hats (4-inch brim around the entire circumference of the hat) are also recommended for sun protection.  - Call for new or changing lesions.  SKIN CANCER SCREENING PERFORMED TODAY.  HISTORY OF BASAL CELL CARCINOMA OF THE SKIN - No evidence of recurrence today - Recommend regular full body skin exams - Recommend daily broad spectrum sunscreen SPF 30+ to sun-exposed areas, reapply every 2 hours as needed.  - Call if any new or changing lesions are noted between office visits  - L ant lat neck anterior, L ant lat neck posterior   HISTORY OF SQUAMOUS CELL CARCINOMA IN SITU OF THE SKIN - No evidence of recurrence today - Recommend regular full body skin exams - Recommend daily broad spectrum sunscreen SPF 30+ to sun-exposed areas, reapply every 2 hours as needed.  - Call if any new or changing lesions are noted between office visits  - L neck lat near angle of mandible  INFLAMED SEBORRHEIC KERATOSIS Exam: Erythematous keratotic or waxy stuck-on papule or plaque.  Symptomatic, irritating, patient would like treated.  Benign-appearing.  Call clinic for new or changing lesions.   Prior to procedure, discussed risks of blister formation, small wound, skin dyspigmentation, or rare scar following treatment. Recommend Vaseline ointment to treated areas while healing.  Destruction Procedure Note Destruction method: cryotherapy   Informed consent: discussed and consent obtained   Lesion destroyed using liquid nitrogen: Yes   Outcome: patient tolerated procedure well with no complications   Post-procedure details: wound care instructions given   Locations: R dorsum hand x 1 # of Lesions Treated: 1   ACTINIC KERATOSIS  Exam: Erythematous thin papules/macules with gritty scale  Actinic keratoses are precancerous spots that appear secondary to cumulative UV radiation exposure/sun exposure over time. They are chronic with expected duration  over 1 year. A portion of actinic keratoses will progress to squamous cell carcinoma of the skin. It is not possible to reliably predict which spots will progress to skin cancer and so treatment is recommended to prevent development of skin cancer.  Recommend daily broad spectrum sunscreen SPF 30+ to sun-exposed areas, reapply every 2 hours as needed.  Recommend staying in the shade or wearing long sleeves, sun glasses (UVA+UVB protection) and wide brim hats (4-inch brim around the entire circumference of the hat). Call for new or changing lesions.  Treatment Plan:  Prior to procedure, discussed risks of blister formation, small wound, skin dyspigmentation, or rare scar following cryotherapy. Recommend Vaseline ointment to treated areas while healing.  Destruction Procedure Note Destruction method: cryotherapy   Informed consent: discussed and consent obtained   Lesion destroyed using liquid nitrogen: Yes   Outcome: patient tolerated procedure well with no complications   Post-procedure details: wound care instructions given   Locations: hands, nose, R cheek # of Lesions Treated: 14   Acrochordons (Skin Tags) - Fleshy, skin-colored pedunculated papules - Benign appearing.  - Observe. - If desired, they can be removed with an in office procedure that is not covered by insurance. - Please call the clinic if you notice any new or changing lesions.  - neck  TINEA UNGUIUM AND TINEA PEDIS Exam: scaliness R foot, toenail dystrophy R great toenail L foot clear  Chronic and persistent condition with duration or expected duration over one year. Condition is symptomatic/ bothersome to patient. Not currently at goal.  Treatment Plan: Labs from 11/10/22 Start Lamisil 250mg  1 po qd x 49m   Terbinafine Counseling  Terbinafine is an anti-fungal medicine that can be applied to the skin (over the counter) or taken by mouth (prescription) to treat fungal infections. The pill version is often used  to treat fungal infections of the nails or scalp. While most people do not have any side effects from taking terbinafine pills, some possible side effects of the medicine can include taste changes, headache, loss of smell, vision changes, nausea, vomiting, or diarrhea.   Rare side effects can include irritation of the liver, allergic reaction, or decrease in blood counts (which may show up as not feeling well or developing an infection). If you are concerned about any of these side effects, please stop the medicine and call your doctor, or in the case of an emergency such as feeling very unwell, seek immediate medical care.    Return in about 3 months (around 03/15/2023) for AK f/u, Tinea unguium f/u.  I, Ardis Rowan, RMA, am acting as scribe for Armida Sans, MD .   Documentation: I have reviewed the above documentation for accuracy and completeness, and I agree with the above.  Armida Sans, MD

## 2022-12-14 NOTE — Patient Instructions (Addendum)
Cryotherapy Aftercare  Wash gently with soap and water everyday.   Apply Vaseline and Band-Aid daily until healed.     Due to recent changes in healthcare laws, you may see results of your pathology and/or laboratory studies on MyChart before the doctors have had a chance to review them. We understand that in some cases there may be results that are confusing or concerning to you. Please understand that not all results are received at the same time and often the doctors may need to interpret multiple results in order to provide you with the best plan of care or course of treatment. Therefore, we ask that you please give us 2 business days to thoroughly review all your results before contacting the office for clarification. Should we see a critical lab result, you will be contacted sooner.   If You Need Anything After Your Visit  If you have any questions or concerns for your doctor, please call our main line at [phone] and press option 4 to reach your doctor's medical assistant. If no one answers, please leave a voicemail as directed and we will return your call as soon as possible. Messages left after 4 pm will be answered the following business day.   You may also send us a message via MyChart. We typically respond to MyChart messages within 1-2 business days.  For prescription refills, please ask your pharmacy to contact our office. Our fax number is [phone].  If you have an urgent issue when the clinic is closed that cannot wait until the next business day, you can page your doctor at the number below.    Please note that while we do our best to be available for urgent issues outside of office hours, we are not available 24/7.   If you have an urgent issue and are unable to reach us, you may choose to seek medical care at your doctor's office, retail clinic, urgent care center, or emergency room.  If you have a medical emergency, please immediately call 911 or go to the  emergency department.  Pager Numbers  - Dr. Kowalski: [phone]  - Dr. Moye: [phone]  - Dr. Stewart: [phone]  In the event of inclement weather, please call our main line at [phone] for an update on the status of any delays or closures.  Dermatology Medication Tips: Please keep the boxes that topical medications come in in order to help keep track of the instructions about where and how to use these. Pharmacies typically print the medication instructions only on the boxes and not directly on the medication tubes.   If your medication is too expensive, please contact our office at [phone] option 4 or send us a message through MyChart.   We are unable to tell what your co-pay for medications will be in advance as this is different depending on your insurance coverage. However, we may be able to find a substitute medication at lower cost or fill out paperwork to get insurance to cover a needed medication.   If a prior authorization is required to get your medication covered by your insurance company, please allow us 1-2 business days to complete this process.  Drug prices often vary depending on where the prescription is filled and some pharmacies may offer cheaper prices.  The website www.goodrx.com contains coupons for medications through different pharmacies. The prices here do not account for what the cost may be with help from insurance (it may be cheaper with your insurance), but the website can   give you the price if you did not use any insurance.  - You can print the associated coupon and take it with your prescription to the pharmacy.  - You may also stop by our office during regular business hours and pick up a GoodRx coupon card.  - If you need your prescription sent electronically to a different pharmacy, notify our office through [facility] MyChart or by phone at [phone] option 4.     Si Usted Necesita Algo Despus de Su Visita  Tambin puede  enviarnos un mensaje a travs de MyChart. Por lo general respondemos a los mensajes de MyChart en el transcurso de 1 a 2 das hbiles.  Para renovar recetas, por favor pida a su farmacia que se ponga en contacto con nuestra oficina. Nuestro nmero de fax es el [phone].  Si tiene un asunto urgente cuando la clnica est cerrada y que no puede esperar hasta el siguiente da hbil, puede llamar/localizar a su doctor(a) al nmero que aparece a continuacin.   Por favor, tenga en cuenta que aunque hacemos todo lo posible para estar disponibles para asuntos urgentes fuera del horario de oficina, no estamos disponibles las 24 horas del da, los 7 das de la semana.   Si tiene un problema urgente y no puede comunicarse con nosotros, puede optar por buscar atencin mdica  en el consultorio de su doctor(a), en una clnica privada, en un centro de atencin urgente o en una sala de emergencias.  Si tiene una emergencia mdica, por favor llame inmediatamente al 911 o vaya a la sala de emergencias.  Nmeros de bper  - Dr. Kowalski: [phone]  - Dra. Moye: [phone]  - Dra. Stewart: [phone]  En caso de inclemencias del tiempo, por favor llame a nuestra lnea principal al [phone] para una actualizacin sobre el estado de cualquier retraso o cierre.  Consejos para la medicacin en dermatologa: Por favor, guarde las cajas en las que vienen los medicamentos de uso tpico para ayudarle a seguir las instrucciones sobre dnde y cmo usarlos. Las farmacias generalmente imprimen las instrucciones del medicamento slo en las cajas y no directamente en los tubos del medicamento.   Si su medicamento es muy caro, por favor, pngase en contacto con nuestra oficina llamando al [phone] y presione la opcin 4 o envenos un mensaje a travs de MyChart.   No podemos decirle cul ser su copago por los medicamentos por adelantado ya que esto es diferente dependiendo de la cobertura de su seguro.  Sin embargo, es posible que podamos encontrar un medicamento sustituto a menor costo o llenar un formulario para que el seguro cubra el medicamento que se considera necesario.   Si se requiere una autorizacin previa para que su compaa de seguros cubra su medicamento, por favor permtanos de 1 a 2 das hbiles para completar este proceso.  Los precios de los medicamentos varan con frecuencia dependiendo del lugar de dnde se surte la receta y alguna farmacias pueden ofrecer precios ms baratos.  El sitio web www.goodrx.com tiene cupones para medicamentos de diferentes farmacias. Los precios aqu no tienen en cuenta lo que podra costar con la ayuda del seguro (puede ser ms barato con su seguro), pero el sitio web puede darle el precio si no utiliz ningn seguro.  - Puede imprimir el cupn correspondiente y llevarlo con su receta a la farmacia.  - Tambin puede pasar por nuestra oficina durante el horario de atencin regular y recoger una tarjeta de cupones de GoodRx.  -   Si necesita que su receta se enve electrnicamente a una farmacia diferente, informe a nuestra oficina a travs de MyChart de [place] o por telfono llamando al [phone] y presione la opcin 4.  

## 2022-12-15 ENCOUNTER — Telehealth: Payer: Self-pay

## 2022-12-15 NOTE — Telephone Encounter (Signed)
Patient left nurse VM asking if you would like for her to continue her Ketoconazole or Kerydin along with the Terbinafine?

## 2022-12-19 NOTE — Telephone Encounter (Signed)
Patient advised of information per Dr. Kowalski. aw 

## 2022-12-30 ENCOUNTER — Encounter: Payer: Self-pay | Admitting: Dermatology

## 2023-01-05 ENCOUNTER — Telehealth: Payer: Self-pay | Admitting: *Deleted

## 2023-01-05 DIAGNOSIS — I480 Paroxysmal atrial fibrillation: Secondary | ICD-10-CM

## 2023-01-05 NOTE — Telephone Encounter (Signed)
Prescription refill request for Eliquis received. Indication: afib  Last office visit: agbor etang, 11/19/2021 Scr: 0.82, 11/10/2022 Age: 82 yo  Weight: 78.6 kg   Pt would like a 90 day supply sent to [facility]. Msg sent to  scheduling to make an appointment.

## 2023-01-06 MED ORDER — APIXABAN 5 MG PO TABS: 5.0000 mg | ORAL_TABLET | Freq: Two times a day (BID) | ORAL | 1 refills | Status: DC

## 2023-01-06 NOTE — Telephone Encounter (Signed)
Pt has scheduled appt with Dr Azucena Cecil on 02/22/23.   Appropriate dose. Refill sent.

## 2023-01-25 ENCOUNTER — Ambulatory Visit
Admission: RE | Admit: 2023-01-25 | Discharge: 2023-01-25 | Disposition: A | Discharge status: Home / Self Care | Payer: Medicare HMO | Source: Ambulatory Visit | Attending: Family Medicine | Admitting: Family Medicine

## 2023-01-25 DIAGNOSIS — Z1231 Encounter for screening mammogram for malignant neoplasm of breast: Secondary | ICD-10-CM | POA: Diagnosis not present

## 2023-02-07 ENCOUNTER — Other Ambulatory Visit: Payer: Self-pay

## 2023-02-07 MED ORDER — VERAPAMIL HCL ER 120 MG PO TBCR: 120.0000 mg | EXTENDED_RELEASE_TABLET | Freq: Every day | ORAL | 0 refills | Status: DC

## 2023-02-22 ENCOUNTER — Encounter: Payer: Self-pay | Admitting: Cardiology

## 2023-02-22 ENCOUNTER — Ambulatory Visit: Payer: Medicare HMO | Attending: Cardiology | Admitting: Cardiology

## 2023-02-22 VITALS — BP 142/82 | HR 60 | Ht 63.0 in | Wt 176.4 lb

## 2023-02-22 DIAGNOSIS — I10 Essential (primary) hypertension: Secondary | ICD-10-CM

## 2023-02-22 DIAGNOSIS — I480 Paroxysmal atrial fibrillation: Secondary | ICD-10-CM | POA: Diagnosis not present

## 2023-02-22 NOTE — Patient Instructions (Signed)
Medication Instructions:   Your physician recommends that you continue on your current medications as directed. Please refer to the Current Medication list given to you today.  *If you need a refill on your cardiac medications before your next appointment, please call your pharmacy*   Lab Work:  None Ordered  If you have labs (blood work) drawn today and your tests are completely normal, you will receive your results only by: MyChart Message (if you have MyChart) OR A paper copy in the mail If you have any lab test that is abnormal or we need to change your treatment, we will call you to review the results.   Testing/Procedures:  None Ordered    Follow-Up: At [facility], you and your health needs are our priority.  As part of our continuing mission to provide you with exceptional heart care, we have created designated Provider Care Teams.  These Care Teams include your primary Cardiologist (physician) and Advanced Practice Providers (APPs -  Physician Assistants and Nurse Practitioners) who all work together to provide you with the care you need, when you need it.  We recommend signing up for the patient portal called "MyChart".  Sign up information is provided on this After Visit Summary.  MyChart is used to connect with patients for Virtual Visits (Telemedicine).  Patients are able to view lab/test results, encounter notes, upcoming appointments, etc.  Non-urgent messages can be sent to your provider as well.   To learn more about what you can do with MyChart, go to https://www.mychart.com.    Your next appointment:   12 month(s)  Provider:   You may see Brian Agbor-Etang, MD or one of the following Advanced Practice Providers on your designated Care Team:   Christopher Berge, NP Ryan Dunn, PA-C Cadence Furth, PA-C Sheri Hammock, NP  

## 2023-02-22 NOTE — Progress Notes (Signed)
Cardiology Office Note:    Date:  02/22/2023   ID:  Monica Guerrero, DOB Aug 06, 1941, MRN [id number]  PCP:  Judy Pimple, MD  Cardiologist:  Debbe Odea, MD  Electrophysiologist:  None   Referring MD: Judy Pimple, MD   Chief Complaint  Patient presents with   Follow-up    Patient denies new or acute cardiac problems/concerns today.      History of Present Illness:    Monica Guerrero is a 82 y.o. female with a hx of paroxysmal atrial fibrillation, hyperlipidemia, hypertension, who presents for follow-up.  Presents for annual visit, states feeling well, no significant palpitations, no bleeding issues with Eliquis.  Blood pressure usually controlled.  Compliant with medications as prescribed.  No concerns today.  Prior notes Echocardiogram 10/2019 EF 60 to 65% Cardiac monitor 09/2019 SVT and atrial fibrillation noted  Past Medical History:  Diagnosis Date   Actinic keratosis    Allergy    Arthritis    Osteoarthritis   Basal cell carcinoma 12/06/2016   L ant lat neck anteriro   Basal cell carcinoma 10/20/2016   L ant lat neck posterior   Cancer (HCC)    skin   Hypertension    Osteoporosis    Squamous cell carcinoma of skin 11/23/2017   L neck lat near angle of mandible (SCCIS)    Past Surgical History:  Procedure Laterality Date   BASAL CELL CARCINOMA EXCISION  12/2016   neck   EYE SURGERY     Bilateral cataract Surgery   SQUAMOUS CELL CARCINOMA EXCISION  12/2016   neck   TONSILLECTOMY     TOOTH EXTRACTION      Current Medications: Current Meds  Medication Sig   acetaminophen (TYLENOL) 650 MG CR tablet Take 650 mg by mouth every 8 (eight) hours as needed. Alternates with Ibuprofen.   apixaban (ELIQUIS) 5 MG TABS tablet Take 1 tablet (5 mg total) by mouth 2 (two) times daily.   bisoprolol-hydrochlorothiazide (ZIAC) 2.5-6.25 MG tablet TAKE 1 TABLET EVERY DAY   calcium carbonate (OS-CAL) 600 MG TABS Take 600 mg by mouth daily.   chlorpheniramine  (CHLOR-TRIMETON) 4 MG tablet Take 4 mg by mouth 2 (two) times daily as needed.   cholecalciferol (VITAMIN D) 1000 UNITS tablet Take 2,000 Units by mouth daily.   GARLIC PO Take 1 capsule by mouth daily.   hydrochlorothiazide (HYDRODIURIL) 25 MG tablet Take 1 tablet (25 mg total) by mouth daily.   ketoconazole (NIZORAL) 2 % cream Apply to feet qhs for fungal infection.   levocetirizine (XYZAL) 5 MG tablet Take 5 mg by mouth daily.   Lifitegrast 5 % SOLN Place 1 drop into both eyes 2 (two) times a day.   Multiple Vitamins-Minerals (PRESERVISION AREDS 2) CAPS Take 1 capsule by mouth 2 (two) times a day.   phenylephrine (NEO-SYNEPHRINE) 0.125 % nasal drops Place 1 drop into the nose as needed.   Polyethyl Glycol-Propyl Glycol (SYSTANE OP) Apply to eye 2 (two) times daily as needed.   terbinafine (LAMISIL) 250 MG tablet Take 1 tablet (250 mg total) by mouth daily. 1 pill a day for tinea pedis/unguium   timolol (BETIMOL) 0.5 % ophthalmic solution Place 1 drop into both eyes daily.   verapamil (CALAN-SR) 120 MG CR tablet Take 1 tablet (120 mg total) by mouth at bedtime.     Allergies:   Patient has no known allergies.   Social History   Socioeconomic History   Marital status: Single  Spouse name: Not on file   Number of children: 2   Years of education: Not on file   Highest education level: Not on file  Occupational History   Occupation: Bank Teller  Tobacco Use   Smoking status: Never   Smokeless tobacco: Never  Vaping Use   Vaping Use: Never used  Substance and Sexual Activity   Alcohol use: No    Alcohol/week: 0.0 standard drinks of alcohol   Drug use: No   Sexual activity: Not Currently  Other Topics Concern   Not on file  Social History Narrative   Not on file   Social Determinants of Health   Financial Resource Strain: Low Risk  (11/16/2022)   Overall Financial Resource Strain (CARDIA)    Difficulty of Paying Living Expenses: Not hard at all  Food Insecurity: No Food  Insecurity (11/16/2022)   Hunger Vital Sign    Worried About Running Out of Food in the Last Year: Never true    Ran Out of Food in the Last Year: Never true  Transportation Needs: No Transportation Needs (11/16/2022)   PRAPARE - Administrator, Civil Service (Medical): No    Lack of Transportation (Non-Medical): No  Physical Activity: Inactive (11/16/2022)   Exercise Vital Sign    Days of Exercise per Week: 0 days    Minutes of Exercise per Session: 0 min  Stress: No Stress Concern Present (11/16/2022)   [employer] of Occupational Health - Occupational Stress Questionnaire    Feeling of Stress : Not at all  Social Connections: Moderately Integrated (11/16/2022)   Social Connection and Isolation Panel [NHANES]    Frequency of Communication with Friends and Family: More than three times a week    Frequency of Social Gatherings with Friends and Family: Three times a week    Attends Religious Services: More than 4 times per year    Active Member of Clubs or Organizations: Yes    Attends Engineer, structural: More than 4 times per year    Marital Status: Divorced     Family History: The patient's family history includes Cancer in her father and paternal grandmother; Dementia in her mother; Heart disease in her father, maternal grandfather, and maternal uncle; Hypertension in her brother; Stroke in her maternal grandmother. There is no history of Breast cancer.  ROS:   Please see the history of present illness.     All other systems reviewed and are negative.  EKGs/Labs/Other Studies Reviewed:      EKG:  EKG is  ordered today.  The ekg ordered today demonstrates sinus bradycardia, heart rate 53  Recent Labs: 11/10/2022: ALT 14; BUN 18; Creatinine, Ser 0.82; Hemoglobin 14.5; Platelets 235.0; Potassium 3.4; Sodium 142; TSH 3.05  Recent Lipid Panel    Component Value Date/Time   CHOL 244 (H) 11/10/2022 0854   TRIG 119.0 11/10/2022 0854   HDL 63.00 11/10/2022  0854   CHOLHDL 4 11/10/2022 0854   VLDL 23.8 11/10/2022 0854   LDLCALC 157 (H) 11/10/2022 0854   LDLDIRECT 175.7 09/09/2013 0934    Physical Exam:    VS:  BP (!) 142/82 (BP Location: Left Arm, Patient Position: Sitting, Cuff Size: Large)   Pulse 60   Ht 5\' 3"  (1.6 m)   Wt 176 lb 6.4 oz (80 kg)   SpO2 98%   BMI 31.25 kg/m     Wt Readings from Last 3 Encounters:  02/22/23 176 lb 6.4 oz (80 kg)  11/17/22 173 lb  6 oz (78.6 kg)  11/16/22 175 lb (79.4 kg)     GEN:  Well nourished, well developed in no acute distress HEENT: Normal NECK: No JVD; No carotid bruits CARDIAC: RRR, no murmurs, rubs, gallops RESPIRATORY:  Clear to auscultation without rales, wheezing or rhonchi  ABDOMEN: Soft, non-tender, non-distended MUSCULOSKELETAL:  No edema; No deformity  SKIN: Warm and dry NEUROLOGIC:  Alert and oriented x 3 PSYCHIATRIC:  Normal affect   ASSESSMENT:    1. Paroxysmal atrial fibrillation (HCC)   2. Essential hypertension    PLAN:    In order of problems listed above:  paroxysmal atrial fibrillation.  CHA2DS2-VASc score of 4 (age, htn, gender).  EKG today showing sinus rhythm, PACs, otherwise normal ECG.  Continue Eliquis 5 mg twice daily, verapamil hypertension, BP elevated today, usually controlled normal,  continue verapamil 120 mg, HCTZ 25 mg daily.  Follow-up yearly.  Medication Adjustments/Labs and Tests Ordered: Current medicines are reviewed at length with the patient today.  Concerns regarding medicines are outlined above.  Orders Placed This Encounter  Procedures   EKG 12-Lead   No orders of the defined types were placed in this encounter.   Patient Instructions  Medication Instructions:   Your physician recommends that you continue on your current medications as directed. Please refer to the Current Medication list given to you today.  *If you need a refill on your cardiac medications before your next appointment, please call your pharmacy*   Lab  Work:  None Ordered  If you have labs (blood work) drawn today and your tests are completely normal, you will receive your results only by: MyChart Message (if you have MyChart) OR A paper copy in the mail If you have any lab test that is abnormal or we need to change your treatment, we will call you to review the results.   Testing/Procedures:  None Ordered   Follow-Up: At [facility], you and your health needs are our priority.  As part of our continuing mission to provide you with exceptional heart care, we have created designated Provider Care Teams.  These Care Teams include your primary Cardiologist (physician) and Advanced Practice Providers (APPs -  Physician Assistants and Nurse Practitioners) who all work together to provide you with the care you need, when you need it.  We recommend signing up for the patient portal called "MyChart".  Sign up information is provided on this After Visit Summary.  MyChart is used to connect with patients for Virtual Visits (Telemedicine).  Patients are able to view lab/test results, encounter notes, upcoming appointments, etc.  Non-urgent messages can be sent to your provider as well.   To learn more about what you can do with MyChart, go to [email or web address].    Your next appointment:   12 month(s)  Provider:   You may see Debbe Odea, MD or one of the following Advanced Practice Providers on your designated Care Team:   Nicolasa Ducking, NP Eula Listen, PA-C Cadence Fransico Michael, PA-C Charlsie Quest, NP    Signed, Debbe Odea, MD  02/22/2023 11:47 AM    [facility]

## 2023-03-29 ENCOUNTER — Ambulatory Visit: Payer: Medicare HMO | Admitting: Dermatology

## 2023-03-29 VITALS — BP 131/78 | HR 56

## 2023-03-29 DIAGNOSIS — B353 Tinea pedis: Secondary | ICD-10-CM

## 2023-03-29 DIAGNOSIS — L578 Other skin changes due to chronic exposure to nonionizing radiation: Secondary | ICD-10-CM | POA: Diagnosis not present

## 2023-03-29 DIAGNOSIS — Z872 Personal history of diseases of the skin and subcutaneous tissue: Secondary | ICD-10-CM | POA: Diagnosis not present

## 2023-03-29 DIAGNOSIS — L820 Inflamed seborrheic keratosis: Secondary | ICD-10-CM | POA: Diagnosis not present

## 2023-03-29 DIAGNOSIS — Z7189 Other specified counseling: Secondary | ICD-10-CM

## 2023-03-29 DIAGNOSIS — W908XXA Exposure to other nonionizing radiation, initial encounter: Secondary | ICD-10-CM | POA: Diagnosis not present

## 2023-03-29 DIAGNOSIS — L570 Actinic keratosis: Secondary | ICD-10-CM | POA: Diagnosis not present

## 2023-03-29 DIAGNOSIS — Z79899 Other long term (current) drug therapy: Secondary | ICD-10-CM

## 2023-03-29 DIAGNOSIS — B351 Tinea unguium: Secondary | ICD-10-CM | POA: Diagnosis not present

## 2023-03-29 MED ORDER — TERBINAFINE HCL 250 MG PO TABS
250.0000 mg | ORAL_TABLET | Freq: Every day | ORAL | 1 refills | Status: DC
Start: 2023-03-29 — End: 2023-07-05

## 2023-03-29 NOTE — Progress Notes (Signed)
Follow-Up Visit   Subjective  Monica Guerrero is a 82 y.o. female who presents for the following: ak and tinea follow up.   Reports a spot at nose  The patient has spots, moles and lesions to be evaluated, some may be new or changing and the patient may have concern these could be cancer.   The following portions of the chart were reviewed this encounter and updated as appropriate: medications, allergies, medical history  Review of Systems:  No other skin or systemic complaints except as noted in HPI or Assessment and Plan.  Objective  Well appearing patient in no apparent distress; mood and affect are within normal limits.   A focused examination was performed of the following areas: B/l feet, face, hands, nose  Relevant exam findings are noted in the Assessment and Plan.  nose x 1 Erythematous thin papules/macules with gritty scale.   left forearm x 1, right forearm x 1 (2) Erythematous stuck-on, waxy papule or plaque    Assessment & Plan   ACTINIC DAMAGE - chronic, secondary to cumulative UV radiation exposure/sun exposure over time - diffuse scaly erythematous macules with underlying dyspigmentation - Recommend daily broad spectrum sunscreen SPF 30+ to sun-exposed areas, reapply every 2 hours as needed.  - Recommend staying in the shade or wearing long sleeves, sun glasses (UVA+UVB protection) and wide brim hats (4-inch brim around the entire circumference of the hat). - Call for new or changing lesions.   HISTORY OF PRECANCEROUS ACTINIC KERATOSIS - site(s) of PreCancerous Actinic Keratosis clear today. 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 Condition is symptomatic/ bothersome to patient. Not currently at goal.  Treatment Plan:  Restart Lamisil 250mg  1 po qd for 2 more months   Terbinafine Counseling Terbinafine is an anti-fungal medicine that can be applied to the skin (over the counter) or taken by mouth (prescription) to treat fungal infections. The pill version is often used to treat fungal infections of the nails or scalp. While most people do not have any side effects from taking terbinafine pills, some possible side effects of the medicine can include taste changes, headache, loss of smell, vision changes, nausea, vomiting, or diarrhea.    Rare side effects can include irritation of the liver, allergic reaction, or decrease in blood counts (which may show up as not feeling well or developing an infection). If you are concerned about any of these side effects, please stop the medicine and call your doctor, or in the case of an emergency such as feeling very unwell, seek immediate medical care.    Tinea pedis of both feet  Related Medications ketoconazole (NIZORAL) 2 % cream Apply to feet qhs for fungal infection.  terbinafine (LAMISIL) 250 MG tablet Take 1 tablet (250 mg total) by mouth daily. 1 pill a day for tinea pedis/unguium  Actinic keratosis nose x 1  Actinic keratoses are precancerous spots that appear secondary to cumulative UV  radiation exposure/sun exposure over time. They are chronic with expected duration over 1 year. A portion of actinic keratoses will progress to squamous cell carcinoma of the skin. It is not possible to reliably predict which spots will progress to skin cancer and so treatment is recommended to prevent  development of skin cancer.  Recommend daily broad spectrum sunscreen SPF 30+ to sun-exposed areas, reapply every 2 hours as needed.  Recommend staying in the shade or wearing long sleeves, sun glasses (UVA+UVB protection) and wide brim hats (4-inch brim around the entire circumference of the hat). Call for new or changing lesions.  Destruction of lesion - nose x 1 Complexity: simple   Destruction method: cryotherapy   Informed consent: discussed and consent obtained   Timeout:  patient name, date of birth, surgical site, and procedure verified Lesion destroyed using liquid nitrogen: Yes   Region frozen until ice ball extended beyond lesion: Yes   Outcome: patient tolerated procedure well with no complications   Post-procedure details: wound care instructions given    Inflamed seborrheic keratosis (2) left forearm x 1, right forearm x 1  Symptomatic, irritating, patient would like treated.  Destruction of lesion - left forearm x 1, right forearm x 1 (2) Complexity: simple   Destruction method: cryotherapy   Informed consent: discussed and consent obtained   Timeout:  patient name, date of birth, surgical site, and procedure verified Lesion destroyed using liquid nitrogen: Yes   Region frozen until ice ball extended beyond lesion: Yes   Outcome: patient tolerated procedure well with no complications   Post-procedure details: wound care instructions given      Return in about 8 months (around 11/27/2023) for tinea follow up.  IAsher Muir, CMA, am acting as scribe for Armida Sans, MD.   Documentation: I have reviewed the above documentation for accuracy and completeness, and I agree with the above.  Armida Sans, MD

## 2023-03-29 NOTE — Patient Instructions (Addendum)
Terbinafine Counseling  Terbinafine is an anti-fungal medicine that can be applied to the skin (over the counter) or taken by mouth (prescription) to treat fungal infections. The pill version is often used to treat fungal infections of the nails or scalp. While most people do not have any side effects from taking terbinafine pills, some possible side effects of the medicine can include taste changes, headache, loss of smell, vision changes, nausea, vomiting, or diarrhea.   Rare side effects can include irritation of the liver, allergic reaction, or decrease in blood counts (which may show up as not feeling well or developing an infection). If you are concerned about any of these side effects, please stop the medicine and call your doctor, or in the case of an emergency such as feeling very unwell, seek immediate medical care.    Due to recent changes in healthcare laws, you may see results of your pathology and/or laboratory studies on MyChart before the doctors have had a chance to review them. We understand that in some cases there may be results that are confusing or concerning to you. Please understand that not all results are received at the same time and often the doctors may need to interpret multiple results in order to provide you with the best plan of care or course of treatment. Therefore, we ask that you please give us 2 business days to thoroughly review all your results before contacting the office for clarification. Should we see a critical lab result, you will be contacted sooner.   If You Need Anything After Your Visit  If you have any questions or concerns for your doctor, please call our main line at [phone] and press option 4 to reach your doctor's medical assistant. If no one answers, please leave a voicemail as directed and we will return your call as soon as possible. Messages left after 4 pm will be answered the following business day.   You may also send us a message via  MyChart. We typically respond to MyChart messages within 1-2 business days.  For prescription refills, please ask your pharmacy to contact our office. Our fax number is [phone].  If you have an urgent issue when the clinic is closed that cannot wait until the next business day, you can page your doctor at the number below.    Please note that while we do our best to be available for urgent issues outside of office hours, we are not available 24/7.   If you have an urgent issue and are unable to reach us, you may choose to seek medical care at your doctor's office, retail clinic, urgent care center, or emergency room.  If you have a medical emergency, please immediately call 911 or go to the emergency department.  Pager Numbers  - Dr. Kowalski: [phone]  - Dr. Moye: [phone]  - Dr. Stewart: [phone]  In the event of inclement weather, please call our main line at [phone] for an update on the status of any delays or closures.  Dermatology Medication Tips: Please keep the boxes that topical medications come in in order to help keep track of the instructions about where and how to use these. Pharmacies typically print the medication instructions only on the boxes and not directly on the medication tubes.   If your medication is too expensive, please contact our office at [phone] option 4 or send us a message through MyChart.   We are unable to tell what your co-pay for medications will be   in advance as this is different depending on your insurance coverage. However, we may be able to find a substitute medication at lower cost or fill out paperwork to get insurance to cover a needed medication.   If a prior authorization is required to get your medication covered by your insurance company, please allow us 1-2 business days to complete this process.  Drug prices often vary depending on where the prescription is filled and some pharmacies may offer cheaper  prices.  The website www.goodrx.com contains coupons for medications through different pharmacies. The prices here do not account for what the cost may be with help from insurance (it may be cheaper with your insurance), but the website can give you the price if you did not use any insurance.  - You can print the associated coupon and take it with your prescription to the pharmacy.  - You may also stop by our office during regular business hours and pick up a GoodRx coupon card.  - If you need your prescription sent electronically to a different pharmacy, notify our office through [facility] MyChart or by phone at [phone] option 4.     Si Usted Necesita Algo Despus de Su Visita  Tambin puede enviarnos un mensaje a travs de MyChart. Por lo general respondemos a los mensajes de MyChart en el transcurso de 1 a 2 das hbiles.  Para renovar recetas, por favor pida a su farmacia que se ponga en contacto con nuestra oficina. Nuestro nmero de fax es el [phone].  Si tiene un asunto urgente cuando la clnica est cerrada y que no puede esperar hasta el siguiente da hbil, puede llamar/localizar a su doctor(a) al nmero que aparece a continuacin.   Por favor, tenga en cuenta que aunque hacemos todo lo posible para estar disponibles para asuntos urgentes fuera del horario de oficina, no estamos disponibles las 24 horas del da, los 7 das de la semana.   Si tiene un problema urgente y no puede comunicarse con nosotros, puede optar por buscar atencin mdica  en el consultorio de su doctor(a), en una clnica privada, en un centro de atencin urgente o en una sala de emergencias.  Si tiene una emergencia mdica, por favor llame inmediatamente al 911 o vaya a la sala de emergencias.  Nmeros de bper  - Dr. Kowalski: [phone]  - Dra. Moye: [phone]  - Dra. Stewart: [phone]  En caso de inclemencias del tiempo, por favor llame a nuestra lnea principal al [phone]  para una actualizacin sobre el estado de cualquier retraso o cierre.  Consejos para la medicacin en dermatologa: Por favor, guarde las cajas en las que vienen los medicamentos de uso tpico para ayudarle a seguir las instrucciones sobre dnde y cmo usarlos. Las farmacias generalmente imprimen las instrucciones del medicamento slo en las cajas y no directamente en los tubos del medicamento.   Si su medicamento es muy caro, por favor, pngase en contacto con nuestra oficina llamando al [phone] y presione la opcin 4 o envenos un mensaje a travs de MyChart.   No podemos decirle cul ser su copago por los medicamentos por adelantado ya que esto es diferente dependiendo de la cobertura de su seguro. Sin embargo, es posible que podamos encontrar un medicamento sustituto a menor costo o llenar un formulario para que el seguro cubra el medicamento que se considera necesario.   Si se requiere una autorizacin previa para que su compaa de seguros cubra su medicamento, por favor permtanos de 1 a   2 das hbiles para completar este proceso.  Los precios de los medicamentos varan con frecuencia dependiendo del lugar de dnde se surte la receta y alguna farmacias pueden ofrecer precios ms baratos.  El sitio web www.goodrx.com tiene cupones para medicamentos de diferentes farmacias. Los precios aqu no tienen en cuenta lo que podra costar con la ayuda del seguro (puede ser ms barato con su seguro), pero el sitio web puede darle el precio si no utiliz ningn seguro.  - Puede imprimir el cupn correspondiente y llevarlo con su receta a la farmacia.  - Tambin puede pasar por nuestra oficina durante el horario de atencin regular y recoger una tarjeta de cupones de GoodRx.  - Si necesita que su receta se enve electrnicamente a una farmacia diferente, informe a nuestra oficina a travs de MyChart de [facility] o por telfono llamando al [phone] y presione la opcin 4.  

## 2023-04-02 ENCOUNTER — Encounter: Payer: Self-pay | Admitting: Dermatology

## 2023-05-06 ENCOUNTER — Other Ambulatory Visit: Payer: Self-pay | Admitting: Family Medicine

## 2023-05-09 ENCOUNTER — Other Ambulatory Visit: Payer: Self-pay

## 2023-05-09 MED ORDER — VERAPAMIL HCL ER 120 MG PO TBCR: 120.0000 mg | EXTENDED_RELEASE_TABLET | Freq: Every day | ORAL | 2 refills | Status: DC

## 2023-05-16 DIAGNOSIS — H04123 Dry eye syndrome of bilateral lacrimal glands: Secondary | ICD-10-CM | POA: Diagnosis not present

## 2023-05-16 DIAGNOSIS — H353132 Nonexudative age-related macular degeneration, bilateral, intermediate dry stage: Secondary | ICD-10-CM | POA: Diagnosis not present

## 2023-05-16 DIAGNOSIS — H35371 Puckering of macula, right eye: Secondary | ICD-10-CM | POA: Diagnosis not present

## 2023-05-16 DIAGNOSIS — H401131 Primary open-angle glaucoma, bilateral, mild stage: Secondary | ICD-10-CM | POA: Diagnosis not present

## 2023-06-15 ENCOUNTER — Other Ambulatory Visit: Payer: Self-pay | Admitting: *Deleted

## 2023-06-15 ENCOUNTER — Telehealth: Payer: Self-pay | Admitting: *Deleted

## 2023-06-15 DIAGNOSIS — I480 Paroxysmal atrial fibrillation: Secondary | ICD-10-CM

## 2023-06-15 MED ORDER — APIXABAN 5 MG PO TABS
5.0000 mg | ORAL_TABLET | Freq: Two times a day (BID) | ORAL | 3 refills | Status: DC
Start: 2023-06-15 — End: 2023-10-06

## 2023-06-15 NOTE — Telephone Encounter (Signed)
Patient brought in patient assistance application and provider signed. Application was faxed and filed.

## 2023-07-05 ENCOUNTER — Ambulatory Visit: Payer: Medicare HMO

## 2023-07-05 ENCOUNTER — Telehealth: Payer: Self-pay | Admitting: Cardiology

## 2023-07-05 ENCOUNTER — Encounter: Payer: Self-pay | Admitting: Cardiology

## 2023-07-05 ENCOUNTER — Ambulatory Visit: Payer: Medicare HMO | Attending: Cardiology | Admitting: Cardiology

## 2023-07-05 VITALS — BP 130/77 | HR 60 | Ht 63.0 in | Wt 175.6 lb

## 2023-07-05 DIAGNOSIS — R002 Palpitations: Secondary | ICD-10-CM | POA: Diagnosis not present

## 2023-07-05 DIAGNOSIS — I480 Paroxysmal atrial fibrillation: Secondary | ICD-10-CM

## 2023-07-05 DIAGNOSIS — I10 Essential (primary) hypertension: Secondary | ICD-10-CM

## 2023-07-05 NOTE — Patient Instructions (Signed)
Medication Instructions:  The current medical regimen is effective;  continue present plan and medications.  *If you need a refill on your cardiac medications before your next appointment, please call your pharmacy*   Lab Work: Your provider would like for you to have following labs drawn today CBC, BMET, MAG.   If you have labs (blood work) drawn today and your tests are completely normal, you will receive your results only by: MyChart Message (if you have MyChart) OR A paper copy in the mail If you have any lab test that is abnormal or we need to change your treatment, we will call you to review the results.   Testing/Procedures: Monica Guerrero- Long Term Monitor Instructions  Your physician has requested you wear a ZIO patch monitor for 14 days.  This is a single patch monitor. [employer] supplies one patch monitor per enrollment. Additional stickers are not available. Please do not apply patch if you will be having a Nuclear Stress Test,  Echocardiogram, Cardiac CT, MRI, or Chest Xray during the period you would be wearing the  monitor. The patch cannot be worn during these tests. You cannot remove and re-apply the  ZIO XT patch monitor.  Your ZIO patch monitor will be mailed 3 day USPS to your address on file. It may take 3-5 days  to receive your monitor after you have been enrolled.  Once you have received your monitor, please review the enclosed instructions. Your monitor  has already been registered assigning a specific monitor serial # to you.  Billing and Patient Assistance Program Information  We have supplied [employer] with any of your insurance information on file for billing purposes. [employer] offers a sliding scale Patient Assistance Program for patients that do not have  insurance, or whose insurance does not completely cover the cost of the ZIO monitor.  You must apply for the Patient Assistance Program to qualify for this discounted rate.  To apply, please call [employer] at  [phone], select option 4, select option 2, ask to apply for  Patient Assistance Program. Meredeth Ide will ask your household income, and how many people  are in your household. They will quote your out-of-pocket cost based on that information.  [employer] will also be able to set up a 51-month, interest-free payment plan if needed.  Applying the monitor   Shave hair from upper left chest.  Hold abrader disc by orange tab. Rub abrader in 40 strokes over the upper left chest as  indicated in your monitor instructions.  Clean area with 4 enclosed alcohol pads. Let dry.  Apply patch as indicated in monitor instructions. Patch will be placed under collarbone on left  side of chest with arrow pointing upward.  Rub patch adhesive wings for 2 minutes. Remove white label marked "1". Remove the white  label marked "2". Rub patch adhesive wings for 2 additional minutes.  While looking in a mirror, press and release button in center of patch. A small green light will  flash 3-4 times. This will be your only indicator that the monitor has been turned on.  Do not shower for the first 24 hours. You may shower after the first 24 hours.  Press the button if you feel a symptom. You will hear a small click. Record Date, Time and  Symptom in the Patient Logbook.  When you are ready to remove the patch, follow instructions on the last 2 pages of Patient  Logbook. Stick patch monitor onto the last page of Patient Logbook.  Place Patient Logbook in the blue and white box. Use locking tab on box and tape box closed  securely. The blue and white box has prepaid postage on it. Please place it in the mailbox as  soon as possible. Your physician should have your test results approximately 7 days after the  monitor has been mailed back to [facility].  Call [facility] Customer Care at [phone] if you have questions regarding  your ZIO XT patch monitor. Call them immediately if you see an orange light  blinking on your  monitor.  If your monitor falls off in less than 4 days, contact our Monitor department at [phone].  If your monitor becomes loose or falls off after 4 days call [employer] at [phone] for  suggestions on securing your monitor    Follow-Up: At [facility], you and your health needs are our priority.  As part of our continuing mission to provide you with exceptional heart care, we have created designated Provider Care Teams.  These Care Teams include your primary Cardiologist (physician) and Advanced Practice Providers (APPs -  Physician Assistants and Nurse Practitioners) who all work together to provide you with the care you need, when you need it.  We recommend signing up for the patient portal called "MyChart".  Sign up information is provided on this After Visit Summary.  MyChart is used to connect with patients for Virtual Visits (Telemedicine).  Patients are able to view lab/test results, encounter notes, upcoming appointments, etc.  Non-urgent messages can be sent to your provider as well.   To learn more about what you can do with MyChart, go to [email or web address].    Your next appointment:   6 week(s)  Provider:   Sherie Don, NP

## 2023-07-05 NOTE — Progress Notes (Signed)
Cardiology Office Note Date:  07/05/2023  Patient ID:  Monica Guerrero, Monica Guerrero 11-Aug-1941, MRN [id number] PCP:  Judy Pimple, MD  Cardiologist:  Debbe Odea, MD Electrophysiologist: None    Chief Complaint: afib  History of Present Illness: Monica Guerrero is a 82 y.o. female with PMH notable for parox AFib, HTN, HLD; seen today for None for acute visit due to palpations, dizziness and lightheaded.   She last saw Dr. Azucena Cecil 02/2023, was without complaints. Tolerating eliquis. Afib managed with verapamil 120 and bisprolol 2.5.  She called clinic earlier this morning with c/o palpitations that woke her from sleep at 4am. A few hours later, she began to have palpitations with dizziness, lightheadedness, and presyncope. HR was 112. When nursing staff returned her call, she felt better but noted that HR was still variable.   Now, she feels at her baseline. No palpitations, dizziness, lightheadedness. No chest pain, no SOB. Historically, she has not felt her AFib episodes and so experiencing palpitations this morning was very concerning to her. She has no sick contacts, no GI complaints. Felt well and at her baseline yesterday.  Rarely checks BP at home.  She diligently takes eliquis BID, no missed doses, no bleeding concerns.    AAD History: None   Past Medical History:  Diagnosis Date   Actinic keratosis    Allergy    Arthritis    Osteoarthritis   Basal cell carcinoma 12/06/2016   L ant lat neck anteriro   Basal cell carcinoma 10/20/2016   L ant lat neck posterior   Cancer (HCC)    skin   Hypertension    Osteoporosis    Squamous cell carcinoma of skin 11/23/2017   L neck lat near angle of mandible (SCCIS)    Past Surgical History:  Procedure Laterality Date   BASAL CELL CARCINOMA EXCISION  12/2016   neck   EYE SURGERY     Bilateral cataract Surgery   SQUAMOUS CELL CARCINOMA EXCISION  12/2016   neck   TONSILLECTOMY     TOOTH EXTRACTION      Current Outpatient  Medications  Medication Instructions   acetaminophen (TYLENOL) 650 mg, Oral, Every 8 hours PRN, Alternates with Ibuprofen.    apixaban (ELIQUIS) 5 mg, Oral, 2 times daily   bisoprolol-hydrochlorothiazide (ZIAC) 2.5-6.25 MG tablet TAKE 1 TABLET EVERY DAY   calcium carbonate (OS-CAL) 600 mg, Oral, Daily,     chlorpheniramine (CHLOR-TRIMETON) 4 mg, Oral, 2 times daily PRN,     cholecalciferol (VITAMIN D3) 2,000 Units, Oral, Daily   GARLIC PO 1 capsule, Oral, Daily   hydrochlorothiazide (HYDRODIURIL) 25 mg, Oral, Daily   ketoconazole (NIZORAL) 2 % cream Apply to feet qhs for fungal infection.   levocetirizine (XYZAL) 5 mg, Oral, Daily   Lifitegrast 5 % SOLN 1 drop, Both Eyes, 2 times daily   Multiple Vitamins-Minerals (PRESERVISION AREDS 2) CAPS 1 capsule, Oral, 2 times daily   phenylephrine (NEO-SYNEPHRINE) 0.125 % nasal drops 1 drop, Nasal, As needed   Polyethyl Glycol-Propyl Glycol (SYSTANE OP) Ophthalmic, 2 times daily PRN   timolol (BETIMOL) 0.5 % ophthalmic solution 1 drop, Both Eyes, Daily   verapamil (CALAN-SR) 120 mg, Oral, Daily at bedtime    Social History:  The patient  reports that she has never smoked. She has never used smokeless tobacco. She reports that she does not drink alcohol and does not use drugs.   Family History:   The patient's family history includes Cancer in her father and paternal  grandmother; Dementia in her mother; Heart disease in her father, maternal grandfather, and maternal uncle; Hypertension in her brother; Stroke in her maternal grandmother.  ROS:  Please see the history of present illness. All other systems are reviewed and otherwise negative.   PHYSICAL EXAM:  VS:  BP 130/77 (BP Location: Left Arm, Patient Position: Sitting, Cuff Size: Normal)   Pulse 60   Ht 5\' 3"  (1.6 m)   Wt 175 lb 9.6 oz (79.7 kg)   BMI 31.11 kg/m  BMI: Body mass index is 31.11 kg/m.  GEN- The patient is well appearing, alert and oriented x 3 today.   Lungs- Clear to  ausculation bilaterally, normal work of breathing.  Heart- Regular rate and rhythm, no murmurs, rubs or gallops Extremities- No peripheral edema, warm, dry   EKG is ordered. Personal review of EKG from today shows:    EKG Interpretation Date/Time:  Wednesday July 05 2023 13:14:12 EDT Ventricular Rate:  60 PR Interval:  146 QRS Duration:  72 QT Interval:  406 QTC Calculation: 406 R Axis:   2  Text Interpretation: Normal sinus rhythm Nonspecific ST abnormality Confirmed by Sherie Don [phone]) on 07/05/2023 1:18:26 PM    Recent Labs: 11/10/2022: ALT 14; BUN 18; Creatinine, Ser 0.82; Hemoglobin 14.5; Platelets 235.0; Potassium 3.4; Sodium 142; TSH 3.05  11/10/2022: Cholesterol 244; HDL 63.00; LDL Cholesterol 157; Total CHOL/HDL Ratio 4; Triglycerides 119.0; VLDL 23.8   CrCl cannot be calculated (Patient's most recent lab result is older than the maximum 21 days allowed.).   Wt Readings from Last 3 Encounters:  07/05/23 175 lb 9.6 oz (79.7 kg)  02/22/23 176 lb 6.4 oz (80 kg)  11/17/22 173 lb 6 oz (78.6 kg)     Additional studies reviewed include: Previous EP, cardiology notes.   TTE, 10/09/2019  1. Left ventricular ejection fraction, by visual estimation, is 60 to 65%. The left ventricle has normal function. There is mildly increased left ventricular hypertrophy.  2. Elevated left atrial pressure.  3. Left ventricular diastolic parameters are consistent with Grade Idiastolic dysfunction (impaired relaxation).  4. The left ventricle has no regional wall motion abnormalities.  5. Global right ventricle has normal systolic function.The right ventricular size is normal. Mildly increased right ventricular wall thickness.  6. Left atrial size was elongated.  7. Right atrial size was mildly dilated.  8. The mitral valve is grossly normal. Trivial mitral valve regurgitation. No evidence of mitral stenosis.  9. The tricuspid valve is not well visualized. 10. The tricuspid valve is not  well visualized. Tricuspid valve regurgitation is mild. 11. The aortic valve was not well visualized. Aortic valve regurgitation is not visualized. No evidence of aortic valve sclerosis or stenosis. 12. The pulmonic valve was not well visualized. Pulmonic valve regurgitation is not visualized. 13. Borderline aortic dilatation noted. 14. Mildly elevated pulmonary artery systolic pressure. 15. The inferior vena cava is normal in size with greater than 50% respiratory variability, suggesting right atrial pressure of 3 mmHg.  Long term monitor, 08/18/2019 Patient had a min HR of 41 bpm, max HR of 176 bpm, and avg HR of 61 bpm. Predominant underlying rhythm was Sinus Rhythm. 3191 Supraventricular Tachycardia runs occurred, the run with the fastest interval lasting 1 min 51 secs with a max rate of 176 bpm, the longest lasting 2 mins 52 secs with an avg rate of 149 bpm. Atrial Fibrillation occurred (5% burden), ranging from 57-146 bpm (avg of 87 bpm), the longest lasting 26 mins 10 secs with  an avg rate of 91 bpm. Supraventricular Tachycardia and Atrial Fibrillation were detected within +/- 45 seconds of symptomatic patient event(s).   ASSESSMENT AND PLAN:  #) parox afib #) palpitations, presyncope Has not had symptomatic AFib episode in many years prior to this morning's episode Palpitations self-terminated Discussed AAD vs procedural treatments, patient prefers to monitor at this time given low afib burden Update BMP, Mag Update 2 week zio If AF burden elevated, update TTE Continue ziac 2.5-6.25 daily Continue verapamil 120mg  daily  #) Hypercoag d/t parox afib CHA2DS2-VASc Score = 4 [CHF History: 0, HTN History: 1, Diabetes History: 0, Stroke History: 0, Vascular Disease History: 0, Age Score: 2, Gender Score: 1].  Therefore, the patient's annual risk of stroke is 4.8 %.    Stroke ppx - 5mg  eliquis BID, appropriately dosed No bleeding concerns Update CBC        Current medicines  are reviewed at length with the patient today.   The patient does not have concerns regarding her medicines.  The following changes were made today:  none  Labs/ tests ordered today include:  Orders Placed This Encounter  Procedures   CBC   Basic metabolic panel   Magnesium   LONG TERM MONITOR (3-14 DAYS)   EKG 12-Lead     Disposition: Follow up with EP APP   6 weeks    Signed, Sherie Don, NP  07/05/23  1:59 PM  Electrophysiology [facility]

## 2023-07-05 NOTE — Telephone Encounter (Signed)
Called patient, advised that she woke up this morning around 4:00 AM, with her heart skipping around, HR and BP was elevated- HR running 112. Patient does have history of PAF, but states she was lightheaded, dizzy, and felt like she was going to pass out. She states she just recently started feeling better around 7:00 AM, stating that her HR still is all over the place, and would like to make sure she is okay. Per patient this is all new to her and she this episode scared her. She denies SOB, and CP at this time. Blood pressure below is off for her but she states her blood pressure cuff has not been working correctly.   Advised we could see patient today with NP at 1:30 PM, have EKG and BP rechecked.   Patient is currently on her medication list, including her Eliquis BID.   Will route to NP to make aware. Thanks!

## 2023-07-05 NOTE — Telephone Encounter (Signed)
Patient c/o Palpitations:  STAT if patient reporting lightheadedness, shortness of breath, or chest pain  How long have you had palpitations/irregular HR/ Afib? Are you having the symptoms now? Occurred at 4:00 am and is active now   Are you currently experiencing lightheadedness, SOB or CP? No   Do you have a history of afib (atrial fibrillation) or irregular heart rhythm? Yes  Have you checked your BP or HR? (document readings if available): 127/101 HR 112  Are you experiencing any other symptoms? No   Was weak with cold sweat and felt she was going to pass out at 6 am. Does not c/o any symptoms at this time.

## 2023-07-06 DIAGNOSIS — I480 Paroxysmal atrial fibrillation: Secondary | ICD-10-CM | POA: Diagnosis not present

## 2023-07-06 DIAGNOSIS — I10 Essential (primary) hypertension: Secondary | ICD-10-CM | POA: Diagnosis not present

## 2023-07-07 DIAGNOSIS — I480 Paroxysmal atrial fibrillation: Secondary | ICD-10-CM | POA: Diagnosis not present

## 2023-07-07 LAB — CBC
Hematocrit: 43.3 % (ref 34.0–46.6)
Hemoglobin: 14.1 g/dL (ref 11.1–15.9)
MCH: 30.3 pg (ref 26.6–33.0)
MCHC: 32.6 g/dL (ref 31.5–35.7)
MCV: 93 fL (ref 79–97)
Platelets: 220 10*3/uL (ref 150–450)
RBC: 4.65 x10E6/uL (ref 3.77–5.28)
RDW: 12.9 % (ref 11.7–15.4)
WBC: 5 10*3/uL (ref 3.4–10.8)

## 2023-07-07 LAB — BASIC METABOLIC PANEL
BUN/Creatinine Ratio: 24 (ref 12–28)
BUN: 21 mg/dL (ref 8–27)
CO2: 28 mmol/L (ref 20–29)
Calcium: 10 mg/dL (ref 8.7–10.3)
Chloride: 101 mmol/L (ref 96–106)
Creatinine, Ser: 0.88 mg/dL (ref 0.57–1.00)
Glucose: 78 mg/dL (ref 70–99)
Potassium: 3.6 mmol/L (ref 3.5–5.2)
Sodium: 143 mmol/L (ref 134–144)
eGFR: 66 mL/min/{1.73_m2} (ref >59)

## 2023-07-07 LAB — MAGNESIUM: Magnesium: 1.9 mg/dL (ref 1.6–2.3)

## 2023-07-27 DIAGNOSIS — I480 Paroxysmal atrial fibrillation: Secondary | ICD-10-CM | POA: Diagnosis not present

## 2023-07-31 ENCOUNTER — Telehealth: Payer: Self-pay | Admitting: Cardiology

## 2023-07-31 NOTE — Telephone Encounter (Signed)
-----   Message from Nurse Rinaldo Cloud A sent at 07/28/2023 11:03 AM EST ----- Could you assist with this? Change follow up to May with general cardiology. ----- Message ----- From: Sherie Don, NP Sent: 07/28/2023  10:25 AM EST To: [facility] Triage  Rare episodes of SVT - fast rhythms that start in the top chambers of the heart. These episodes were not associated with triggered events.  No afib.  No additional workup needed.   Let's change follow-up to May 2025 with gen cards

## 2023-07-31 NOTE — Telephone Encounter (Signed)
Left voicemail to schedule for May 2025 with general cardiology

## 2023-07-31 NOTE — Telephone Encounter (Signed)
Recall placed for reminder

## 2023-08-16 ENCOUNTER — Ambulatory Visit: Payer: Medicare HMO | Admitting: Cardiology

## 2023-08-18 ENCOUNTER — Ambulatory Visit: Payer: Medicare HMO | Admitting: Cardiology

## 2023-10-06 ENCOUNTER — Other Ambulatory Visit: Payer: Self-pay

## 2023-10-06 DIAGNOSIS — I480 Paroxysmal atrial fibrillation: Secondary | ICD-10-CM

## 2023-10-06 MED ORDER — APIXABAN 5 MG PO TABS: 5.0000 mg | ORAL_TABLET | Freq: Two times a day (BID) | ORAL | 3 refills | Status: DC

## 2023-10-06 NOTE — Telephone Encounter (Signed)
Prescription refill request for Eliquis received. Indication:afib Last office visit:10/24 Scr:0.88  10/24 Age: 83 Weight:79.7  kg  Prescription refilled

## 2023-11-01 ENCOUNTER — Other Ambulatory Visit: Payer: Self-pay | Admitting: Family Medicine

## 2023-11-02 NOTE — Telephone Encounter (Signed)
 Spoke to pt, scheduled cpe for 11/28/23

## 2023-11-02 NOTE — Telephone Encounter (Signed)
 Pt is due for her CPE (labs prior) on or after 11/18/23, please schedule and route back to me

## 2023-11-09 DIAGNOSIS — H401131 Primary open-angle glaucoma, bilateral, mild stage: Secondary | ICD-10-CM | POA: Diagnosis not present

## 2023-11-11 ENCOUNTER — Other Ambulatory Visit: Payer: Self-pay | Admitting: Family Medicine

## 2023-11-19 ENCOUNTER — Telehealth: Payer: Self-pay | Admitting: Family Medicine

## 2023-11-19 DIAGNOSIS — Z131 Encounter for screening for diabetes mellitus: Secondary | ICD-10-CM | POA: Insufficient documentation

## 2023-11-19 DIAGNOSIS — E559 Vitamin D deficiency, unspecified: Secondary | ICD-10-CM

## 2023-11-19 DIAGNOSIS — R7303 Prediabetes: Secondary | ICD-10-CM | POA: Insufficient documentation

## 2023-11-19 DIAGNOSIS — E038 Other specified hypothyroidism: Secondary | ICD-10-CM

## 2023-11-19 DIAGNOSIS — M810 Age-related osteoporosis without current pathological fracture: Secondary | ICD-10-CM

## 2023-11-19 DIAGNOSIS — I10 Essential (primary) hypertension: Secondary | ICD-10-CM

## 2023-11-19 DIAGNOSIS — E7800 Pure hypercholesterolemia, unspecified: Secondary | ICD-10-CM

## 2023-11-19 NOTE — Telephone Encounter (Signed)
-----   Message from Alvina Chou sent at 11/06/2023  2:35 PM EST ----- Regarding: Lab orders for Wed, 3.19.25 Patient is scheduled for CPX labs, please order future labs, Thanks , Camelia Eng

## 2023-11-21 ENCOUNTER — Ambulatory Visit (INDEPENDENT_AMBULATORY_CARE_PROVIDER_SITE_OTHER): Payer: Medicare HMO

## 2023-11-21 VITALS — Ht 63.0 in | Wt 175.0 lb

## 2023-11-21 DIAGNOSIS — Z0000 Encounter for general adult medical examination without abnormal findings: Secondary | ICD-10-CM | POA: Diagnosis not present

## 2023-11-21 NOTE — Progress Notes (Signed)
 Subjective:   Monica Guerrero is a 83 y.o. who presents for a Medicare Wellness preventive visit.  Visit Complete: Virtual I connected with  Domingo Mend on 11/21/23 by a audio enabled telemedicine application and verified that I am speaking with the correct person using two identifiers.  Patient Location: Home  Provider Location: Home Office  I discussed the limitations of evaluation and management by telemedicine. The patient expressed understanding and agreed to proceed.  Vital Signs: Because this visit was a virtual/telehealth visit, some criteria may be missing or patient reported. Any vitals not documented were not able to be obtained and vitals that have been documented are patient reported.  VideoDeclined- This patient declined Librarian, academic. Therefore the visit was completed with audio only.  Persons Participating in Visit: Patient.  AWV Questionnaire: Yes: Patient Medicare AWV questionnaire was completed by the patient on 11/17/23; I have confirmed that all information answered by patient is correct and no changes since this date.  Cardiac Risk Factors include: advanced age (>38men, >29 women);hypertension;obesity (BMI >30kg/m2)    Objective:    Today's Vitals   11/21/23 1042  Weight: 175 lb (79.4 kg)  Height: 5\' 3"  (1.6 m)   Body mass index is 31 kg/m.     11/21/2023   10:55 AM 11/16/2022   10:19 AM 05/06/2020    1:44 PM 12/28/2017    8:25 AM 12/22/2016    2:08 PM  Advanced Directives  Does Patient Have a Medical Advance Directive? Yes Yes Yes Yes Yes  Type of Estate agent of [place];Living will Healthcare Power of [place];Living will Healthcare Power of [place];Living will Healthcare Power of [place];Living will Healthcare Power of [place];Living will  Does patient want to make changes to medical advance directive?    No - Patient declined   Copy of Healthcare Power of Attorney in Chart? Yes - validated most  recent copy scanned in chart (See row information) No - copy requested No - copy requested Yes No - copy requested  Would patient like information on creating a medical advance directive?    No - Patient declined     Current Medications (verified) Outpatient Encounter Medications as of 11/21/2023  Medication Sig   acetaminophen (TYLENOL) 650 MG CR tablet Take 650 mg by mouth every 8 (eight) hours as needed. Alternates with Ibuprofen.   apixaban (ELIQUIS) 5 MG TABS tablet Take 1 tablet (5 mg total) by mouth 2 (two) times daily.   bisoprolol-hydrochlorothiazide (ZIAC) 2.5-6.25 MG tablet TAKE 1 TABLET EVERY DAY   calcium carbonate (OS-CAL) 600 MG TABS Take 600 mg by mouth daily.   chlorpheniramine (CHLOR-TRIMETON) 4 MG tablet Take 4 mg by mouth 2 (two) times daily as needed.   cholecalciferol (VITAMIN D) 1000 UNITS tablet Take 2,000 Units by mouth daily.   GARLIC PO Take 1 capsule by mouth daily.   hydrochlorothiazide (HYDRODIURIL) 25 MG tablet TAKE 1 TABLET EVERY DAY   levocetirizine (XYZAL) 5 MG tablet Take 5 mg by mouth daily.   Lifitegrast 5 % SOLN Place 1 drop into both eyes 2 (two) times a day.   Multiple Vitamins-Minerals (PRESERVISION AREDS 2) CAPS Take 1 capsule by mouth 2 (two) times a day.   phenylephrine (NEO-SYNEPHRINE) 0.125 % nasal drops Place 1 drop into the nose as needed.   Polyethyl Glycol-Propyl Glycol (SYSTANE OP) Apply to eye 2 (two) times daily as needed.   timolol (BETIMOL) 0.5 % ophthalmic solution Place 1 drop into both eyes daily.  verapamil (CALAN-SR) 120 MG CR tablet Take 1 tablet (120 mg total) by mouth at bedtime.   No facility-administered encounter medications on file as of 11/21/2023.    Allergies (verified) Patient has no known allergies.   History: Past Medical History:  Diagnosis Date   Actinic keratosis    Allergy    Arthritis    Osteoarthritis   Basal cell carcinoma 12/06/2016   L ant lat neck anteriro   Basal cell carcinoma 10/20/2016   L ant  lat neck posterior   Cancer (HCC)    skin   Hypertension    Osteoporosis    Squamous cell carcinoma of skin 11/23/2017   L neck lat near angle of mandible (SCCIS)   Past Surgical History:  Procedure Laterality Date   BASAL CELL CARCINOMA EXCISION  12/2016   neck   EYE SURGERY     Bilateral cataract Surgery   SQUAMOUS CELL CARCINOMA EXCISION  12/2016   neck   TONSILLECTOMY     TOOTH EXTRACTION     Family History  Problem Relation Age of Onset   Cancer Father        Kidney   Heart disease Father    Cancer Paternal Grandmother    Dementia Mother    Hypertension Brother    Heart disease Maternal Uncle    Stroke Maternal Grandmother    Heart disease Maternal Grandfather    Breast cancer Neg Hx    Social History   Socioeconomic History   Marital status: Single    Spouse name: Not on file   Number of children: 2   Years of education: Not on file   Highest education level: Not on file  Occupational History   Occupation: Bank T[employer]  Tobacco Use   Smoking status: Never   Smokeless tobacco: Never  Vaping Use   Vaping status: Never Used  Substance and Sexual Activity   Alcohol use: No    Alcohol/week: 0.0 standard drinks of alcohol   Drug use: No   Sexual activity: Not Currently  Other Topics Concern   Not on file  Social History Narrative   Not on file   Social Drivers of Health   Financial Resource Strain: Low Risk  (11/21/2023)   Overall Financial Resource Strain (CARDIA)    Difficulty of Paying Living Expenses: Not hard at all  Food Insecurity: No Food Insecurity (11/21/2023)   Hunger Vital Sign    Worried About Running Out of Food in the Last Year: Never true    Ran Out of Food in the Last Year: Never true  Transportation Needs: No Transportation Needs (11/21/2023)   PRAPARE - Administrator, Civil Service (Medical): No    Lack of Transportation (Non-Medical): No  Physical Activity: Insufficiently Active (11/21/2023)   Exercise Vital Sign     Days of Exercise per Week: 3 days    Minutes of Exercise per Session: 30 min  Stress: No Stress Concern Present (11/21/2023)   [employer] of Occupational Health - Occupational Stress Questionnaire    Feeling of Stress : Not at all  Social Connections: Moderately Integrated (11/21/2023)   Social Connection and Isolation Panel [NHANES]    Frequency of Communication with Friends and Family: More than three times a week    Frequency of Social Gatherings with Friends and Family: Three times a week    Attends Religious Services: More than 4 times per year    Active Member of Clubs or Organizations: No  Attends Banker Meetings: Never    Marital Status: Married    Tobacco Counseling Counseling given: Not Answered    Clinical Intake:  Pre-visit preparation completed: Yes  Pain : No/denies pain     BMI - recorded: 31 Nutritional Status: BMI > 30  Obese Nutritional Risks: None Diabetes: No  How often do you need to have someone help you when you read instructions, pamphlets, or other written materials from your doctor or pharmacy?: 1 - Never  Interpreter Needed?: No  Comments: lives alone Information entered by :: B.Annaly Skop,LPN   Activities of Daily Living     11/17/2023    9:03 PM  In your present state of health, do you have any difficulty performing the following activities:  Hearing? 1  Vision? 0  Difficulty concentrating or making decisions? 0  Walking or climbing stairs? 0  Dressing or bathing? 0  Doing errands, shopping? 0  Preparing Food and eating ? N  Using the Toilet? N  In the past six months, have you accidently leaked urine? Y  Do you have problems with loss of bowel control? N  Managing your Medications? N  Managing your Finances? N  Housekeeping or managing your Housekeeping? N    Patient Care Team: Tower, Audrie Gallus, MD as PCP - General Debbe Odea, MD as PCP - Cardiology (Cardiology) Lockie Mola, MD as Referring  Physician (Ophthalmology) Otho Ket, RN as [facility] Care Management  Indicate any recent Medical Services you may have received from other than [facility] providers in the past year (date may be approximate).     Assessment:   This is a routine wellness examination for Monica Guerrero.  Hearing/Vision screen Hearing Screening - Comments:: Pt says her hearing is getting worse: can hear sounds but cannot distinguish different conversation Vision Screening - Comments:: Pt says her vision is not good: glaucoma, MD, and dry eyes&quot;has drops;reading not clear Dr Inez Pilgrim   Goals Addressed             This Visit's Progress    DIET - INCREASE WATER INTAKE       11/21/23-I will attempt to drink at least 4-6 glasses of water daily.      Patient Stated   On track    11/21/23-, I will maintain and continue medications as prescribed.       Depression Screen     11/21/2023   10:51 AM 11/16/2022   10:19 AM 10/26/2021    9:42 AM 05/06/2020    1:44 PM 02/04/2019    2:12 PM 12/28/2017    8:23 AM 12/22/2016    1:52 PM  PHQ 2/9 Scores  PHQ - 2 Score 0 0 0 0 0 0 0  PHQ- 9 Score    0  0     Fall Risk     11/17/2023    9:03 PM 11/16/2022   10:20 AM 10/26/2021    9:27 AM 05/06/2020    1:44 PM 03/28/2019   12:28 PM  Fall Risk   Falls in the past year? 1 0 0 1 0  Comment    tripped over something   Number falls in past yr: 0 0  0 0  Injury with Fall? 0 0  0 0  Risk for fall due to : No Fall Risks No Fall Risks  Medication side effect   Follow up Education provided;Falls prevention discussed Falls prevention discussed;Falls evaluation completed Falls evaluation completed Falls evaluation completed;Falls prevention discussed Falls evaluation completed  MEDICARE RISK AT HOME:  Medicare Risk at Home Any stairs in or around the home?: (Patient-Rptd) Yes If so, are there any without handrails?: (Patient-Rptd) No Home free of loose throw rugs in walkways, pet beds, electrical cords, etc?:  (Patient-Rptd) Yes Adequate lighting in your home to reduce risk of falls?: (Patient-Rptd) Yes Life alert?: (Patient-Rptd) No Use of a cane, walker or w/c?: (Patient-Rptd) No Grab bars in the bathroom?: (Patient-Rptd) Yes Shower chair or bench in shower?: (Patient-Rptd) Yes Elevated toilet seat or a handicapped toilet?: (Patient-Rptd) No  TIMED UP AND GO:  Was the test performed?  No  Cognitive Function: 6CIT completed    05/06/2020    1:46 PM 12/28/2017    8:25 AM 12/22/2016    1:51 PM  MMSE - Mini Mental State Exam  Orientation to time 5 5 5   Orientation to Place 5 5 5   Registration 3 3 3   Attention/ Calculation 5 0 0  Recall 3 3 3   Language- name 2 objects  0 0  Language- repeat 1 1 1   Language- follow 3 step command  3 3  Language- read & follow direction  0 0  Write a sentence  0 0  Copy design  0 0  Total score  20 20        11/21/2023   10:57 AM 11/16/2022   10:22 AM  6CIT Screen  What Year? 0 points 0 points  What month? 0 points 0 points  What time? 0 points 0 points  Count back from 20 0 points 0 points  Months in reverse 0 points 0 points  Repeat phrase 0 points 0 points  Total Score 0 points 0 points    Immunizations Immunization History  Administered Date(s) Administered   Fluad Quad(high Dose 65+) 05/30/2019, 05/15/2020   Influenza Split 06/14/2012   Influenza Whole 06/22/2007, 06/02/2008, 06/10/2009, 06/09/2010   Influenza, High Dose Seasonal PF 06/07/2021, 05/30/2022   Influenza,inj,Quad PF,6+ Mos 05/30/2013, 06/16/2014, 06/19/2015, 06/07/2016, 06/08/2017, 06/14/2018   PFIZER(Purple Top)SARS-COV-2 Vaccination 12/26/2019, 01/21/2020, 09/08/2020   PPD Test 09/13/2013, 11/05/2013   Pneumococcal Conjugate-13 10/31/2014   Pneumococcal Polysaccharide-23 04/19/2011   Td 09/05/2002, 08/08/2012    Screening Tests Health Maintenance  Topic Date Due   Zoster Vaccines- Shingrix (1 of 2) Never done   DTaP/Tdap/Td (3 - Tdap) 08/08/2022   INFLUENZA  VACCINE  04/06/2023   COVID-19 Vaccine (4 - 2024-25 season) 05/07/2023   MAMMOGRAM  01/25/2024   Medicare Annual Wellness (AWV)  11/20/2024   Pneumonia Vaccine 83+ Years old  Completed   DEXA SCAN  Completed   HPV VACCINES  Aged Out    Health Maintenance  Health Maintenance Due  Topic Date Due   Zoster Vaccines- Shingrix (1 of 2) Never done   DTaP/Tdap/Td (3 - Tdap) 08/08/2022   INFLUENZA VACCINE  04/06/2023   COVID-19 Vaccine (4 - 2024-25 season) 05/07/2023   Health Maintenance Items Addressed:  Additional Screening:  Vision Screening: Recommended annual ophthalmology exams for early detection of glaucoma and other disorders of the eye.  Dental Screening: Recommended annual dental exams for proper oral hygiene  Community Resource Referral / Chronic Care Management: CRR required this visit?  No   CCM required this visit?  No     Plan:     I have personally reviewed and noted the following in the patient's chart:   Medical and social history Use of alcohol, tobacco or illicit drugs  Current medications and supplements including opioid prescriptions. Patient is not currently  taking opioid prescriptions. Functional ability and status Nutritional status Physical activity Advanced directives List of other physicians Hospitalizations, surgeries, and ER visits in previous 12 months Vitals Screenings to include cognitive, depression, and falls Referrals and appointments  In addition, I have reviewed and discussed with patient certain preventive protocols, quality metrics, and best practice recommendations. A written personalized care plan for preventive services as well as general preventive health recommendations were provided to patient.     Sue Lush, LPN   [id number]/36/6440   After Visit Summary: (MyChart) Due to this being a telephonic visit, the after visit summary with patients personalized plan was offered to patient via MyChart   Notes: Nothing significant  to report at this time.

## 2023-11-21 NOTE — Patient Instructions (Addendum)
 Monica Guerrero , Thank you for taking time to come for your Medicare Wellness Visit. I appreciate your ongoing commitment to your health goals. Please review the following plan we discussed and let me know if I can assist you in the future.   Referrals/Orders/Follow-Ups/Clinician Recommendations: none  This is a list of the screening recommended for you and due dates:  Health Maintenance  Topic Date Due   Zoster (Shingles) Vaccine (1 of 2) Never done   DTaP/Tdap/Td vaccine (3 - Tdap) 08/08/2022   Flu Shot  04/06/2023   COVID-19 Vaccine (4 - 2024-25 season) 05/07/2023   Mammogram  01/25/2024   Medicare Annual Wellness Visit  11/20/2024   Pneumonia Vaccine  Completed   DEXA scan (bone density measurement)  Completed   HPV Vaccine  Aged Out    Advanced directives: (In Chart) A copy of your advanced directives are scanned into your chart should your provider ever need it.  Next Medicare Annual Wellness Visit scheduled for next year: Yes 11/21/24 @ 10:50am televisit

## 2023-11-22 ENCOUNTER — Other Ambulatory Visit (INDEPENDENT_AMBULATORY_CARE_PROVIDER_SITE_OTHER): Payer: Medicare HMO

## 2023-11-22 DIAGNOSIS — I10 Essential (primary) hypertension: Secondary | ICD-10-CM

## 2023-11-22 DIAGNOSIS — E7800 Pure hypercholesterolemia, unspecified: Secondary | ICD-10-CM | POA: Diagnosis not present

## 2023-11-22 DIAGNOSIS — E559 Vitamin D deficiency, unspecified: Secondary | ICD-10-CM

## 2023-11-22 DIAGNOSIS — Z131 Encounter for screening for diabetes mellitus: Secondary | ICD-10-CM

## 2023-11-22 DIAGNOSIS — E038 Other specified hypothyroidism: Secondary | ICD-10-CM

## 2023-11-22 LAB — LIPID PANEL
Cholesterol: 219 mg/dL — ABNORMAL HIGH (ref 0–200)
HDL: 54.3 mg/dL (ref >39.00)
LDL Cholesterol: 135 mg/dL — ABNORMAL HIGH (ref 0–99)
NonHDL: 164.22
Total CHOL/HDL Ratio: 4
Triglycerides: 145 mg/dL (ref 0.0–149.0)
VLDL: 29 mg/dL (ref 0.0–40.0)

## 2023-11-22 LAB — CBC WITH DIFFERENTIAL/PLATELET
Basophils Absolute: 0 10*3/uL (ref 0.0–0.1)
Basophils Relative: 0.2 % (ref 0.0–3.0)
Eosinophils Absolute: 0.1 10*3/uL (ref 0.0–0.7)
Eosinophils Relative: 1.8 % (ref 0.0–5.0)
HCT: 41.2 % (ref 36.0–46.0)
Hemoglobin: 13.8 g/dL (ref 12.0–15.0)
Lymphocytes Relative: 34.3 % (ref 12.0–46.0)
Lymphs Abs: 1.3 10*3/uL (ref 0.7–4.0)
MCHC: 33.6 g/dL (ref 30.0–36.0)
MCV: 90.9 fl (ref 78.0–100.0)
Monocytes Absolute: 0.3 10*3/uL (ref 0.1–1.0)
Monocytes Relative: 9.2 % (ref 3.0–12.0)
Neutro Abs: 2 10*3/uL (ref 1.4–7.7)
Neutrophils Relative %: 54.5 % (ref 43.0–77.0)
Platelets: 240 10*3/uL (ref 150.0–400.0)
RBC: 4.53 Mil/uL (ref 3.87–5.11)
RDW: 13 % (ref 11.5–15.5)
WBC: 3.7 10*3/uL — ABNORMAL LOW (ref 4.0–10.5)

## 2023-11-22 LAB — T4, FREE: Free T4: 0.84 ng/dL (ref 0.60–1.60)

## 2023-11-22 LAB — COMPREHENSIVE METABOLIC PANEL
ALT: 14 U/L (ref 0–35)
AST: 18 U/L (ref 0–37)
Albumin: 4.5 g/dL (ref 3.5–5.2)
Alkaline Phosphatase: 57 U/L (ref 39–117)
BUN: 19 mg/dL (ref 6–23)
CO2: 36 meq/L — ABNORMAL HIGH (ref 19–32)
Calcium: 10.1 mg/dL (ref 8.4–10.5)
Chloride: 101 meq/L (ref 96–112)
Creatinine, Ser: 0.74 mg/dL (ref 0.40–1.20)
GFR: 75.54 mL/min (ref >60.00)
Glucose, Bld: 91 mg/dL (ref 70–99)
Potassium: 3.5 meq/L (ref 3.5–5.1)
Sodium: 143 meq/L (ref 135–145)
Total Bilirubin: 0.7 mg/dL (ref 0.2–1.2)
Total Protein: 6.5 g/dL (ref 6.0–8.3)

## 2023-11-22 LAB — VITAMIN D 25 HYDROXY (VIT D DEFICIENCY, FRACTURES): VITD: 42.49 ng/mL (ref 30.00–100.00)

## 2023-11-22 LAB — TSH: TSH: 2.58 u[IU]/mL (ref 0.35–5.50)

## 2023-11-22 LAB — HEMOGLOBIN A1C: Hgb A1c MFr Bld: 5.8 % (ref 4.6–6.5)

## 2023-11-28 ENCOUNTER — Encounter: Payer: Self-pay | Admitting: Family Medicine

## 2023-11-28 ENCOUNTER — Ambulatory Visit (INDEPENDENT_AMBULATORY_CARE_PROVIDER_SITE_OTHER): Payer: Medicare HMO | Admitting: Family Medicine

## 2023-11-28 VITALS — BP 126/80 | HR 54 | Temp 98.2°F | Ht 62.5 in | Wt 175.1 lb

## 2023-11-28 DIAGNOSIS — Z1231 Encounter for screening mammogram for malignant neoplasm of breast: Secondary | ICD-10-CM

## 2023-11-28 DIAGNOSIS — E2839 Other primary ovarian failure: Secondary | ICD-10-CM

## 2023-11-28 DIAGNOSIS — R7303 Prediabetes: Secondary | ICD-10-CM | POA: Diagnosis not present

## 2023-11-28 DIAGNOSIS — Z0000 Encounter for general adult medical examination without abnormal findings: Secondary | ICD-10-CM

## 2023-11-28 DIAGNOSIS — E7800 Pure hypercholesterolemia, unspecified: Secondary | ICD-10-CM | POA: Diagnosis not present

## 2023-11-28 DIAGNOSIS — I10 Essential (primary) hypertension: Secondary | ICD-10-CM

## 2023-11-28 DIAGNOSIS — E038 Other specified hypothyroidism: Secondary | ICD-10-CM | POA: Diagnosis not present

## 2023-11-28 DIAGNOSIS — I480 Paroxysmal atrial fibrillation: Secondary | ICD-10-CM

## 2023-11-28 DIAGNOSIS — M810 Age-related osteoporosis without current pathological fracture: Secondary | ICD-10-CM

## 2023-11-28 DIAGNOSIS — Z1211 Encounter for screening for malignant neoplasm of colon: Secondary | ICD-10-CM | POA: Diagnosis not present

## 2023-11-28 DIAGNOSIS — E559 Vitamin D deficiency, unspecified: Secondary | ICD-10-CM | POA: Diagnosis not present

## 2023-11-28 DIAGNOSIS — E669 Obesity, unspecified: Secondary | ICD-10-CM

## 2023-11-28 NOTE — Assessment & Plan Note (Signed)
 Discussed how this problem influences overall health and the risks it imposes  Reviewed plan for weight loss with lower calorie diet (via better food choices (lower glycemic and portion control) along with exercise building up to or more than 30 minutes 5 days per week including some aerobic activity and strength training

## 2023-11-28 NOTE — Assessment & Plan Note (Signed)
 Dexa ordered

## 2023-11-28 NOTE — Progress Notes (Signed)
 Subjective:    Patient ID: Monica Guerrero, female    DOB: 25-Sep-1940, 83 y.o.   MRN: [id number]  HPI  Here for health maintenance exam and to review chronic medical problems   Wt Readings from Last 3 Encounters:  11/28/23 175 lb 2 oz (79.4 kg)  11/21/23 175 lb (79.4 kg)  07/05/23 175 lb 9.6 oz (79.7 kg)   31.52 kg/m  Vitals:   11/28/23 1021  BP: 126/80  Pulse: (!) 54  Temp: 98.2 F (36.8 C)  SpO2: 99%    Immunization History  Administered Date(s) Administered   Fluad Quad(high Dose 65+) 05/30/2019, 05/15/2020   Influenza Split 06/14/2012   Influenza Whole 06/22/2007, 06/02/2008, 06/10/2009, 06/09/2010   Influenza, High Dose Seasonal PF 06/07/2021, 05/30/2022, 06/06/2023   Influenza,inj,Quad PF,6+ Mos 05/30/2013, 06/16/2014, 06/19/2015, 06/07/2016, 06/08/2017, 06/14/2018   PFIZER(Purple Top)SARS-COV-2 Vaccination 12/26/2019, 01/21/2020, 09/08/2020   PPD Test 09/13/2013, 11/05/2013   Pneumococcal Conjugate-13 10/31/2014   Pneumococcal Polysaccharide-23 04/19/2011   Td 09/05/2002, 08/08/2012    There are no preventive care reminders to display for this patient.  Shingrix - declines   Tetanus - can ask at pharmacist    Mammogram 01/2023 -[place] Self breast exam-no lumps   Gyn health No problems    Colon cancer screening  Neg cologuard 06/2022    Bone health  Dexa  12/2021 at [place]  Osteoporosis  Falls-one fall/ carrying groceries /in rain / no injuries  [employer] - ca and D  Last vitamin D Lab Results  Component Value Date   VD25OH 42.49 11/22/2023    Exercise : Working in the house  Has an exercise group  Lots of yard work     Mood    11/21/2023   10:51 AM 11/16/2022   10:19 AM 10/26/2021    9:42 AM 05/06/2020    1:44 PM 02/04/2019    2:12 PM  Depression screen PHQ 2/9  Decreased Interest 0 0 0 0 0  Down, Depressed, Hopeless 0 0 0 0 0  PHQ - 2 Score 0 0 0 0 0  Altered sleeping    0   Tired, decreased energy    0    Change in appetite    0   Feeling bad or failure about yourself     0   Trouble concentrating    0   Moving slowly or fidgety/restless    0   Suicidal thoughts    0   PHQ-9 Score    0   Difficult doing work/chores    Not difficult at all     HTN bp is stable today  No cp or palpitations or headaches or edema  No side effects to medicines  BP Readings from Last 3 Encounters:  11/28/23 126/80  07/05/23 130/77  03/29/23 131/78     Hctz 25 mg daily  Verapamil SR 120 mg daily Ziac 2.5-6.25 mg daily   Sees cardiology for a fib   Pulse Readings from Last 3 Encounters:  11/28/23 (!) 54  07/05/23 60  03/29/23 (!) 56     Subclinical hypothyroidism Lab Results  Component Value Date   TSH 2.58 11/22/2023   Normal free T4   Lab Results  Component Value Date   NA 143 11/22/2023   K 3.5 11/22/2023   CO2 36 (H) 11/22/2023   GLUCOSE 91 11/22/2023   BUN 19 11/22/2023   CREATININE 0.74 11/22/2023   CALCIUM 10.1 11/22/2023   GFR 75.54 11/22/2023   EGFR  66 07/06/2023   GFRNONAA >60 02/01/2019   Lab Results  Component Value Date   ALT 14 11/22/2023   AST 18 11/22/2023   ALKPHOS 57 11/22/2023   BILITOT 0.7 11/22/2023    DM screen Lab Results  Component Value Date   HGBA1C 5.8 11/22/2023    Hyperlipidemia Lab Results  Component Value Date   CHOL 219 (H) 11/22/2023   CHOL 244 (H) 11/10/2022   CHOL 260 (H) 10/18/2021   Lab Results  Component Value Date   HDL 54.30 11/22/2023   HDL 63.00 11/10/2022   HDL 60.00 10/18/2021   Lab Results  Component Value Date   LDLCALC 135 (H) 11/22/2023   LDLCALC 157 (H) 11/10/2022   LDLCALC 172 (H) 10/18/2021   Lab Results  Component Value Date   TRIG 145.0 11/22/2023   TRIG 119.0 11/10/2022   TRIG 141.0 10/18/2021   Lab Results  Component Value Date   CHOLHDL 4 11/22/2023   CHOLHDL 4 11/10/2022   CHOLHDL 4 10/18/2021   Lab Results  Component Value Date   LDLDIRECT 175.7 09/09/2013   LDLDIRECT 184.5  08/01/2012   LDLDIRECT 169.6 10/20/2011    Diet controlled Declines medication  LDL is down to 130s Really works on it   No red meat  Eats chicken and fish instead  Takes a garlic supplement   Lab Results  Component Value Date   WBC 3.7 (L) 11/22/2023   HGB 13.8 11/22/2023   HCT 41.2 11/22/2023   MCV 90.9 11/22/2023   PLT 240.0 11/22/2023     Patient Active Problem List   Diagnosis Date Noted   Prediabetes 11/19/2023   Colon cancer screening 10/26/2021   Paroxysmal atrial fibrillation (HCC) 05/15/2020   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   Obesity (BMI 30-39.9) 12/27/2016   Routine general medical examination at a health care facility 12/08/2015   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   Estrogen deficiency 10/31/2014   Vitamin D deficiency 10/31/2014   Encounter for Medicare annual wellness exam 09/13/2013   Screening mammogram, encounter for 04/19/2011   IRRITABLE BOWEL SYNDROME 01/22/2010   HYPERCHOLESTEROLEMIA 10/03/2007   Unspecified glaucoma 10/03/2007   Essential hypertension 10/03/2007   Allergic rhinitis 10/03/2007   Osteoarthritis 10/03/2007   Osteoporosis 10/03/2007   Past Medical History:  Diagnosis Date   Actinic keratosis    Allergy    Arthritis    Osteoarthritis   Basal cell carcinoma 12/06/2016   L ant lat neck anteriro   Basal cell carcinoma 10/20/2016   L ant lat neck posterior   Cancer (HCC)    skin   Glaucoma    Hypertension    Osteoporosis    Squamous cell carcinoma of skin 11/23/2017   L neck lat near angle of mandible (SCCIS)   Past Surgical History:  Procedure Laterality Date   BASAL CELL CARCINOMA EXCISION  12/2016   neck   EYE SURGERY     Bilateral cataract Surgery   SQUAMOUS CELL CARCINOMA EXCISION  12/2016   neck   TONSILLECTOMY     TOOTH EXTRACTION     Social History   Tobacco Use   Smoking status: Never   Smokeless tobacco: Never  Vaping Use   Vaping status: Never Used  Substance Use Topics   Alcohol use: No    Drug use: No   Family History  Problem Relation Age of Onset   Cancer Father        Kidney   Heart disease Father    Cancer Paternal Grandmother  Dementia Mother    Hypertension Mother    Hypertension Brother    Heart disease Maternal Uncle    Stroke Maternal Grandmother    Hypertension Maternal Grandmother    Heart disease Maternal Grandfather    Breast cancer Neg Hx    No Known Allergies Current Outpatient Medications on File Prior to Visit  Medication Sig Dispense Refill   acetaminophen (TYLENOL) 650 MG CR tablet Take 650 mg by mouth every 8 (eight) hours as needed. Alternates with Ibuprofen.     apixaban (ELIQUIS) 5 MG TABS tablet Take 1 tablet (5 mg total) by mouth 2 (two) times daily. 180 tablet 3   bisoprolol-hydrochlorothiazide (ZIAC) 2.5-6.25 MG tablet TAKE 1 TABLET EVERY DAY 90 tablet 0   calcium carbonate (OS-CAL) 600 MG TABS Take 600 mg by mouth daily.     chlorpheniramine (CHLOR-TRIMETON) 4 MG tablet Take 4 mg by mouth 2 (two) times daily as needed.     cholecalciferol (VITAMIN D) 1000 UNITS tablet Take 2,000 Units by mouth daily.     GARLIC PO Take 1 capsule by mouth daily.     hydrochlorothiazide (HYDRODIURIL) 25 MG tablet TAKE 1 TABLET EVERY DAY 90 tablet 0   levocetirizine (XYZAL) 5 MG tablet Take 5 mg by mouth daily.     Lifitegrast 5 % SOLN Place 1 drop into both eyes 2 (two) times a day.     Multiple Vitamins-Minerals (PRESERVISION AREDS 2) CAPS Take 1 capsule by mouth 2 (two) times a day.     phenylephrine (NEO-SYNEPHRINE) 0.125 % nasal drops Place 1 drop into the nose as needed.     Polyethyl Glycol-Propyl Glycol (SYSTANE OP) Apply to eye 2 (two) times daily as needed.     timolol (BETIMOL) 0.5 % ophthalmic solution Place 1 drop into both eyes daily.     verapamil (CALAN-SR) 120 MG CR tablet Take 1 tablet (120 mg total) by mouth at bedtime. 90 tablet 2   No current facility-administered medications on file prior to visit.    Review of Systems   Constitutional:  Negative for activity change, appetite change, fatigue, fever and unexpected weight change.  HENT:  Negative for congestion, ear pain, rhinorrhea, sinus pressure and sore throat.   Eyes:  Negative for pain, redness and visual disturbance.  Respiratory:  Negative for cough, shortness of breath and wheezing.   Cardiovascular:  Negative for chest pain and palpitations.  Gastrointestinal:  Negative for abdominal pain, blood in stool, constipation and diarrhea.  Endocrine: Negative for polydipsia and polyuria.  Genitourinary:  Negative for dysuria, frequency and urgency.  Musculoskeletal:  Negative for arthralgias, back pain and myalgias.  Skin:  Negative for pallor and rash.  Allergic/Immunologic: Negative for environmental allergies.  Neurological:  Negative for dizziness, syncope and headaches.  Hematological:  Negative for adenopathy. Does not bruise/bleed easily.  Psychiatric/Behavioral:  Negative for decreased concentration and dysphoric mood. The patient is not nervous/anxious.        Objective:   Physical Exam Constitutional:      General: She is not in acute distress.    Appearance: Normal appearance. She is well-developed. She is not ill-appearing or diaphoretic.  HENT:     Head: Normocephalic and atraumatic.     Right Ear: Tympanic membrane, ear canal and external ear normal.     Left Ear: Tympanic membrane, ear canal and external ear normal.     Nose: Nose normal. No congestion.     Mouth/Throat:     Mouth: Mucous membranes are moist.  Pharynx: Oropharynx is clear. No posterior oropharyngeal erythema.  Eyes:     General: No scleral icterus.    Extraocular Movements: Extraocular movements intact.     Conjunctiva/sclera: Conjunctivae normal.     Pupils: Pupils are equal, round, and reactive to light.  Neck:     Thyroid: No thyromegaly.     Vascular: No carotid bruit or JVD.  Cardiovascular:     Rate and Rhythm: Normal rate and regular rhythm.      Pulses: Normal pulses.     Heart sounds: Normal heart sounds.     No gallop.  Pulmonary:     Effort: Pulmonary effort is normal. No respiratory distress.     Breath sounds: Normal breath sounds. No wheezing.     Comments: Good air exch Chest:     Chest wall: No tenderness.  Abdominal:     General: Bowel sounds are normal. There is no distension or abdominal bruit.     Palpations: Abdomen is soft. There is no mass.     Tenderness: There is no abdominal tenderness.     Hernia: No hernia is present.  Genitourinary:    Comments: Breast exam: No mass, nodules, thickening, tenderness, bulging, retraction, inflamation, nipple discharge or skin changes noted.  No axillary or clavicular LA.     Musculoskeletal:        General: No tenderness. Normal range of motion.     Cervical back: Normal range of motion and neck supple. No rigidity. No muscular tenderness.     Right lower leg: No edema.     Left lower leg: No edema.     Comments: No kyphosis   Lymphadenopathy:     Cervical: No cervical adenopathy.  Skin:    General: Skin is warm and dry.     Coloration: Skin is not pale.     Findings: No erythema or rash.     Comments: Solar lentigines diffusely   Neurological:     Mental Status: She is alert. Mental status is at baseline.     Cranial Nerves: No cranial nerve deficit.     Motor: No abnormal muscle tone.     Coordination: Coordination normal.     Gait: Gait normal.     Deep Tendon Reflexes: Reflexes are normal and symmetric.  Psychiatric:        Mood and Affect: Mood normal.        Cognition and Memory: Cognition and memory normal.           Assessment & Plan:   Problem List Items Addressed This Visit       Cardiovascular and Mediastinum   Paroxysmal atrial fibrillation (HCC)   Under care of cardiology Stable rate Continue eliquis and ziac      Essential hypertension   bp in fair control at this time  BP Readings from Last 1 Encounters:  11/28/23 126/80   No  changes needed Most recent labs reviewed  Disc lifstyle change with low sodium diet and exercise  Plan to continue  Hctz 25 mg daily  Verapamil SR 120 mg daily Ziac 2.5-6.25 mg daily   Also sees cardiology        Endocrine   Subclinical hypothyroidism   TSH normal this check  Lab Results  Component Value Date   TSH 2.58 11/22/2023    No clinical changes         Musculoskeletal and Integument   Osteoporosis   Relevant Orders   DG Bone Density  Other   Vitamin D deficiency   Last vitamin D Lab Results  Component Value Date   VD25OH 42.49 11/22/2023   Vitamin D level is therapeutic with current supplementation Disc importance of this to bone and overall health       Screening mammogram, encounter for   Mammo ordered for May       Relevant Orders   MM 3D SCREENING MAMMOGRAM BILATERAL BREAST   Routine general medical examination at a health care facility - Primary   Reviewed health habits including diet and exercise and skin cancer prevention Reviewed appropriate screening tests for age  Also reviewed health mt list, fam hx and immunization status , as well as social and family history   See HPI Labs reviewed and ordered Health Maintenance  Topic Date Due   Zoster (Shingles) Vaccine (1 of 2) 02/28/2024*   DTaP/Tdap/Td vaccine (3 - Tdap) 11/27/2024*   COVID-19 Vaccine (4 - 2024-25 season) 12/13/2024*   Mammogram  01/25/2024   Medicare Annual Wellness Visit  11/20/2024   Pneumonia Vaccine  Completed   Flu Shot  Completed   DEXA scan (bone density measurement)  Completed   HPV Vaccine  Aged Out  *Topic was postponed. The date shown is not the original due date.   Cologuard neg 06/2022  Dexa and mammo ordered  Discussed fall prevention, supplements and exercise for bone density  Handout on fall prevention  PHQ 0        Prediabetes   Lab Results  Component Value Date   HGBA1C 5.8 11/22/2023   In prediabetes range  Handout given disc imp of  low glycemic diet and wt loss to prevent DM2       Obesity (BMI 30-39.9)   Discussed how this problem influences overall health and the risks it imposes  Reviewed plan for weight loss with lower calorie diet (via better food choices (lower glycemic and portion control) along with exercise building up to or more than 30 minutes 5 days per week including some aerobic activity and strength training         HYPERCHOLESTEROLEMIA   Disc goals for lipids and reasons to control them Rev last labs with pt Rev low sat fat diet in detail   LDL down to 130s  Declines medication  Wants to keep working on diet       Estrogen deficiency   Dexa ordered       Colon cancer screening   Cologuard nl 06/2022

## 2023-11-28 NOTE — Assessment & Plan Note (Signed)
 Disc goals for lipids and reasons to control them Rev last labs with pt Rev low sat fat diet in detail   LDL down to 130s  Declines medication  Wants to keep working on diet

## 2023-11-28 NOTE — Assessment & Plan Note (Signed)
 TSH normal this check  Lab Results  Component Value Date   TSH 2.58 11/22/2023    No clinical changes

## 2023-11-28 NOTE — Assessment & Plan Note (Signed)
Cologuard nl 06/2022

## 2023-11-28 NOTE — Assessment & Plan Note (Signed)
 Reviewed health habits including diet and exercise and skin cancer prevention Reviewed appropriate screening tests for age  Also reviewed health mt list, fam hx and immunization status , as well as social and family history   See HPI Labs reviewed and ordered Health Maintenance  Topic Date Due   Zoster (Shingles) Vaccine (1 of 2) 02/28/2024*   DTaP/Tdap/Td vaccine (3 - Tdap) 11/27/2024*   COVID-19 Vaccine (4 - 2024-25 season) 12/13/2024*   Mammogram  01/25/2024   Medicare Annual Wellness Visit  11/20/2024   Pneumonia Vaccine  Completed   Flu Shot  Completed   DEXA scan (bone density measurement)  Completed   HPV Vaccine  Aged Out  *Topic was postponed. The date shown is not the original due date.   Cologuard neg 06/2022  Dexa and mammo ordered  Discussed fall prevention, supplements and exercise for bone density  Handout on fall prevention  PHQ 0

## 2023-11-28 NOTE — Assessment & Plan Note (Signed)
 bp in fair control at this time  BP Readings from Last 1 Encounters:  11/28/23 126/80   No changes needed Most recent labs reviewed  Disc lifstyle change with low sodium diet and exercise  Plan to continue  Hctz 25 mg daily  Verapamil SR 120 mg daily Ziac 2.5-6.25 mg daily   Also sees cardiology

## 2023-11-28 NOTE — Assessment & Plan Note (Signed)
 Mammo ordered for May

## 2023-11-28 NOTE — Assessment & Plan Note (Signed)
Under care of cardiology °Stable rate °Continue eliquis and ziac °

## 2023-11-28 NOTE — Assessment & Plan Note (Signed)
 Lab Results  Component Value Date   HGBA1C 5.8 11/22/2023   In prediabetes range  Handout given disc imp of low glycemic diet and wt loss to prevent DM2

## 2023-11-28 NOTE — Assessment & Plan Note (Signed)
 Last vitamin D Lab Results  Component Value Date   VD25OH 42.49 11/22/2023   Vitamin D level is therapeutic with current supplementation Disc importance of this to bone and overall health

## 2023-11-28 NOTE — Patient Instructions (Addendum)
 If you want to update your tetanus shot- ask the pharmacist   Try to stay active Add some strength training to your routine, this is important for bone and brain health and can reduce your risk of falls and help your body use insulin properly and regulate weight  Light weights, exercise bands , and internet videos are a good way to start  Yoga (chair or regular), machines , floor exercises or a gym with machines are also good options   Your blood sugar is borderline high / prediabetic range  Try to get most of your carbohydrates from produce (with the exception of white potatoes) and whole grains Eat less bread/pasta/rice/snack foods/cereals/sweets and other items from the middle of the grocery store (processed carbs) Limit the sweets to special occasions   See dermatology as planned  Don't forget to use sunscreen    Call to schedule bone density test and mammogram for may   You have an order for:  []   2D Mammogram  [x]   3D Mammogram  [x]   Bone Density     Please call for appointment:   [x]   [facility]  [address] [place] [address]  [phone]  []   [facility])   [address]  [place], [place] [address]  [phone]  []   [facility]      [address] [place], [place]        [phone]         []   [facility]  [address] [place], [place]  [phone]  []  [facility]   [address]   [place], [place] [address]  [phone]  []  [facility]  [address] [place], [place] [address] [phone]    Make sure to wear two piece clothing  No lotions powders or deodorants the day of the appointment Make sure to bring picture ID and insurance card.  Bring list of medications you are currently taking  including any supplements.   Schedule your screening mammogram through MyChart!   Select [facility] imaging sites can now be scheduled through MyChart.  Log into your MyChart account.  Go to 'Visit' (or 'Appointments' if  on mobile App) --> Schedule an  Appointment  Under 'Select a Reason for Visit' choose the Mammogram  Screening option.  Complete the pre-visit questions  and select the time and place that  best fits your schedule

## 2023-11-29 ENCOUNTER — Ambulatory Visit: Payer: Medicare HMO | Admitting: Dermatology

## 2023-12-25 ENCOUNTER — Telehealth: Payer: Self-pay | Admitting: Pharmacy Technician

## 2023-12-25 ENCOUNTER — Telehealth: Payer: Self-pay | Admitting: Cardiology

## 2023-12-25 ENCOUNTER — Other Ambulatory Visit (HOSPITAL_COMMUNITY): Payer: Self-pay

## 2023-12-25 NOTE — Telephone Encounter (Signed)
 Pt c/o medication issue:  1. Name of Medication:   apixaban  (ELIQUIS ) 5 MG TABS tablet    2. How are you currently taking this medication (dosage and times per day)? As written  3. Are you having a reaction (difficulty breathing--STAT)? No   4. What is your medication issue? Pt states it is time to do her Pt assistance

## 2023-12-25 NOTE — Telephone Encounter (Signed)
 PAP: Patient assistance application for Eliquis through [employer] ([employer] has been mailed to pt's home address on file. Provider portion of application will be faxed to provider's office.

## 2024-01-01 ENCOUNTER — Ambulatory Visit: Admitting: Dermatology

## 2024-01-02 ENCOUNTER — Encounter: Payer: Self-pay | Admitting: Dermatology

## 2024-01-02 ENCOUNTER — Ambulatory Visit: Admitting: Dermatology

## 2024-01-02 DIAGNOSIS — D1801 Hemangioma of skin and subcutaneous tissue: Secondary | ICD-10-CM

## 2024-01-02 DIAGNOSIS — W908XXA Exposure to other nonionizing radiation, initial encounter: Secondary | ICD-10-CM

## 2024-01-02 DIAGNOSIS — Z86007 Personal history of in-situ neoplasm of skin: Secondary | ICD-10-CM

## 2024-01-02 DIAGNOSIS — L820 Inflamed seborrheic keratosis: Secondary | ICD-10-CM | POA: Diagnosis not present

## 2024-01-02 DIAGNOSIS — L578 Other skin changes due to chronic exposure to nonionizing radiation: Secondary | ICD-10-CM | POA: Diagnosis not present

## 2024-01-02 DIAGNOSIS — L570 Actinic keratosis: Secondary | ICD-10-CM | POA: Diagnosis not present

## 2024-01-02 DIAGNOSIS — Z8589 Personal history of malignant neoplasm of other organs and systems: Secondary | ICD-10-CM

## 2024-01-02 DIAGNOSIS — Z85828 Personal history of other malignant neoplasm of skin: Secondary | ICD-10-CM

## 2024-01-02 DIAGNOSIS — D229 Melanocytic nevi, unspecified: Secondary | ICD-10-CM

## 2024-01-02 DIAGNOSIS — L918 Other hypertrophic disorders of the skin: Secondary | ICD-10-CM | POA: Diagnosis not present

## 2024-01-02 DIAGNOSIS — Z7189 Other specified counseling: Secondary | ICD-10-CM

## 2024-01-02 DIAGNOSIS — L821 Other seborrheic keratosis: Secondary | ICD-10-CM | POA: Diagnosis not present

## 2024-01-02 DIAGNOSIS — Z79899 Other long term (current) drug therapy: Secondary | ICD-10-CM

## 2024-01-02 DIAGNOSIS — B351 Tinea unguium: Secondary | ICD-10-CM

## 2024-01-02 DIAGNOSIS — L814 Other melanin hyperpigmentation: Secondary | ICD-10-CM

## 2024-01-02 DIAGNOSIS — Z1283 Encounter for screening for malignant neoplasm of skin: Secondary | ICD-10-CM | POA: Diagnosis not present

## 2024-01-02 MED ORDER — TERBINAFINE HCL 250 MG PO TABS: 250.0000 mg | ORAL_TABLET | Freq: Every day | ORAL | 0 refills | Status: DC

## 2024-01-02 NOTE — Patient Instructions (Addendum)

## 2024-01-02 NOTE — Progress Notes (Signed)
 Follow-Up Visit   Subjective  Monica Guerrero is a 83 y.o. female who presents for the following: Skin Cancer Screening and Full Body Skin Exam hx of BCCs, SCC IS, Aks, Tinea Pedis/Unguium  R great toenail, 16m f/u, 11m of oral Lamisil , hx of Kerydin . Ciclopirox  solution and Ketoconazole  cream in past  The patient presents for Total-Body Skin Exam (TBSE) for skin cancer screening and mole check. The patient has spots, moles and lesions to be evaluated, some may be new or changing and the patient may have concern these could be cancer.  The following portions of the chart were reviewed this encounter and updated as appropriate: medications, allergies, medical history  Review of Systems:  No other skin or systemic complaints except as noted in HPI or Assessment and Plan.  Objective  Well appearing patient in no apparent distress; mood and affect are within normal limits.  A full examination was performed including scalp, head, eyes, ears, nose, lips, neck, chest, axillae, abdomen, back, buttocks, bilateral upper extremities, bilateral lower extremities, hands, feet, fingers, toes, fingernails, and toenails. All findings within normal limits unless otherwise noted below.   Relevant physical exam findings are noted in the Assessment and Plan.  L nasal bridge x 1 Pink scaly macules L wrist x 1 Stuck on waxy paps with erythema  Assessment & Plan   SKIN CANCER SCREENING PERFORMED TODAY.  ACTINIC DAMAGE - Chronic condition, secondary to cumulative UV/sun exposure - diffuse scaly erythematous macules with underlying dyspigmentation - Recommend daily broad spectrum sunscreen SPF 30+ to sun-exposed areas, reapply every 2 hours as needed.  - Staying in the shade or wearing long sleeves, sun glasses (UVA+UVB protection) and wide brim hats (4-inch brim around the entire circumference of the hat) are also recommended for sun protection.  - Call for new or changing lesions.  LENTIGINES, SEBORRHEIC  KERATOSES, HEMANGIOMAS - Benign normal skin lesions - Benign-appearing - Call for any changes  MELANOCYTIC NEVI - Tan-brown and/or pink-flesh-colored symmetric macules and papules - Benign appearing on exam today - Observation - Call clinic for new or changing moles - Recommend daily use of broad spectrum spf 30+ sunscreen to sun-exposed areas.   HISTORY OF BASAL CELL CARCINOMA OF THE SKIN - No evidence of recurrence today - Recommend regular full body skin exams - Recommend daily broad spectrum sunscreen SPF 30+ to sun-exposed areas, reapply every 2 hours as needed.  - Call if any new or changing lesions are noted between office visits  - L ant lat neck anterior, L ant lat neck posterior  HISTORY OF SQUAMOUS CELL CARCINOMA IN SITU OF THE SKIN - No evidence of recurrence today - Recommend regular full body skin exams - Recommend daily broad spectrum sunscreen SPF 30+ to sun-exposed areas, reapply every 2 hours as needed.  - Call if any new or changing lesions are noted between office visits  - L neck lat near angle of mandible  Acrochordons (Skin Tags) Neck, L axilla - Fleshy, skin-colored pedunculated papules - Benign appearing.  - Observe. - If desired, they can be removed with an in office procedure that is not covered by insurance. - Please call the clinic if you notice any new or changing lesions.   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 Condition is improving with treatment but not currently at goal. Treatment Plan: Restart Lamisil  250mg  1 po qd x 1 month for  a total of 4 months of treatment   AK (ACTINIC KERATOSIS) L nasal bridge x 1 Actinic keratoses are precancerous spots that appear secondary to cumulative UV radiation exposure/sun exposure over time. They are chronic with expected duration over 1 year. A portion of actinic keratoses will  progress to squamous cell carcinoma of the skin. It is not possible to reliably predict which spots will progress to skin cancer and so treatment is recommended to prevent development of skin cancer.  Recommend daily broad spectrum sunscreen SPF 30+ to sun-exposed areas, reapply every 2 hours as needed.  Recommend staying in the shade or wearing long sleeves, sun glasses (UVA+UVB protection) and wide brim hats (4-inch brim around the entire circumference of the hat). Call for new or changing lesions. Destruction of lesion - L nasal bridge x 1 Complexity: simple   Destruction method: cryotherapy   Informed consent: discussed and consent obtained   Timeout:  patient name, date of birth, surgical site, and procedure verified Lesion destroyed using liquid nitrogen: Yes   Region frozen until ice ball extended beyond lesion: Yes   Outcome: patient tolerated procedure well with no complications   Post-procedure details: wound care instructions given   INFLAMED SEBORRHEIC KERATOSIS L wrist x 1 Symptomatic, irritating, patient would like treated. Destruction of lesion - L wrist x 1 Complexity: simple   Destruction method: cryotherapy   Informed consent: discussed and consent obtained   Timeout:  patient name, date of birth, surgical site, and procedure verified Lesion destroyed using liquid nitrogen: Yes   Region frozen until ice ball extended beyond lesion: Yes   Outcome: patient tolerated procedure well with no complications   Post-procedure details: wound care instructions given   Return in about 1 year (around 01/01/2025) for TBSE, Hx of BCC, Hx of SCC, Hx of AKs.  I, Monica Guerrero, RMA, am acting as scribe for Monica Collard, MD .   Documentation: I have reviewed the above documentation for accuracy and completeness, and I agree with the above.  Monica Collard, MD

## 2024-01-03 NOTE — Telephone Encounter (Signed)
Lmom for pt to call me back. 

## 2024-01-04 NOTE — Telephone Encounter (Signed)
 Patient called me back and said she won't qualify until after she pays for this med one more time. She said she will come in for an appt at the end of the month and will give them the application then for this to be sent off then

## 2024-01-22 DIAGNOSIS — H35373 Puckering of macula, bilateral: Secondary | ICD-10-CM | POA: Diagnosis not present

## 2024-01-22 DIAGNOSIS — Z0100 Encounter for examination of eyes and vision without abnormal findings: Secondary | ICD-10-CM | POA: Diagnosis not present

## 2024-01-22 DIAGNOSIS — H353132 Nonexudative age-related macular degeneration, bilateral, intermediate dry stage: Secondary | ICD-10-CM | POA: Diagnosis not present

## 2024-01-22 DIAGNOSIS — H04123 Dry eye syndrome of bilateral lacrimal glands: Secondary | ICD-10-CM | POA: Diagnosis not present

## 2024-01-22 DIAGNOSIS — H401131 Primary open-angle glaucoma, bilateral, mild stage: Secondary | ICD-10-CM | POA: Diagnosis not present

## 2024-01-25 ENCOUNTER — Other Ambulatory Visit: Payer: Self-pay | Admitting: Family Medicine

## 2024-01-26 ENCOUNTER — Other Ambulatory Visit: Payer: Self-pay

## 2024-01-26 MED ORDER — VERAPAMIL HCL ER 120 MG PO TBCR: 120.0000 mg | EXTENDED_RELEASE_TABLET | Freq: Every day | ORAL | 0 refills | Status: DC

## 2024-01-29 DIAGNOSIS — I471 Supraventricular tachycardia, unspecified: Secondary | ICD-10-CM | POA: Insufficient documentation

## 2024-01-29 NOTE — Progress Notes (Deleted)
 Electrophysiology Clinic Note    Date:  01/29/2024  Patient ID:  Monica Guerrero, Monica Guerrero 1940-09-08, MRN [id number] PCP:  Clemens Curt, MD  Cardiologist:  Constancia Delton, MD Electrophysiologist: None  ***refresh  Discussed the use of AI scribe software for clinical note transcription with the patient, who gave verbal consent to proceed.   Patient Profile    Chief Complaint: Afib follow-up  History of Present Illness: Monica Guerrero is a 83 y.o. female with PMH notable for parox AFib, HTN, HLD ; seen today for None for routine electrophysiology followup.   I last saw her 06/2019 for for EP evaluation after she woke up in the early morning hours with palpitations and variable heart rate.  She had historically been asymptomatic of A-fib episodes, and so was very concerned by this episode.  The episode spontaneously stopped.  Given rare symptomatic A-fib episode, elected to continue to monitor burden.  Updated 2-week monitor showed rare SVT episodes, no A-fib.  On follow-up today, *** AF burden, symptoms *** palpitations *** bleeding concerns    Since last being seen in our clinic the patient reports doing ***.  she denies chest pain, palpitations, dyspnea, PND, orthopnea, nausea, vomiting, dizziness, syncope, edema, weight gain, or early satiety.      Arrhythmia/Device History No specialty comments available.     ROS:  Please see the history of present illness. All other systems are reviewed and otherwise negative.    Physical Exam    VS:  There were no vitals taken for this visit. BMI: There is no height or weight on file to calculate BMI.  Wt Readings from Last 3 Encounters:  11/28/23 175 lb 2 oz (79.4 kg)  11/21/23 175 lb (79.4 kg)  07/05/23 175 lb 9.6 oz (79.7 kg)     GEN- The patient is well appearing, alert and oriented x 3 today.   Lungs- Clear to ausculation bilaterally, normal work of breathing.  Heart- {Blank single:19197::"Regular","Irregularly  irregular"} rate and rhythm, no murmurs, rubs or gallops Extremities- {EDEMA LEVEL:28147::"No"} peripheral edema, warm, dry    Studies Reviewed   Previous EP, cardiology notes.    EKG is ordered. Personal review of EKG from today shows:  ***        Long term monitor, 07/27/2023 HR 43 - 141, average 61 bpm. 17 nonsustained SVT, longest 13.9 seconds with an average rate of 113 bpm. Rhythm strip suggests AT. Rare supraventricular and ventricular ectopy. No sustained arrhythmias. No atrial fibrillation.  TTE, 10/09/2019  1. Left ventricular ejection fraction, by visual estimation, is 60 to 65%. The left ventricle has normal function. There is mildly increased left ventricular hypertrophy.  2. Elevated left atrial pressure.  3. Left ventricular diastolic parameters are consistent with Grade Idiastolic dysfunction (impaired relaxation).  4. The left ventricle has no regional wall motion abnormalities.  5. Global right ventricle has normal systolic function.The right ventricular size is normal. Mildly increased right ventricular wall thickness.  6. Left atrial size was elongated.  7. Right atrial size was mildly dilated.  8. The mitral valve is grossly normal. Trivial mitral valve regurgitation. No evidence of mitral stenosis.  9. The tricuspid valve is not well visualized. 10. The tricuspid valve is not well visualized. Tricuspid valve regurgitation is mild. 11. The aortic valve was not well visualized. Aortic valve regurgitation is not visualized. No evidence of aortic valve sclerosis or stenosis. 12. The pulmonic valve was not well visualized. Pulmonic valve regurgitation is not visualized. 13.  Borderline aortic dilatation noted. 14. Mildly elevated pulmonary artery systolic pressure. 15. The inferior vena cava is normal in size with greater than 50% respiratory variability, suggesting right atrial pressure of 3 mmHg.   Long term monitor, 08/18/2019 Patient had a min HR of 41 bpm,  max HR of 176 bpm, and avg HR of 61 bpm. Predominant underlying rhythm was Sinus Rhythm. 3191 Supraventricular Tachycardia runs occurred, the run with the fastest interval lasting 1 min 51 secs with a max rate of 176 bpm, the longest lasting 2 mins 52 secs with an avg rate of 149 bpm. Atrial Fibrillation occurred (5% burden), ranging from 57-146 bpm (avg of 87 bpm), the longest lasting 26 mins 10 secs with an avg rate of 91 bpm. Supraventricular Tachycardia and Atrial Fibrillation were detected within +/- 45 seconds of symptomatic patient event(s).   Assessment and Plan     #) parox Afib #) SVT   #) Hypercoag d/t parox afib CHA2DS2-VASc Score = at least 4 [CHF History: 0, HTN History: 1, Diabetes History: 0, Stroke History: 0, Vascular Disease History: 0, Age Score: 2, Gender Score: 1].  Therefore, the patient's annual risk of stroke is 4.8 %.     {Confirm score is correct.  If not, click here to update score.  REFRESH note.  :1}   Stroke ppx - ***, appropriately dosed No bleeding concerns    #) ***   {Are you ordering a CV Procedure (e.g. stress test, cath, DCCV, TEE, etc)?   Press F2        :[id number]}   Current medicines are reviewed at length with the patient today.   The patient {ACTIONS; HAS/DOES NOT HAVE:19233} concerns regarding her medicines.  The following changes were made today:  {NONE DEFAULTED:18576}  Labs/ tests ordered today include: *** No orders of the defined types were placed in this encounter.    Disposition: Follow up with {EPMDS:28135} or EP APP {EPFOLLOW UP:28173}   Signed, Adaline Holly, NP  01/29/24  2:26 PM  Electrophysiology [facility]

## 2024-01-30 ENCOUNTER — Ambulatory Visit: Payer: Medicare HMO | Admitting: Cardiology

## 2024-01-30 DIAGNOSIS — I471 Supraventricular tachycardia, unspecified: Secondary | ICD-10-CM

## 2024-01-30 DIAGNOSIS — I480 Paroxysmal atrial fibrillation: Secondary | ICD-10-CM

## 2024-02-06 ENCOUNTER — Encounter: Payer: Self-pay | Admitting: Cardiology

## 2024-02-06 NOTE — Progress Notes (Unsigned)
 Electrophysiology Clinic Note    Date:  02/07/2024  Patient ID:  Monica Guerrero, Monica Guerrero 26-Jan-1941, MRN [id number] PCP:  Clemens Curt, MD  Cardiologist:  Constancia Delton, MD Electrophysiologist: None    Discussed the use of AI scribe software for clinical note transcription with the patient, who gave verbal consent to proceed.   Patient Profile    Chief Complaint: Afib follow-up  History of Present Illness: Monica Guerrero is a 83 y.o. female with PMH notable for parox AFib, SVT, HTN, HLD ; seen today for None for routine electrophysiology followup.   I last saw her 06/2023 for EP evaluation after she woke up in the early morning hours with palpitations and variable heart rate.  She had historically been asymptomatic of A-fib episodes, and so was very concerned by this episode.  The episode spontaneously stopped.  Given rare symptomatic A-fib episode, elected to continue to monitor burden.  Updated 2-week monitor showed rare SVT episodes, no A-fib.  On follow-up today, she experiences rare palpitations described as 'flip flopping' or 'fluttering', lasting less than thirty seconds to a couple of minutes. These episodes occur rarely and have not been noticed in the last few weeks. When they occur, they do not impact her life at all.   She is currently taking Eliquis  in the morning and evening without any bleeding issues, such as blood in her urine, stool, or epistaxis.  She denies chest pain, chest pressure, shortness of breath.      Arrhythmia/Device History No specialty comments available.     ROS:  Please see the history of present illness. All other systems are reviewed and otherwise negative.    Physical Exam    VS:  BP (!) 142/82 (BP Location: Right Arm, Patient Position: Sitting)   Pulse 63   Ht 5\' 2"  (1.575 m)   Wt 172 lb 9.6 oz (78.3 kg)   SpO2 97%   BMI 31.57 kg/m  BMI: Body mass index is 31.57 kg/m.  Wt Readings from Last 3 Encounters:  02/07/24 172 lb 9.6  oz (78.3 kg)  11/28/23 175 lb 2 oz (79.4 kg)  11/21/23 175 lb (79.4 kg)     GEN- The patient is well appearing, alert and oriented x 3 today.   Lungs- Clear to ausculation bilaterally, normal work of breathing.  Heart- Regular rate and rhythm, no murmurs, rubs or gallops Extremities- No peripheral edema, warm, dry    Studies Reviewed   Previous EP, cardiology notes.    EKG is ordered. Personal review of EKG from today shows:    EKG Interpretation Date/Time:  Wednesday February 07 2024 13:44:09 EDT Ventricular Rate:  63 PR Interval:  146 QRS Duration:  74 QT Interval:  426 QTC Calculation: 435 R Axis:   4  Text Interpretation: Sinus rhythm with Premature atrial complexes When compared with ECG of 05-Jul-2023 13:14, Premature atrial complexes are now Present Confirmed by Tabithia Stroder [phone]) on 02/07/2024 1:48:31 PM    Long term monitor, 07/27/2023 HR 43 - 141, average 61 bpm. 17 nonsustained SVT, longest 13.9 seconds with an average rate of 113 bpm. Rhythm strip suggests AT. Rare supraventricular and ventricular ectopy. No sustained arrhythmias. No atrial fibrillation.  TTE, 10/09/2019  1. Left ventricular ejection fraction, by visual estimation, is 60 to 65%. The left ventricle has normal function. There is mildly increased left ventricular hypertrophy.  2. Elevated left atrial pressure.  3. Left ventricular diastolic parameters are consistent with Grade Idiastolic dysfunction (impaired  relaxation).  4. The left ventricle has no regional wall motion abnormalities.  5. Global right ventricle has normal systolic function.The right ventricular size is normal. Mildly increased right ventricular wall thickness.  6. Left atrial size was elongated.  7. Right atrial size was mildly dilated.  8. The mitral valve is grossly normal. Trivial mitral valve regurgitation. No evidence of mitral stenosis.  9. The tricuspid valve is not well visualized. 10. The tricuspid valve is not well  visualized. Tricuspid valve regurgitation is mild. 11. The aortic valve was not well visualized. Aortic valve regurgitation is not visualized. No evidence of aortic valve sclerosis or stenosis. 12. The pulmonic valve was not well visualized. Pulmonic valve regurgitation is not visualized. 13. Borderline aortic dilatation noted. 14. Mildly elevated pulmonary artery systolic pressure. 15. The inferior vena cava is normal in size with greater than 50% respiratory variability, suggesting right atrial pressure of 3 mmHg.   Long term monitor, 08/18/2019 Patient had a min HR of 41 bpm, max HR of 176 bpm, and avg HR of 61 bpm. Predominant underlying rhythm was Sinus Rhythm. 3191 Supraventricular Tachycardia runs occurred, the run with the fastest interval lasting 1 min 51 secs with a max rate of 176 bpm, the longest lasting 2 mins 52 secs with an avg rate of 149 bpm. Atrial Fibrillation occurred (5% burden), ranging from 57-146 bpm (avg of 87 bpm), the longest lasting 26 mins 10 secs with an avg rate of 91 bpm. Supraventricular Tachycardia and Atrial Fibrillation were detected within +/- 45 seconds of symptomatic patient event(s).   Assessment and Plan     #) parox Afib #) SVT Very low symptomatic burden Continue 2.5mg  bisoprolol  (as part of combo pill along w 6.25HCTZ)   #) Hypercoag d/t parox afib CHA2DS2-VASc Score = at least 4 [CHF History: 0, HTN History: 1, Diabetes History: 0, Stroke History: 0, Vascular Disease History: 0, Age Score: 2, Gender Score: 1].  Therefore, the patient's annual risk of stroke is 4.8 %.    Stroke ppx - 5mg  eliquis , appropriately dosed No bleeding concerns          Current medicines are reviewed at length with the patient today.   The patient does not have concerns regarding her medicines.  The following changes were made today:  none  Labs/ tests ordered today include:  Orders Placed This Encounter  Procedures   EKG 12-Lead     Disposition: Follow  up with EP Team  PRN   Follow-up with Dr. Junnie Olives in 6mon.    Signed, Marli Diego, NP  02/07/24  4:23 PM  Electrophysiology [facility]

## 2024-02-07 ENCOUNTER — Ambulatory Visit: Attending: Cardiology | Admitting: Cardiology

## 2024-02-07 VITALS — BP 142/82 | HR 63 | Ht 62.0 in | Wt 172.6 lb

## 2024-02-07 DIAGNOSIS — I471 Supraventricular tachycardia, unspecified: Secondary | ICD-10-CM

## 2024-02-07 DIAGNOSIS — D6869 Other thrombophilia: Secondary | ICD-10-CM

## 2024-02-07 DIAGNOSIS — I480 Paroxysmal atrial fibrillation: Secondary | ICD-10-CM

## 2024-02-07 NOTE — Patient Instructions (Signed)
 Medication Instructions:   *If you need a refill on your cardiac medications before your next appointment, please call your pharmacy*    Follow-Up: At [facility], you and your health needs are our priority.  As part of our continuing mission to provide you with exceptional heart care, our providers are all part of one team.  This team includes your primary Cardiologist (physician) and Advanced Practice Providers or APPs (Physician Assistants and Nurse Practitioners) who all work together to provide you with the care you need, when you need it.  Your next appointment:   6 month(s)  Provider:   Constancia Delton, MD    We recommend signing up for the patient portal called "MyChart".  Sign up information is provided on this After Visit Summary.  MyChart is used to connect with patients for Virtual Visits (Telemedicine).  Patients are able to view lab/test results, encounter notes, upcoming appointments, etc.  Non-urgent messages can be sent to your provider as well.   To learn more about what you can do with MyChart, go to [email or web address].

## 2024-02-08 NOTE — Telephone Encounter (Signed)
 Received notification from julie that patient needed a call back about her application. I called her and she was asking if her investment accounts needed to be added to the income. It does. Patient said she will qualify next month then. She said she will send application in then.   I asked her how much eliquis  she has -she said she has enough eliquis  until July

## 2024-02-10 ENCOUNTER — Other Ambulatory Visit: Payer: Self-pay | Admitting: Family Medicine

## 2024-02-29 ENCOUNTER — Other Ambulatory Visit (HOSPITAL_COMMUNITY): Payer: Self-pay

## 2024-03-21 ENCOUNTER — Ambulatory Visit
Admission: RE | Admit: 2024-03-21 | Discharge: 2024-03-21 | Disposition: A | Discharge status: Home / Self Care | Source: Ambulatory Visit | Attending: Family Medicine | Admitting: Family Medicine

## 2024-03-21 DIAGNOSIS — M810 Age-related osteoporosis without current pathological fracture: Secondary | ICD-10-CM | POA: Insufficient documentation

## 2024-03-21 DIAGNOSIS — Z780 Asymptomatic menopausal state: Secondary | ICD-10-CM | POA: Diagnosis not present

## 2024-03-21 DIAGNOSIS — Z1231 Encounter for screening mammogram for malignant neoplasm of breast: Secondary | ICD-10-CM | POA: Diagnosis not present

## 2024-03-22 ENCOUNTER — Ambulatory Visit: Payer: Self-pay | Admitting: Family Medicine

## 2024-04-16 ENCOUNTER — Other Ambulatory Visit: Payer: Self-pay | Admitting: Cardiology

## 2024-04-16 NOTE — Telephone Encounter (Signed)
 Please contact pt for future appointment. Pt due for 12 month f/u.

## 2024-06-05 ENCOUNTER — Ambulatory Visit: Admitting: Cardiology

## 2024-06-17 DIAGNOSIS — R32 Unspecified urinary incontinence: Secondary | ICD-10-CM | POA: Diagnosis not present

## 2024-06-17 DIAGNOSIS — I4891 Unspecified atrial fibrillation: Secondary | ICD-10-CM | POA: Diagnosis not present

## 2024-06-17 DIAGNOSIS — Z6830 Body mass index (BMI) 30.0-30.9, adult: Secondary | ICD-10-CM | POA: Diagnosis not present

## 2024-06-17 DIAGNOSIS — E785 Hyperlipidemia, unspecified: Secondary | ICD-10-CM | POA: Diagnosis not present

## 2024-06-17 DIAGNOSIS — I471 Supraventricular tachycardia, unspecified: Secondary | ICD-10-CM | POA: Diagnosis not present

## 2024-06-17 DIAGNOSIS — R7303 Prediabetes: Secondary | ICD-10-CM | POA: Diagnosis not present

## 2024-06-17 DIAGNOSIS — N182 Chronic kidney disease, stage 2 (mild): Secondary | ICD-10-CM | POA: Diagnosis not present

## 2024-06-17 DIAGNOSIS — Z85828 Personal history of other malignant neoplasm of skin: Secondary | ICD-10-CM | POA: Diagnosis not present

## 2024-06-17 DIAGNOSIS — I129 Hypertensive chronic kidney disease with stage 1 through stage 4 chronic kidney disease, or unspecified chronic kidney disease: Secondary | ICD-10-CM | POA: Diagnosis not present

## 2024-06-17 DIAGNOSIS — Z7901 Long term (current) use of anticoagulants: Secondary | ICD-10-CM | POA: Diagnosis not present

## 2024-06-17 DIAGNOSIS — H409 Unspecified glaucoma: Secondary | ICD-10-CM | POA: Diagnosis not present

## 2024-06-17 DIAGNOSIS — H3530 Unspecified macular degeneration: Secondary | ICD-10-CM | POA: Diagnosis not present

## 2024-06-17 DIAGNOSIS — E669 Obesity, unspecified: Secondary | ICD-10-CM | POA: Diagnosis not present

## 2024-06-17 DIAGNOSIS — I739 Peripheral vascular disease, unspecified: Secondary | ICD-10-CM | POA: Diagnosis not present

## 2024-06-17 DIAGNOSIS — M810 Age-related osteoporosis without current pathological fracture: Secondary | ICD-10-CM | POA: Diagnosis not present

## 2024-06-17 DIAGNOSIS — D6869 Other thrombophilia: Secondary | ICD-10-CM | POA: Diagnosis not present

## 2024-06-17 DIAGNOSIS — Z7982 Long term (current) use of aspirin: Secondary | ICD-10-CM | POA: Diagnosis not present

## 2024-06-17 DIAGNOSIS — Z818 Family history of other mental and behavioral disorders: Secondary | ICD-10-CM | POA: Diagnosis not present

## 2024-07-11 ENCOUNTER — Other Ambulatory Visit: Payer: Self-pay | Admitting: Cardiology

## 2024-07-30 DIAGNOSIS — H35373 Puckering of macula, bilateral: Secondary | ICD-10-CM | POA: Diagnosis not present

## 2024-07-30 DIAGNOSIS — H401131 Primary open-angle glaucoma, bilateral, mild stage: Secondary | ICD-10-CM | POA: Diagnosis not present

## 2024-07-30 DIAGNOSIS — H04123 Dry eye syndrome of bilateral lacrimal glands: Secondary | ICD-10-CM | POA: Diagnosis not present

## 2024-07-30 DIAGNOSIS — H353132 Nonexudative age-related macular degeneration, bilateral, intermediate dry stage: Secondary | ICD-10-CM | POA: Diagnosis not present

## 2024-08-07 ENCOUNTER — Encounter: Payer: Self-pay | Admitting: Cardiology

## 2024-08-07 ENCOUNTER — Ambulatory Visit: Admitting: Cardiology

## 2024-08-07 ENCOUNTER — Ambulatory Visit: Attending: Cardiology | Admitting: Cardiology

## 2024-08-07 ENCOUNTER — Telehealth: Admitting: Family Medicine

## 2024-08-07 VITALS — BP 138/72 | HR 53 | Ht 63.0 in | Wt 168.8 lb

## 2024-08-07 DIAGNOSIS — I10 Essential (primary) hypertension: Secondary | ICD-10-CM

## 2024-08-07 DIAGNOSIS — I480 Paroxysmal atrial fibrillation: Secondary | ICD-10-CM | POA: Diagnosis not present

## 2024-08-07 DIAGNOSIS — R3989 Other symptoms and signs involving the genitourinary system: Secondary | ICD-10-CM

## 2024-08-07 NOTE — Progress Notes (Signed)
 Cardiology Office Note:    Date:  08/07/2024   ID:  Monica Guerrero, DOB [birth year], MRN [id number]  PCP:  Randeen Laine LABOR, MD  Cardiologist:  Redell Cave, MD  Electrophysiologist:  None   Referring MD: Randeen Laine LABOR, MD   Chief Complaint  Patient presents with   Follow-up    6 month follow up pt has been doing well with no complaints of chest pain, chest pressure or SOB, medciation reviewed verbally with patient    History of Present Illness:    Monica Guerrero is a 83 y.o. female with a hx of paroxysmal atrial fibrillation, hyperlipidemia, hypertension, who presents for follow-up.  Feels well, denies chest pain or shortness of breath.  States having rare palpitations.  Denies any bleeding issues with taking Eliquis .  Compliant medications as prescribed.   Prior notes Echocardiogram 10/2019 EF 60 to 65% Cardiac monitor 09/2019 SVT and atrial fibrillation noted  Past Medical History:  Diagnosis Date   Actinic keratosis    Allergy    Arthritis    Osteoarthritis   Atrial fibrillation (HCC)    Basal cell carcinoma 12/06/2016   L ant lat neck anteriro   Basal cell carcinoma 10/20/2016   L ant lat neck posterior   Cancer (HCC)    skin   Glaucoma    Hypertension    Osteoporosis    Squamous cell carcinoma of skin 11/23/2017   L neck lat near angle of mandible (SCCIS)   SVT (supraventricular tachycardia)     Past Surgical History:  Procedure Laterality Date   BASAL CELL CARCINOMA EXCISION  12/2016   neck   EYE SURGERY     Bilateral cataract Surgery   SQUAMOUS CELL CARCINOMA EXCISION  12/2016   neck   TONSILLECTOMY     TOOTH EXTRACTION      Current Medications: Current Meds  Medication Sig   acetaminophen (TYLENOL) 650 MG CR tablet Take 650 mg by mouth every 8 (eight) hours as needed. Alternates with Ibuprofen.   apixaban  (ELIQUIS ) 5 MG TABS tablet Take 1 tablet (5 mg total) by mouth 2 (two) times daily.   bisoprolol -hydrochlorothiazide  (ZIAC ) 2.5-6.25 MG  tablet TAKE 1 TABLET EVERY DAY   calcium carbonate (OS-CAL) 600 MG TABS Take 600 mg by mouth daily.   chlorpheniramine (CHLOR-TRIMETON) 4 MG tablet Take 4 mg by mouth 2 (two) times daily as needed.   cholecalciferol (VITAMIN D ) 1000 UNITS tablet Take 2,000 Units by mouth daily.   GARLIC PO Take 1 capsule by mouth daily.   hydrochlorothiazide  (HYDRODIURIL ) 25 MG tablet TAKE 1 TABLET EVERY DAY   levocetirizine (XYZAL) 5 MG tablet Take 5 mg by mouth daily.   Lifitegrast 5 % SOLN Place 1 drop into both eyes 2 (two) times a day.   Multiple Vitamins-Minerals (PRESERVISION AREDS 2) CAPS Take 1 capsule by mouth 2 (two) times a day.   phenylephrine (NEO-SYNEPHRINE) 0.125 % nasal drops Place 1 drop into the nose as needed.   Polyethyl Glycol-Propyl Glycol (SYSTANE OP) Apply to eye 2 (two) times daily as needed.   timolol (BETIMOL) 0.5 % ophthalmic solution Place 1 drop into both eyes daily.   verapamil  (CALAN -SR) 120 MG CR tablet TAKE 1 TABLET AT BEDTIME     Allergies:   Patient has no known allergies.   Social History   Socioeconomic History   Marital status: Single    Spouse name: Not on file   Number of children: 2   Years of education: Not on  file   Highest education level: Associate degree: occupational, scientist, product/process development, or vocational program  Occupational History   Occupation: Haematologist  Tobacco Use   Smoking status: Never   Smokeless tobacco: Never  Vaping Use   Vaping status: Never Used  Substance and Sexual Activity   Alcohol use: No   Drug use: No   Sexual activity: Not Currently  Other Topics Concern   Not on file  Social History Narrative   Not on file   Social Drivers of Health   Financial Resource Strain: Low Risk  (11/24/2023)   Overall Financial Resource Strain (CARDIA)    Difficulty of Paying Living Expenses: Not hard at all  Food Insecurity: No Food Insecurity (11/24/2023)   Hunger Vital Sign    Worried About Running Out of Food in the Last Year: Never true    Ran  Out of Food in the Last Year: Never true  Transportation Needs: No Transportation Needs (11/24/2023)   PRAPARE - Administrator, Civil Service (Medical): No    Lack of Transportation (Non-Medical): No  Physical Activity: Inactive (11/24/2023)   Exercise Vital Sign    Days of Exercise per Week: 0 days    Minutes of Exercise per Session: 30 min  Stress: No Stress Concern Present (11/24/2023)   [employer] of Occupational Health - Occupational Stress Questionnaire    Feeling of Stress : Not at all  Social Connections: Moderately Integrated (11/24/2023)   Social Connection and Isolation Panel    Frequency of Communication with Friends and Family: More than three times a week    Frequency of Social Gatherings with Friends and Family: More than three times a week    Attends Religious Services: More than 4 times per year    Active Member of [employer] or Organizations: Yes    Attends Engineer, Structural: More than 4 times per year    Marital Status: Divorced     Family History: The patient's family history includes Cancer in her father and paternal grandmother; Dementia in her mother; Heart disease in her father, maternal grandfather, and maternal uncle; Hypertension in her brother, maternal grandmother, and mother; Stroke in her maternal grandmother. There is no history of Breast cancer.  ROS:   Please see the history of present illness.     All other systems reviewed and are negative.  EKGs/Labs/Other Studies Reviewed:     EKG Interpretation Date/Time:  Wednesday August 07 2024 11:41:32 EST Ventricular Rate:  52 PR Interval:  150 QRS Duration:  74 QT Interval:  434 QTC Calculation: 403 R Axis:   0  Text Interpretation: Sinus bradycardia Confirmed by Darliss Rogue ([id number]) on 08/07/2024 11:56:25 AM    Recent Labs: 11/22/2023: ALT 14; BUN 19; Creatinine, Ser 0.74; Hemoglobin 13.8; Platelets 240.0; Potassium 3.5; Sodium 143; TSH 2.58  Recent Lipid Panel     Component Value Date/Time   CHOL 219 (H) 11/22/2023 0903   TRIG 145.0 11/22/2023 0903   HDL 54.30 11/22/2023 0903   CHOLHDL 4 11/22/2023 0903   VLDL 29.0 11/22/2023 0903   LDLCALC 135 (H) 11/22/2023 0903   LDLDIRECT 175.7 09/09/2013 0934    Physical Exam:    VS:  BP 138/72 (BP Location: Right Arm, Patient Position: Sitting)   Pulse (!) 53   Ht 5' 3 (1.6 m)   Wt 168 lb 12.8 oz (76.6 kg)   SpO2 99%   BMI 29.90 kg/m     Wt Readings from Last 3 Encounters:  08/07/24  168 lb 12.8 oz (76.6 kg)  02/07/24 172 lb 9.6 oz (78.3 kg)  11/28/23 175 lb 2 oz (79.4 kg)     GEN:  Well nourished, well developed in no acute distress HEENT: Normal NECK: No JVD; No carotid bruits CARDIAC: RRR, no murmurs, rubs, gallops RESPIRATORY:  Clear to auscultation without rales, wheezing or rhonchi  ABDOMEN: Soft, non-tender, non-distended MUSCULOSKELETAL:  No edema; No deformity  SKIN: Warm and dry NEUROLOGIC:  Alert and oriented x 3 PSYCHIATRIC:  Normal affect   ASSESSMENT:    1. Paroxysmal atrial fibrillation (HCC)   2. Primary hypertension    PLAN:    In order of problems listed above:  paroxysmal atrial fibrillation.  CHA2DS2-VASc score of 4 (age, htn, gender).  EKG today showing sinus bradycardia, heart rate 52.  Continue Eliquis  5 mg twice daily, verapamil  CR 120 mg daily. hypertension, BP controlled ,  continue verapamil  120 mg, HCTZ 25 mg daily.  Follow-up in 1 year.  Medication Adjustments/Labs and Tests Ordered: Current medicines are reviewed at length with the patient today.  Concerns regarding medicines are outlined above.  Orders Placed This Encounter  Procedures   EKG 12-Lead   No orders of the defined types were placed in this encounter.   Patient Instructions  Medication Instructions:  Your physician recommends that you continue on your current medications as directed. Please refer to the Current Medication list given to you today.   *If you need a refill on your  cardiac medications before your next appointment, please call your pharmacy*  Lab Work: No labs ordered today  If you have labs (blood work) drawn today and your tests are completely normal, you will receive your results only by: MyChart Message (if you have MyChart) OR A paper copy in the mail If you have any lab test that is abnormal or we need to change your treatment, we will call you to review the results.  Testing/Procedures: No test ordered today   Follow-Up: At [facility], you and your health needs are our priority.  As part of our continuing mission to provide you with exceptional heart care, our providers are all part of one team.  This team includes your primary Cardiologist (physician) and Advanced Practice Providers or APPs (Physician Assistants and Nurse Practitioners) who all work together to provide you with the care you need, when you need it.  Your next appointment:   1 year(s)  Provider:   You may see Redell Cave, MD or one of the following Advanced Practice Providers on your designated Care Team:   Lonni Meager, NP Lesley Maffucci, PA-C Bernardino Bring, PA-C Cadence Spry, PA-C Tylene Lunch, NP Barnie Hila, NP    We recommend signing up for the patient portal called MyChart.  Sign up information is provided on this After Visit Summary.  MyChart is used to connect with patients for Virtual Visits (Telemedicine).  Patients are able to view lab/test results, encounter notes, upcoming appointments, etc.  Non-urgent messages can be sent to your provider as well.   To learn more about what you can do with MyChart, go to [email or web address].         Signed, Redell Cave, MD  08/07/2024 12:53 PM    [facility]

## 2024-08-07 NOTE — Patient Instructions (Signed)

## 2024-08-07 NOTE — Progress Notes (Signed)
  Because there is a higher complication rate in UTI's over the age of 27, it is best to have you seen in person to get a urine sample prior to starting medications. As some of those complications are resistance to some meds and higher risk of kidney infection. We feel your condition warrants further evaluation and we recommend that you be seen in a face-to-face visit.   NOTE: There will be NO CHARGE for this E-Visit   If you are having a true medical emergency, please call 911.

## 2024-08-08 ENCOUNTER — Ambulatory Visit
Admission: EM | Admit: 2024-08-08 | Discharge: 2024-08-08 | Disposition: A | Discharge status: Home / Self Care | Attending: Emergency Medicine | Admitting: Emergency Medicine

## 2024-08-08 ENCOUNTER — Ambulatory Visit

## 2024-08-08 DIAGNOSIS — R300 Dysuria: Secondary | ICD-10-CM | POA: Diagnosis not present

## 2024-08-08 LAB — POCT URINE DIPSTICK
Bilirubin, UA: NEGATIVE
Glucose, UA: NEGATIVE mg/dL
Ketones, POC UA: NEGATIVE mg/dL
Nitrite, UA: NEGATIVE
POC PROTEIN,UA: NEGATIVE
Spec Grav, UA: 1.015 (ref 1.010–1.025)
Urobilinogen, UA: 0.2 U/dL
pH, UA: 7 (ref 5.0–8.0)

## 2024-08-08 MED ORDER — CEPHALEXIN 500 MG PO CAPS: 500.0000 mg | ORAL_CAPSULE | Freq: Three times a day (TID) | ORAL | 0 refills | Status: AC

## 2024-08-08 NOTE — ED Provider Notes (Signed)
 Monica Guerrero    CSN: [id number] Arrival date & time: 08/08/24  9146      History   Chief Complaint Chief Complaint  Patient presents with   Urinary Frequency    HPI Monica Guerrero is a 83 y.o. female.  Patient presents with 3-day history of dysuria, urinary frequency, bladder pressure.  No treatments at home.  No fever, abdominal pain, flank pain, hematuria.  Patient had an e-visit yesterday; diagnosed with suspected UTI; was instructed to be seen in person.  The history is provided by the patient and medical records.    Past Medical History:  Diagnosis Date   Actinic keratosis    Allergy    Arthritis    Osteoarthritis   Atrial fibrillation (HCC)    Basal cell carcinoma 12/06/2016   L ant lat neck anteriro   Basal cell carcinoma 10/20/2016   L ant lat neck posterior   Cancer (HCC)    skin   Glaucoma    Hypertension    Osteoporosis    Squamous cell carcinoma of skin 11/23/2017   L neck lat near angle of mandible (SCCIS)   SVT (supraventricular tachycardia)     Patient Active Problem List   Diagnosis Date Noted   SVT (supraventricular tachycardia) 01/29/2024   Prediabetes 11/19/2023   Colon cancer screening 10/26/2021   Paroxysmal atrial fibrillation (HCC) 05/15/2020   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   Obesity (BMI 30-39.9) 12/27/2016   Routine general medical examination at a health care facility 12/08/2015   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   Estrogen deficiency 10/31/2014   Vitamin D  deficiency 10/31/2014   Encounter for Medicare annual wellness exam 09/13/2013   Screening mammogram, encounter for 04/19/2011   IRRITABLE BOWEL SYNDROME 01/22/2010   HYPERCHOLESTEROLEMIA 10/03/2007   Unspecified glaucoma 10/03/2007   Essential hypertension 10/03/2007   Allergic rhinitis 10/03/2007   Osteoarthritis 10/03/2007   Osteoporosis 10/03/2007    Past Surgical History:  Procedure Laterality Date   BASAL CELL CARCINOMA EXCISION  12/2016   neck    EYE SURGERY     Bilateral cataract Surgery   SQUAMOUS CELL CARCINOMA EXCISION  12/2016   neck   TONSILLECTOMY     TOOTH EXTRACTION      OB History   No obstetric history on file.      Home Medications    Prior to Admission medications   Medication Sig Start Date End Date Taking? Authorizing Provider  cephALEXin  (KEFLEX ) 500 MG capsule Take 1 capsule (500 mg total) by mouth 3 (three) times daily for 5 days. 08/08/24 08/13/24 Yes Corlis Burnard DEL, NP  acetaminophen (TYLENOL) 650 MG CR tablet Take 650 mg by mouth every 8 (eight) hours as needed. Alternates with Ibuprofen.    [provider]  apixaban  (ELIQUIS ) 5 MG TABS tablet Take 1 tablet (5 mg total) by mouth 2 (two) times daily. 10/06/23   Darliss Rogue, MD  bisoprolol -hydrochlorothiazide  (ZIAC ) 2.5-6.25 MG tablet TAKE 1 TABLET EVERY DAY 01/25/24   Tower, Laine LABOR, MD  calcium carbonate (OS-CAL) 600 MG TABS Take 600 mg by mouth daily.    [provider]  chlorpheniramine (CHLOR-TRIMETON) 4 MG tablet Take 4 mg by mouth 2 (two) times daily as needed.    [provider]  cholecalciferol (VITAMIN D ) 1000 UNITS tablet Take 2,000 Units by mouth daily.    [provider]  GARLIC PO Take 1 capsule by mouth daily.    [provider]  hydrochlorothiazide  (HYDRODIURIL ) 25 MG tablet TAKE 1 TABLET  EVERY DAY 02/12/24   Tower, Laine LABOR, MD  levocetirizine (XYZAL) 5 MG tablet Take 5 mg by mouth daily.    [provider]  Lifitegrast 5 % SOLN Place 1 drop into both eyes 2 (two) times a day.    [provider]  Multiple Vitamins-Minerals (PRESERVISION AREDS 2) CAPS Take 1 capsule by mouth 2 (two) times a day.    [provider]  phenylephrine (NEO-SYNEPHRINE) 0.125 % nasal drops Place 1 drop into the nose as needed.    [provider]  Polyethyl Glycol-Propyl Glycol (SYSTANE OP) Apply to eye 2 (two) times daily as needed.    [provider]  timolol (BETIMOL) 0.5 %  ophthalmic solution Place 1 drop into both eyes daily.    [provider]  verapamil  (CALAN -SR) 120 MG CR tablet TAKE 1 TABLET AT BEDTIME 07/12/24   Darliss Rogue, MD    Family History Family History  Problem Relation Age of Onset   Cancer Father        Kidney   Heart disease Father    Cancer Paternal Grandmother    Dementia Mother    Hypertension Mother    Hypertension Brother    Heart disease Maternal Uncle    Stroke Maternal Grandmother    Hypertension Maternal Grandmother    Heart disease Maternal Grandfather    Breast cancer Neg Hx     Social History Social History   Tobacco Use   Smoking status: Never   Smokeless tobacco: Never  Vaping Use   Vaping status: Never Used  Substance Use Topics   Alcohol use: No   Drug use: No     Allergies   Patient has no known allergies.   Review of Systems Review of Systems  Constitutional:  Negative for chills and fever.  Gastrointestinal:  Negative for abdominal pain.  Genitourinary:  Positive for dysuria and frequency. Negative for flank pain and hematuria.     Physical Exam Triage Vital Signs ED Triage Vitals [08/08/24 0911]  Encounter Vitals Group     BP 139/81     Girls Systolic BP Percentile      Girls Diastolic BP Percentile      Boys Systolic BP Percentile      Boys Diastolic BP Percentile      Pulse Rate 63     Resp 18     Temp 97.9 F (36.6 C)     Temp src      SpO2 97 %     Weight      Height      Head Circumference      Peak Flow      Pain Score      Pain Loc      Pain Education      Exclude from Growth Chart    No data found.  Updated Vital Signs BP 139/81   Pulse 63   Temp 97.9 F (36.6 C)   Resp 18   SpO2 97%   Visual Acuity Right Eye Distance:   Left Eye Distance:   Bilateral Distance:    Right Eye Near:   Left Eye Near:    Bilateral Near:     Physical Exam Constitutional:      General: She is not in acute distress. HENT:     Mouth/Throat:     Mouth:  Mucous membranes are moist.  Cardiovascular:     Rate and Rhythm: Normal rate.  Pulmonary:     Effort: Pulmonary effort  is normal. No respiratory distress.  Abdominal:     General: Bowel sounds are normal.     Palpations: Abdomen is soft.     Tenderness: There is no abdominal tenderness. There is no right CVA tenderness, left CVA tenderness, guarding or rebound.  Neurological:     Mental Status: She is alert.      UC Treatments / Results  Labs (all labs ordered are listed, but only abnormal results are displayed) Labs Reviewed  POCT URINE DIPSTICK - Abnormal; Notable for the following components:      Result Value   Clarity, UA turbid (*)    Blood, UA moderate (*)    Leukocytes, UA Moderate (2+) (*)    All other components within normal limits  URINE CULTURE    EKG   Radiology No results found.  Procedures Procedures (including critical care time)  Medications Ordered in UC Medications - No data to display  Initial Impression / Assessment and Plan / UC Course  I have reviewed the triage vital signs and the nursing notes.  Pertinent labs & imaging results that were available during my care of the patient were reviewed by me and considered in my medical decision making (see chart for details).   Dysuria.  Afebrile and vital signs are stable.  Treating with Keflex. Urine culture pending. Discussed with patient that we will call her if the urine culture shows the need to change or discontinue the antibiotic. Instructed her to follow-up with her PCP if her symptoms are not improving. Patient agrees to plan of care.    Final Clinical Impressions(s) / UC Diagnoses   Final diagnoses:  Dysuria     Discharge Instructions      Take the antibiotic as directed.  The urine culture is pending.  We will call you if it shows the need to change or discontinue your antibiotic.    Follow-up with your primary care provider if your symptoms are not improving.      ED  Prescriptions     Medication Sig Dispense Auth. Provider   cephALEXin (KEFLEX) 500 MG capsule Take 1 capsule (500 mg total) by mouth 3 (three) times daily for 5 days. 15 capsule Corlis Burnard DEL, NP      PDMP not reviewed this encounter.   Corlis Burnard DEL, NP 08/08/24 0930

## 2024-08-08 NOTE — Discharge Instructions (Addendum)
 Take the antibiotic as directed.  The urine culture is pending.  We will call you if it shows the need to change or discontinue your antibiotic.    Follow up with your primary care provider if your symptoms are not improving.

## 2024-08-08 NOTE — ED Triage Notes (Signed)
 Patient to Urgent Care with complaints of urinary frequency/ bladder pressure/ discomfort when urination.  Symptoms started Monday.   No otc meds.

## 2024-08-10 LAB — URINE CULTURE: Culture: >=100000 — AB

## 2024-08-12 ENCOUNTER — Ambulatory Visit (HOSPITAL_COMMUNITY): Payer: Self-pay

## 2024-09-11 ENCOUNTER — Other Ambulatory Visit: Payer: Self-pay

## 2024-09-11 DIAGNOSIS — I480 Paroxysmal atrial fibrillation: Secondary | ICD-10-CM

## 2024-10-06 ENCOUNTER — Other Ambulatory Visit: Payer: Self-pay | Admitting: Cardiology

## 2024-10-06 ENCOUNTER — Other Ambulatory Visit: Payer: Self-pay | Admitting: Family Medicine

## 2024-11-21 ENCOUNTER — Ambulatory Visit

## 2024-11-22 ENCOUNTER — Ambulatory Visit

## 2024-12-03 ENCOUNTER — Encounter: Admitting: Family Medicine

## 2024-12-03 ENCOUNTER — Ambulatory Visit

## 2025-01-02 ENCOUNTER — Ambulatory Visit: Admitting: Dermatology
